# Patient Record
Sex: Male | Born: 1955 | Race: White | Hispanic: No | Marital: Single | Smoking: Former smoker
Health system: Southern US, Community
[De-identification: ages and names within clinical notes are randomized; demographics above are authoritative.]

## PROBLEM LIST (undated history)

## (undated) DIAGNOSIS — I509 Heart failure, unspecified: Secondary | ICD-10-CM

## (undated) DIAGNOSIS — Z87442 Personal history of urinary calculi: Secondary | ICD-10-CM

## (undated) DIAGNOSIS — Z9081 Acquired absence of spleen: Secondary | ICD-10-CM

## (undated) DIAGNOSIS — K759 Inflammatory liver disease, unspecified: Secondary | ICD-10-CM

## (undated) DIAGNOSIS — M1990 Unspecified osteoarthritis, unspecified site: Secondary | ICD-10-CM

## (undated) DIAGNOSIS — N189 Chronic kidney disease, unspecified: Secondary | ICD-10-CM

## (undated) DIAGNOSIS — K219 Gastro-esophageal reflux disease without esophagitis: Secondary | ICD-10-CM

## (undated) DIAGNOSIS — M549 Dorsalgia, unspecified: Secondary | ICD-10-CM

## (undated) DIAGNOSIS — M722 Plantar fascial fibromatosis: Secondary | ICD-10-CM

## (undated) DIAGNOSIS — I10 Essential (primary) hypertension: Secondary | ICD-10-CM

## (undated) DIAGNOSIS — I739 Peripheral vascular disease, unspecified: Secondary | ICD-10-CM

## (undated) DIAGNOSIS — E7800 Pure hypercholesterolemia, unspecified: Secondary | ICD-10-CM

## (undated) HISTORY — PX: HERNIA REPAIR: SHX51

## (undated) HISTORY — PX: SPLENECTOMY: SUR1306

## (undated) HISTORY — PX: JOINT REPLACEMENT: SHX530

---

## 2007-07-13 ENCOUNTER — Other Ambulatory Visit: Payer: Self-pay

## 2007-07-13 ENCOUNTER — Emergency Department: Payer: Self-pay | Admitting: Emergency Medicine

## 2008-09-11 ENCOUNTER — Emergency Department: Payer: Self-pay | Admitting: Emergency Medicine

## 2011-06-18 ENCOUNTER — Ambulatory Visit: Payer: Self-pay | Admitting: Surgery

## 2011-06-18 LAB — BASIC METABOLIC PANEL
Anion Gap: 10 (ref 7–16)
Calcium, Total: 9.1 mg/dL (ref 8.5–10.1)
Chloride: 97 mmol/L — ABNORMAL LOW (ref 98–107)
Co2: 31 mmol/L (ref 21–32)
Creatinine: 0.99 mg/dL (ref 0.60–1.30)
EGFR (African American): >60
EGFR (Non-African Amer.): >60
Osmolality: 276 (ref 275–301)

## 2011-06-18 LAB — CBC WITH DIFFERENTIAL/PLATELET
Basophil #: 0.2 10*3/uL — ABNORMAL HIGH (ref 0.0–0.1)
Basophil %: 2.3 %
Eosinophil %: 4.9 %
HGB: 15.2 g/dL (ref 13.0–18.0)
Lymphocyte %: 42.5 %
MCH: 30.6 pg (ref 26.0–34.0)
MCHC: 33.5 g/dL (ref 32.0–36.0)
MCV: 91 fL (ref 80–100)
Monocyte #: 0.9 10*3/uL — ABNORMAL HIGH (ref 0.0–0.7)
Monocyte %: 9.1 %

## 2011-06-25 ENCOUNTER — Ambulatory Visit: Payer: Self-pay | Admitting: Surgery

## 2014-09-05 NOTE — Op Note (Signed)
PATIENT NAME:  URIAN, DIBERARDINO MR#:  [id number] DATE OF BIRTH:  Apr 10, 1956  DATE OF PROCEDURE:  06/25/2011  PREOPERATIVE DIAGNOSIS: Symptomatic ventral hernia.   POSTOPERATIVE DIAGNOSIS: Symptomatic ventral hernia.    PROCEDURE PERFORMED: Ventral hernia repair with mesh 6.4 cm Ventralex dual mesh.   SURGEON: Natale Lay, M.D.   ANESTHESIA: General with local.   FINDINGS: 2 x 3 cm fascial defect with incarcerated preperitoneal fat.   DESCRIPTION OF PROCEDURE: With the patient in the supine position, general endotracheal anesthesia was induced. The patient's abdomen was clipped of hair preoperatively, prepped and draped with ChloraPrep followed by Collier Flowers. Perioperative antibiotics and deep vein thrombosis prophylaxis being administered. Time-out procedure was observed. The hernia was centered around the umbilicus. A vertical incision was fashioned to the right of the umbilicus with a scalpel. The hernia sac was immediately identified and reduced back into the abdomen. Circumferentially on the anterior surface of the fascia, the fascia was separated from the subcutaneous fat. The hernia defect measured 2 x 3 cm. A 6.4 cm oval Ventralex patch was brought into the field and inserted into the hernia defect below the fascia and secured with multiple mattress-type sutures of 0 Vicryl suture. The fascia easily reapproximated to the midline in a transverse orientation with figure-of-eight #0 Vicryl sutures. The wound was irrigated with GU irrigant. Hemostasis was obtained with point cautery. The deep layer of the wound was obliterated utilizing 2-0 Vicryl suture. The umbilical dermis was then reattached to the fascia with a figure-of-eight 2-0 Vicryl suture. Deep dermal inverted 3-0 Vicryls were then placed. Skin stapler was used to reapproximate skin edges. Sterile occlusive compressive dressing was placed followed by an abdominal binder.  ____________________________ Redge Gainer. Egbert Garibaldi, MD mab:ap D: 06/25/2011  08:42:56 ET              T: 06/25/2011 09:47:43 ET                 JOB#: [id number] Kolbe Delmonaco A Cierria Height MD ELECTRONICALLY SIGNED 06/25/2011 17:05

## 2015-01-06 ENCOUNTER — Inpatient Hospital Stay: Admission: RE | Admit: 2015-01-06 | Payer: Self-pay | Source: Ambulatory Visit

## 2015-01-12 ENCOUNTER — Encounter
Admission: RE | Admit: 2015-01-12 | Discharge: 2015-01-12 | Disposition: A | Discharge status: Home / Self Care | Payer: Medicaid Other | Source: Ambulatory Visit | Attending: Surgery | Admitting: Surgery

## 2015-01-12 DIAGNOSIS — Z01810 Encounter for preprocedural cardiovascular examination: Secondary | ICD-10-CM | POA: Diagnosis present

## 2015-01-12 DIAGNOSIS — Z01812 Encounter for preprocedural laboratory examination: Secondary | ICD-10-CM | POA: Insufficient documentation

## 2015-01-12 HISTORY — DX: Essential (primary) hypertension: I10

## 2015-01-12 HISTORY — DX: Gastro-esophageal reflux disease without esophagitis: K21.9

## 2015-01-12 HISTORY — DX: Plantar fascial fibromatosis: M72.2

## 2015-01-12 HISTORY — DX: Inflammatory liver disease, unspecified: K75.9

## 2015-01-12 HISTORY — DX: Acquired absence of spleen: Z90.81

## 2015-01-12 HISTORY — DX: Pure hypercholesterolemia, unspecified: E78.00

## 2015-01-12 HISTORY — DX: Dorsalgia, unspecified: M54.9

## 2015-01-12 LAB — PROTIME-INR
INR: 1.02
Prothrombin Time: 13.6 seconds (ref 11.4–15.0)

## 2015-01-12 NOTE — Patient Instructions (Signed)
  Your procedure is scheduled on: Friday 01/14/2015 Report to Day Surgery. 2nd floor medical mall entrance To find out your arrival time please call [phone] between 1PM - 3PM on Thursday 01/13/2015.  Remember: Instructions that are not followed completely may result in serious medical risk, up to and including death, or upon the discretion of your surgeon and anesthesiologist your surgery may need to be rescheduled.    __X__ 1. Do not eat food or drink liquids after midnight. No gum chewing or hard candies.     __X_ 2. No Alcohol for 24 hours before or after surgery.   ____ 3. Bring all medications with you on the day of surgery if instructed.    __X__ 4. Notify your doctor if there is any change in your medical condition     (cold, fever, infections).     Do not wear jewelry, make-up, hairpins, clips or nail polish.  Do not wear lotions, powders, or perfumes.   Do not shave 48 hours prior to surgery. Men may shave face and neck.  Do not bring valuables to the hospital.    [facility] is not responsible for any belongings or valuables.               Contacts, dentures or bridgework may not be worn into surgery.  Leave your suitcase in the car. After surgery it may be brought to your room.  For patients admitted to the hospital, discharge time is determined by your                treatment team.   Patients discharged the day of surgery will not be allowed to drive home.   Please read over the following fact sheets that you were given:   Surgical Site Infection Prevention   __X__ Take these medicines the morning of surgery with A SIP OF WATER:    1. OMEPRAZOLE  2. OXYCODONE  3. METHADONE  4.  5.  6.  ____ Fleet Enema (as directed)   __X__ Use CHG Soap as directed  ____ Use inhalers on the day of surgery  ____ Stop metformin 2 days prior to surgery    ____ Take 1/2 of usual insulin dose the night before surgery and none on the morning of surgery.   ____ Stop  Coumadin/Plavix/aspirin on   ____ Stop Anti-inflammatories on    ____ Stop supplements until after surgery.    ____ Bring C-Pap to the hospital.

## 2015-01-14 ENCOUNTER — Ambulatory Visit: Admission: RE | Admit: 2015-01-14 | Payer: Medicaid Other | Source: Ambulatory Visit | Admitting: Surgery

## 2015-01-14 ENCOUNTER — Encounter: Admission: RE | Payer: Self-pay | Source: Ambulatory Visit

## 2015-01-14 SURGERY — EXCISION MASS
Anesthesia: Choice | Laterality: Right

## 2015-12-28 DIAGNOSIS — Z6841 Body Mass Index (BMI) 40.0 and over, adult: Secondary | ICD-10-CM

## 2016-03-08 ENCOUNTER — Ambulatory Visit: Payer: Medicaid Other

## 2016-06-30 ENCOUNTER — Emergency Department
Admission: EM | Admit: 2016-06-30 | Discharge: 2016-06-30 | Disposition: A | Discharge status: Home / Self Care | Payer: Medicaid Other | Attending: Emergency Medicine | Admitting: Emergency Medicine

## 2016-06-30 ENCOUNTER — Encounter: Payer: Self-pay | Admitting: Emergency Medicine

## 2016-06-30 DIAGNOSIS — M79672 Pain in left foot: Secondary | ICD-10-CM | POA: Insufficient documentation

## 2016-06-30 DIAGNOSIS — M545 Low back pain, unspecified: Secondary | ICD-10-CM

## 2016-06-30 DIAGNOSIS — Z79899 Other long term (current) drug therapy: Secondary | ICD-10-CM | POA: Diagnosis not present

## 2016-06-30 DIAGNOSIS — G8929 Other chronic pain: Secondary | ICD-10-CM | POA: Diagnosis not present

## 2016-06-30 DIAGNOSIS — F17200 Nicotine dependence, unspecified, uncomplicated: Secondary | ICD-10-CM | POA: Insufficient documentation

## 2016-06-30 DIAGNOSIS — I10 Essential (primary) hypertension: Secondary | ICD-10-CM | POA: Insufficient documentation

## 2016-06-30 DIAGNOSIS — M79671 Pain in right foot: Secondary | ICD-10-CM | POA: Diagnosis not present

## 2016-06-30 MED ORDER — DEXAMETHASONE SODIUM PHOSPHATE 10 MG/ML IJ SOLN
10.0000 mg | Freq: Once | INTRAMUSCULAR | Status: AC
  Administered 2016-06-30: 10 mg via INTRAMUSCULAR

## 2016-06-30 MED ORDER — DEXAMETHASONE SODIUM PHOSPHATE 10 MG/ML IJ SOLN
INTRAMUSCULAR | Status: AC
  Administered 2016-06-30: 10 mg via INTRAMUSCULAR
  Filled 2016-06-30: qty 1

## 2016-06-30 MED ORDER — PREDNISONE 10 MG PO TABS
ORAL_TABLET | ORAL | 0 refills | Status: DC
Start: 2016-06-30 — End: 2016-11-30
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
## 2016-06-30 NOTE — ED Notes (Signed)

## 2016-06-30 NOTE — Discharge Instructions (Signed)
You will need to call your doctor for any further pain medication. Begin taking prednisone as directed starting with 6 pills and tapering down to 1 on the last day. Continue your regular medication as prescribed. Keep your prescription to be filled on February 26 and take to your local pharmacy.
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
## 2016-07-04 ENCOUNTER — Emergency Department: Payer: Medicaid Other

## 2016-07-04 ENCOUNTER — Emergency Department
Admission: EM | Admit: 2016-07-04 | Discharge: 2016-07-04 | Discharge status: Against Medical Advice | Payer: Medicaid Other | Attending: Student in an Organized Health Care Education/Training Program | Admitting: Student in an Organized Health Care Education/Training Program

## 2016-07-04 ENCOUNTER — Encounter: Payer: Self-pay | Admitting: Intensive Care

## 2016-07-04 DIAGNOSIS — I10 Essential (primary) hypertension: Secondary | ICD-10-CM | POA: Diagnosis not present

## 2016-07-04 DIAGNOSIS — R1084 Generalized abdominal pain: Secondary | ICD-10-CM | POA: Diagnosis present

## 2016-07-04 DIAGNOSIS — F17290 Nicotine dependence, other tobacco product, uncomplicated: Secondary | ICD-10-CM | POA: Insufficient documentation

## 2016-07-04 DIAGNOSIS — F1110 Opioid abuse, uncomplicated: Secondary | ICD-10-CM | POA: Insufficient documentation

## 2016-07-04 DIAGNOSIS — Z79899 Other long term (current) drug therapy: Secondary | ICD-10-CM | POA: Diagnosis not present

## 2016-07-04 DIAGNOSIS — R109 Unspecified abdominal pain: Secondary | ICD-10-CM

## 2016-07-04 LAB — CBC WITH DIFFERENTIAL/PLATELET
BASOS ABS: 0.1 10*3/uL (ref 0–0.1)
Basophils Relative: 2 %
EOS PCT: 0 %
Eosinophils Absolute: 0 10*3/uL (ref 0–0.7)
HEMATOCRIT: 45.6 % (ref 40.0–52.0)
Hemoglobin: 15.6 g/dL (ref 13.0–18.0)
LYMPHS PCT: 39 %
Lymphs Abs: 3.7 10*3/uL — ABNORMAL HIGH (ref 1.0–3.6)
MCH: 30.3 pg (ref 26.0–34.0)
MCHC: 34.3 g/dL (ref 32.0–36.0)
MCV: 88.5 fL (ref 80.0–100.0)
Monocytes Absolute: 1 10*3/uL (ref 0.2–1.0)
Monocytes Relative: 11 %
NEUTROS ABS: 4.8 10*3/uL (ref 1.4–6.5)
Neutrophils Relative %: 48 %
PLATELETS: 368 10*3/uL (ref 150–440)
RBC: 5.15 MIL/uL (ref 4.40–5.90)
RDW: 14 % (ref 11.5–14.5)
WBC: 9.7 10*3/uL (ref 3.8–10.6)

## 2016-07-04 LAB — COMPREHENSIVE METABOLIC PANEL
ALT: 92 U/L — AB (ref 17–63)
AST: 93 U/L — AB (ref 15–41)
Albumin: 4.1 g/dL (ref 3.5–5.0)
Alkaline Phosphatase: 61 U/L (ref 38–126)
Anion gap: 20 — ABNORMAL HIGH (ref 5–15)
BUN: 20 mg/dL (ref 6–20)
CALCIUM: 9 mg/dL (ref 8.9–10.3)
CHLORIDE: 94 mmol/L — AB (ref 101–111)
CO2: 19 mmol/L — ABNORMAL LOW (ref 22–32)
Creatinine, Ser: 0.78 mg/dL (ref 0.61–1.24)
GFR calc Af Amer: >60 mL/min (ref >60)
Glucose, Bld: 101 mg/dL — ABNORMAL HIGH (ref 65–99)
Potassium: 3.6 mmol/L (ref 3.5–5.1)
Sodium: 133 mmol/L — ABNORMAL LOW (ref 135–145)
TOTAL PROTEIN: 7.5 g/dL (ref 6.5–8.1)
Total Bilirubin: 0.8 mg/dL (ref 0.3–1.2)

## 2016-07-04 LAB — LIPASE, BLOOD: Lipase: 86 U/L — ABNORMAL HIGH (ref 11–51)

## 2016-07-04 MED ORDER — SODIUM CHLORIDE 0.9 % IV BOLUS (SEPSIS)
500.0000 mL | Freq: Once | INTRAVENOUS | Status: AC
  Administered 2016-07-04: 500 mL via INTRAVENOUS

## 2016-07-04 MED ORDER — PROMETHAZINE HCL 25 MG/ML IJ SOLN
12.5000 mg | Freq: Once | INTRAMUSCULAR | Status: AC
  Administered 2016-07-04: 12.5 mg via INTRAVENOUS
  Filled 2016-07-04: qty 1

## 2016-07-04 MED ORDER — MORPHINE SULFATE (PF) 4 MG/ML IV SOLN
8.0000 mg | INTRAVENOUS | Status: DC | PRN
Start: 2016-07-04 — End: 2016-07-04
  Administered 2016-07-04: 8 mg via INTRAVENOUS
  Filled 2016-07-04: qty 2

## 2016-07-04 MED ORDER — HALOPERIDOL LACTATE 5 MG/ML IJ SOLN
10.0000 mg | Freq: Once | INTRAMUSCULAR | Status: AC
  Administered 2016-07-04: 10 mg via INTRAMUSCULAR
  Filled 2016-07-04: qty 2

## 2016-07-04 NOTE — ED Triage Notes (Signed)
PAtient arrived by EMS from home for c/o L sided flank pain since 11am. HX kidney stones and pt reports this feels the same. EMS gave fentanyl and 15mg  toradol. EMS vitals b/p 158/88, HR 90, O297, blood sugar 95. A&O x3. Pt in fetal position in bed c/o 10 out of 10 pain

## 2016-07-04 NOTE — ED Notes (Signed)
AAOx3. MAE equally and strong.  Patient states he was "confused earlier when he said his left side  / low back was hurting, and that his pain is actually to his right lower back / flank".

## 2016-07-04 NOTE — ED Provider Notes (Signed)
[facility] Emergency Department Provider Note    First MD Initiated Contact with Patient 07/04/16 1034     (approximate)  I have reviewed the triage vital signs and the nursing notes.   HISTORY  Chief Complaint Flank Pain    HPI Joshua Petersen is a 61 y.o. male with a history of chronic back pain with extensive history of narcotic abuse and addiction presenting today with 24 hours of generalized flank pain that he states is consistent with kidney stones that he had in 2011. Denies any nausea or vomiting. No chest pain. States it hurts every position. No dysuria or hematuria. He is unable to characterize how this is different from his chronic back pain. No new numbness or tingling. No reported trauma.  On review of patient's medical record he has been in by multiple practitioners in pain clinic and being weaned off of 30 mg every 8 oxycodone. Also having significant family stressors is currently unemployed due to a chronic debility due to chronic back pain.  [place] PMP:   1/26 - 120 tabs of 30mg  Roxicodone filled 1/26 - 30 tabs 10mg  Methadone   From Office visit W/ Dr. Zada Finders 06/29/16 Chronic bilateral low back pain with bilateral sciatica The patient reports that he has concerned about the medications that recently were renewed for him. Currently on methadone 30 mg that he is supposed to take 3 times a day as reported by the patient but he said that he has only received a total of 30 tablets that will last him only once a day, in addition he is on oxycodone 30 mg that he also takes every 6 hours as needed. The patient was encouraged to contact his pain specialist to clarify this. Unable to tell by the most recent visit to the pain clinic about the changes on his medications. He is also 1 to get surgery for his low back pain. He said in the past he was told that this may or may not help with the back pain, he said he is willing to try it. A referral has been  requested. To help with the pain as he reports shortness of medications a prescription for naproxen to take twice a day was sent to the pharmacy. He was also reminded to make appointments to discuss other medical issues, particularly make an appointment to get a physical. - Ambulatory Referral to Neurology - naproxen (NAPROSYN) 500 MG tablet; Take 1 tablet (500 mg total) by mouth 2 (two) times daily with meals.       Past Medical History:  Diagnosis Date  . Back pain   . GERD (gastroesophageal reflux disease)   . H/O splenectomy   . Hepatitis    hepatitis C  . High cholesterol   . Hypertension   . Plantar fasciitis    No family history on file. Past Surgical History:  Procedure Laterality Date  . HERNIA REPAIR     umbilical   There are no active problems to display for this patient.     Prior to Admission medications   Medication Sig Start Date End Date Taking? Authorizing Provider  albuterol (PROVENTIL HFA;VENTOLIN HFA) 108 (90 BASE) MCG/ACT inhaler Inhale 2 puffs into the lungs every 6 (six) hours as needed for wheezing or shortness of breath.    Historical Provider, MD  atorvastatin (LIPITOR) 10 MG tablet Take 10 mg by mouth at bedtime.    Historical Provider, MD  lisinopril-hydrochlorothiazide (PRINZIDE,ZESTORETIC) 20-12.5 MG per tablet Take 1  tablet by mouth daily.    Historical Provider, MD  methadone (DOLOPHINE) 10 MG tablet Take 40 mg by mouth every 8 (eight) hours.    Historical Provider, MD  omeprazole (PRILOSEC) 20 MG capsule Take 20 mg by mouth daily.    Historical Provider, MD  oxycodone (ROXICODONE) 30 MG immediate release tablet Take 60 mg by mouth 3 (three) times daily.    Historical Provider, MD  predniSONE (DELTASONE) 10 MG tablet Take 6 tablets  today, on day 2 take 5 tablets, day 3 take 4 tablets, day 4 take 3 tablets, day 5 take  2 tablets and 1 tablet the last day 06/30/16   Tommi Rumps, PA-C    Allergies Patient has no known  allergies.    Social History Social History  Substance Use Topics  . Smoking status: Former Games developer  . Smokeless tobacco: Current User  . Alcohol use Yes     Comment: occasional    Review of Systems Patient denies headaches, rhinorrhea, blurry vision, numbness, shortness of breath, chest pain, edema, cough, abdominal pain, nausea, vomiting, diarrhea, dysuria, fevers, rashes or hallucinations unless otherwise stated above in HPI. ____________________________________________   PHYSICAL EXAM:  VITAL SIGNS: Vitals:   07/04/16 1040  BP: (!) 169/136  Pulse: (!) 115  Resp: 20  Temp: 98.2 F (36.8 C)    Constitutional: Alert and oriented. Uncomfortable appearing thrashing around in bed while in room but in acute distress when no one else in the room Eyes: Conjunctivae are normal. PERRL. EOMI. Head: Atraumatic. Nose: No congestion/rhinnorhea. Mouth/Throat: Mucous membranes are moist.  Oropharynx non-erythematous. Neck: No stridor. Painless ROM. No cervical spine tenderness to palpation Hematological/Lymphatic/Immunilogical: No cervical lymphadenopathy. Cardiovascular: Normal rate, regular rhythm. Grossly normal heart sounds.  Good peripheral circulation. Respiratory: Normal respiratory effort.  No retractions. Lungs CTAB. Gastrointestinal: Soft and nontender. No distention. No abdominal bruits. No CVA tenderness. Genitourinary:  Musculoskeletal: No lower extremity tenderness nor edema.  No joint effusions. Neurologic:  Normal speech and language. No gross focal neurologic deficits are appreciated. No gait instability. Skin:  Skin is warm, dry and intact. No rash noted.  ____________________________________________   LABS (all labs ordered are listed, but only abnormal results are displayed)  Results for orders placed or performed during the hospital encounter of 07/04/16 (from the past 24 hour(s))  CBC with Differential/Platelet     Status: Abnormal   Collection Time:  07/04/16 10:56 AM  Result Value Ref Range   WBC 9.7 3.8 - 10.6 K/uL   RBC 5.15 4.40 - 5.90 MIL/uL   Hemoglobin 15.6 13.0 - 18.0 g/dL   HCT 16.1 09.6 - 04.5 %   MCV 88.5 80.0 - 100.0 fL   MCH 30.3 26.0 - 34.0 pg   MCHC 34.3 32.0 - 36.0 g/dL   RDW 40.9 81.1 - 91.4 %   Platelets 368 150 - 440 K/uL   Neutrophils Relative % 48 %   Neutro Abs 4.8 1.4 - 6.5 K/uL   Lymphocytes Relative 39 %   Lymphs Abs 3.7 (H) 1.0 - 3.6 K/uL   Monocytes Relative 11 %   Monocytes Absolute 1.0 0.2 - 1.0 K/uL   Eosinophils Relative 0 %   Eosinophils Absolute 0.0 0 - 0.7 K/uL   Basophils Relative 2 %   Basophils Absolute 0.1 0 - 0.1 K/uL  Comprehensive metabolic panel     Status: Abnormal   Collection Time: 07/04/16 10:56 AM  Result Value Ref Range   Sodium 133 (L) 135 - 145 mmol/L  Potassium 3.6 3.5 - 5.1 mmol/L   Chloride 94 (L) 101 - 111 mmol/L   CO2 19 (L) 22 - 32 mmol/L   Glucose, Bld 101 (H) 65 - 99 mg/dL   BUN 20 6 - 20 mg/dL   Creatinine, Ser 8.26 0.61 - 1.24 mg/dL   Calcium 9.0 8.9 - 41.5 mg/dL   Total Protein 7.5 6.5 - 8.1 g/dL   Albumin 4.1 3.5 - 5.0 g/dL   AST 93 (H) 15 - 41 U/L   ALT 92 (H) 17 - 63 U/L   Alkaline Phosphatase 61 38 - 126 U/L   Total Bilirubin 0.8 0.3 - 1.2 mg/dL   GFR calc non Af Amer >60 >60 mL/min   GFR calc Af Amer >60 >60 mL/min   Anion gap 20 (H) 5 - 15  Lipase, blood     Status: Abnormal   Collection Time: 07/04/16 10:56 AM  Result Value Ref Range   Lipase 86 (H) 11 - 51 U/L   ____________________________________________  ____________________________________________  RADIOLOGY  I personally reviewed all radiographic images ordered to evaluate for the above acute complaints and reviewed radiology reports and findings.  These findings were personally discussed with the patient.  Please see medical record for radiology report.  ____________________________________________   PROCEDURES  Procedure(s) performed:  Procedures    Critical Care performed:  no ____________________________________________   INITIAL IMPRESSION / ASSESSMENT AND PLAN / ED COURSE  Pertinent labs & imaging results that were available during my care of the patient were reviewed by me and considered in my medical decision making (see chart for details).  DDX: stone, AAA, msk strain, chronic back pain  Joshua Petersen is a 61 y.o. who presents to the ED with chronic back pain p/w left flank pain. No fevers, no systemic symptoms.  urinary symptoms. Denies trauma or injury. Afebrile in ED. Exam as above. Flank TTP, otherwise abdominal exam is benign. No peritoneal signs. Possible kidney stone, cystitis, or pyelonephritis.   Checking urine. UA pending  CT Stone with No stone or AAA.  No acute abnormality Clinical picture is not consistent with appendicitis, diverticulitis, pancreatitis, cholecystitis, bowel perforation, aortic dissection, splenic injury or acute abdominal process at this time.    Clinical Course as of Jul 05 1199  Wed Jul 04, 2016  1200 CT imaging without any evidence of acute abnormality. While checking on the patient I noticed the patient was ambulating throughout the hall in no acute distress. He ripped out his IV. Encouraged the patient to stay for further evaluation the patient refused. Patient upset that we're not providing any narcotic pain medication. I said we did provide narcotic medication. To suspect a large component of this is secondary to significant narcotic abuse. Patient otherwise medically stable, well perfused and in no acute distress. Patient left AGAINST MEDICAL ADVICE. Encouraged patient to return should he want one further evaluation.  [PR]    Clinical Course User Index [PR] Willy Eddy, MD     ____________________________________________   FINAL CLINICAL IMPRESSION(S) / ED DIAGNOSES  Final diagnoses:  Left flank pain  Narcotic abuse, continuous      NEW MEDICATIONS STARTED DURING THIS VISIT:  New Prescriptions    No medications on file     Note:  This document was prepared using Dragon voice recognition software and may include unintentional dictation errors.    Willy Eddy, MD 07/04/16 [phone]

## 2016-07-04 NOTE — ED Notes (Signed)
Call light answered. Pt wants something for pain. RN notified.

## 2016-07-04 NOTE — ED Notes (Signed)
Patient pulled out IV and stated "I am leaving".  Multiple staff attempted to stop patient from walking out.  DSD dressing applied to IV site.  Dr. Roxan Hockey alerted to patient's decision to leave AMA.  No further action ordered.  Patient refused to sign AMA form.

## 2016-08-29 ENCOUNTER — Other Ambulatory Visit: Payer: Self-pay | Admitting: Neurological Surgery

## 2016-08-29 DIAGNOSIS — G8929 Other chronic pain: Secondary | ICD-10-CM

## 2016-08-29 DIAGNOSIS — M545 Low back pain: Principal | ICD-10-CM

## 2016-09-07 ENCOUNTER — Ambulatory Visit
Admission: RE | Admit: 2016-09-07 | Discharge: 2016-09-07 | Disposition: A | Discharge status: Home / Self Care | Payer: Medicaid Other | Source: Ambulatory Visit | Attending: Neurological Surgery | Admitting: Neurological Surgery

## 2016-09-07 DIAGNOSIS — G8929 Other chronic pain: Secondary | ICD-10-CM

## 2016-09-07 DIAGNOSIS — M545 Low back pain, unspecified: Secondary | ICD-10-CM

## 2016-09-07 DIAGNOSIS — M5136 Other intervertebral disc degeneration, lumbar region: Secondary | ICD-10-CM | POA: Diagnosis not present

## 2016-11-30 ENCOUNTER — Encounter: Payer: Self-pay | Admitting: *Deleted

## 2016-11-30 ENCOUNTER — Ambulatory Visit
Admission: EM | Admit: 2016-11-30 | Discharge: 2016-11-30 | Disposition: A | Discharge status: Home / Self Care | Payer: Medicaid Other | Attending: Family Medicine | Admitting: Family Medicine

## 2016-11-30 DIAGNOSIS — G8929 Other chronic pain: Secondary | ICD-10-CM | POA: Diagnosis not present

## 2016-11-30 DIAGNOSIS — M940 Chondrocostal junction syndrome [Tietze]: Secondary | ICD-10-CM | POA: Diagnosis not present

## 2016-11-30 DIAGNOSIS — Z87891 Personal history of nicotine dependence: Secondary | ICD-10-CM | POA: Diagnosis not present

## 2016-11-30 DIAGNOSIS — M545 Low back pain: Secondary | ICD-10-CM | POA: Insufficient documentation

## 2016-11-30 DIAGNOSIS — R0789 Other chest pain: Secondary | ICD-10-CM

## 2016-11-30 DIAGNOSIS — R0602 Shortness of breath: Secondary | ICD-10-CM | POA: Diagnosis not present

## 2016-11-30 MED ORDER — PREDNISONE 20 MG PO TABS: ORAL_TABLET | ORAL | 0 refills | Status: DC

## 2016-11-30 MED ORDER — METHOCARBAMOL 500 MG PO TABS: 500.0000 mg | ORAL_TABLET | Freq: Three times a day (TID) | ORAL | 0 refills | Status: DC | PRN

## 2016-11-30 NOTE — ED Triage Notes (Signed)
Left sided chest pain x8 months. States started when PCP changed his BP meds and has persisted since.

## 2016-11-30 NOTE — ED Provider Notes (Signed)
[facility]    CSN: [id number] Arrival date & time: 11/30/16  0806     History   Chief Complaint Chief Complaint  Patient presents with  . Chest Pain  . Shortness of Breath    HPI Joshua Petersen is a 61 y.o. male.   61 yo male with c/o left sided chest pain "sharp",  "for 8 months", "since they changed my pain medication". Patient c/o chronic pain problems including low back, feet, chest and "pain not controlled". Patient saw his PCP 2 days ago and also states he has an appointment with his pain specialist on Monday (in 3 days).   (Recent PCP and pain specialist notes reviewed in Epic Care)   The history is provided by the patient.  Chest Pain  Associated symptoms: shortness of breath   Shortness of Breath  Associated symptoms: chest pain     Past Medical History:  Diagnosis Date  . Back pain   . GERD (gastroesophageal reflux disease)   . H/O splenectomy   . Hepatitis    hepatitis C  . High cholesterol   . Hypertension   . Plantar fasciitis     There are no active problems to display for this patient.   Past Surgical History:  Procedure Laterality Date  . HERNIA REPAIR     umbilical       Home Medications    Prior to Admission medications   Medication Sig Start Date End Date Taking? Authorizing Provider  lisinopril-hydrochlorothiazide (PRINZIDE,ZESTORETIC) 20-12.5 MG per tablet Take 1 tablet by mouth daily.   Yes [provider]  omeprazole (PRILOSEC) 20 MG capsule Take 20 mg by mouth daily.   Yes [provider]  albuterol (PROVENTIL HFA;VENTOLIN HFA) 108 (90 BASE) MCG/ACT inhaler Inhale 2 puffs into the lungs every 6 (six) hours as needed for wheezing or shortness of breath.    [provider]  methadone (DOLOPHINE) 10 MG tablet Take 10 mg by mouth every 8 (eight) hours.     [provider]  methocarbamol (ROBAXIN) 500 MG tablet Take 1 tablet (500 mg total) by mouth every 8 (eight) hours as needed for  muscle spasms. 11/30/16   Payton Mccallum, MD  oxycodone (ROXICODONE) 30 MG immediate release tablet Take 30 mg by mouth every 6 (six) hours as needed for pain.     [provider]  predniSONE (DELTASONE) 20 MG tablet 3 tabs po qd x 2 days, then 2 tabs po qd x 3 days, then 1 tab po qd x 3 days, then half a tab po qd x 2 days 11/30/16   Payton Mccallum, MD    Family History History reviewed. No pertinent family history.  Social History Social History  Substance Use Topics  . Smoking status: Former Games developer  . Smokeless tobacco: Current User  . Alcohol use Yes     Comment: occasional     Allergies   Patient has no known allergies.   Review of Systems Review of Systems  Respiratory: Positive for shortness of breath.   Cardiovascular: Positive for chest pain.     Physical Exam Triage Vital Signs ED Triage Vitals  Enc Vitals Group     BP 11/30/16 0812 (!) 157/85     Pulse Rate 11/30/16 0812 (!) 116     Resp 11/30/16 0812 20     Temp 11/30/16 0812 97.6 F (36.4 C)     Temp Source 11/30/16 0812 Oral     SpO2 11/30/16 0812 97 %  Weight 11/30/16 0813 292 lb (132.5 kg)     Height 11/30/16 0813 5\' 8"  (1.727 m)     Head Circumference --      Peak Flow --      Pain Score 11/30/16 0813 2     Pain Loc --      Pain Edu? --      Excl. in GC? --    No data found.   Updated Vital Signs BP (!) 157/85 (BP Location: Left Arm)   Pulse (!) 116   Temp 97.6 F (36.4 C) (Oral)   Resp 20   Ht 5\' 8"  (1.727 m)   Wt 292 lb (132.5 kg)   SpO2 97%   BMI 44.40 kg/m   Visual Acuity Right Eye Distance:   Left Eye Distance:   Bilateral Distance:    Right Eye Near:   Left Eye Near:    Bilateral Near:     Physical Exam  Constitutional: He is oriented to person, place, and time. He appears well-developed and well-nourished. No distress.  HENT:  Head: Normocephalic and atraumatic.  Cardiovascular: Regular rhythm, normal heart sounds and intact distal pulses.  Tachycardia  present.   No murmur heard. Pulmonary/Chest: Effort normal and breath sounds normal. No respiratory distress. He has no wheezes. He has no rales. He exhibits tenderness (reproducible chest wall tenderness to palpation over the left chest wall).  Abdominal: Soft. Bowel sounds are normal. He exhibits no distension and no mass. There is no tenderness. There is no rebound and no guarding.  obese  Neurological: He is alert and oriented to person, place, and time.  Skin: No rash noted. He is not diaphoretic.  Nursing note and vitals reviewed.    UC Treatments / Results  Labs (all labs ordered are listed, but only abnormal results are displayed) Labs Reviewed - No data to display  EKG  EKG Interpretation None       Radiology No results found.  Procedures .EKG Date/Time: 11/30/2016 9:05 AM Performed by: Payton Mccallum Authorized by: Payton Mccallum   ECG reviewed by ED Physician in the absence of a cardiologist: yes   Previous ECG:    Previous ECG:  Unavailable Interpretation:    Interpretation: normal   Rate:    ECG rate assessment: tachycardic   Rhythm:    Rhythm: sinus tachycardia   Ectopy:    Ectopy: none   QRS:    QRS axis:  Normal Conduction:    Conduction: normal   ST segments:    ST segments:  Normal T waves:    T waves: normal     (including critical care time)  Medications Ordered in UC Medications - No data to display   Initial Impression / Assessment and Plan / UC Course  I have reviewed the triage vital signs and the nursing notes.  Pertinent labs & imaging results that were available during my care of the patient were reviewed by me and considered in my medical decision making (see chart for details).       Final Clinical Impressions(s) / UC Diagnoses   Final diagnoses:  Chest wall pain  Costochondritis    New Prescriptions Discharge Medication List as of 11/30/2016  8:40 AM    START taking these medications   Details  methocarbamol  (ROBAXIN) 500 MG tablet Take 1 tablet (500 mg total) by mouth every 8 (eight) hours as needed for muscle spasms., Starting Fri 11/30/2016, Normal       1. ekg results and  diagnosis reviewed with patient 2. rx as per orders above; reviewed possible side effects, interactions, risks and benefits; rx for prednisone taper as well  3. Recommend supportive treatment with ice to area  4. Discussed with a patient he already is under pain management with a pain specialist and recommend he keep his follow up appointment with his pain specialist on Monday to discuss his concerns  5. Follow-up with pain specialist   Payton Mccallum, MD 11/30/16 [phone]

## 2017-03-05 ENCOUNTER — Ambulatory Visit
Admission: RE | Admit: 2017-03-05 | Discharge: 2017-03-05 | Disposition: A | Discharge status: Home / Self Care | Payer: Medicaid Other | Source: Ambulatory Visit | Attending: Surgery | Admitting: Surgery

## 2017-03-05 ENCOUNTER — Encounter
Admission: RE | Admit: 2017-03-05 | Discharge: 2017-03-05 | Disposition: A | Discharge status: Home / Self Care | Payer: Medicaid Other | Source: Ambulatory Visit | Attending: Surgery | Admitting: Surgery

## 2017-03-05 DIAGNOSIS — R9431 Abnormal electrocardiogram [ECG] [EKG]: Secondary | ICD-10-CM | POA: Insufficient documentation

## 2017-03-05 DIAGNOSIS — Z01812 Encounter for preprocedural laboratory examination: Secondary | ICD-10-CM | POA: Insufficient documentation

## 2017-03-05 DIAGNOSIS — M1612 Unilateral primary osteoarthritis, left hip: Secondary | ICD-10-CM | POA: Diagnosis not present

## 2017-03-05 DIAGNOSIS — Z01818 Encounter for other preprocedural examination: Secondary | ICD-10-CM | POA: Insufficient documentation

## 2017-03-05 DIAGNOSIS — J449 Chronic obstructive pulmonary disease, unspecified: Secondary | ICD-10-CM | POA: Insufficient documentation

## 2017-03-05 DIAGNOSIS — I10 Essential (primary) hypertension: Secondary | ICD-10-CM

## 2017-03-05 DIAGNOSIS — Z0183 Encounter for blood typing: Secondary | ICD-10-CM | POA: Insufficient documentation

## 2017-03-05 HISTORY — DX: Unspecified osteoarthritis, unspecified site: M19.90

## 2017-03-05 HISTORY — DX: Personal history of urinary calculi: Z87.442

## 2017-03-05 LAB — URINALYSIS, COMPLETE (UACMP) WITH MICROSCOPIC
BILIRUBIN URINE: NEGATIVE
Bacteria, UA: NONE SEEN
Glucose, UA: NEGATIVE mg/dL
HGB URINE DIPSTICK: NEGATIVE
KETONES UR: NEGATIVE mg/dL
LEUKOCYTES UA: NEGATIVE
NITRITE: NEGATIVE
PROTEIN: NEGATIVE mg/dL
Specific Gravity, Urine: 1.012 (ref 1.005–1.030)
Squamous Epithelial / LPF: NONE SEEN
pH: 6 (ref 5.0–8.0)

## 2017-03-05 LAB — BASIC METABOLIC PANEL
Anion gap: 10 (ref 5–15)
BUN: 11 mg/dL (ref 6–20)
CALCIUM: 9.4 mg/dL (ref 8.9–10.3)
CO2: 30 mmol/L (ref 22–32)
Chloride: 97 mmol/L — ABNORMAL LOW (ref 101–111)
Creatinine, Ser: 0.88 mg/dL (ref 0.61–1.24)
GFR calc Af Amer: >60 mL/min (ref >60)
GLUCOSE: 104 mg/dL — AB (ref 65–99)
Potassium: 4 mmol/L (ref 3.5–5.1)
SODIUM: 137 mmol/L (ref 135–145)

## 2017-03-05 LAB — TYPE AND SCREEN
ABO/RH(D): O POS
Antibody Screen: NEGATIVE

## 2017-03-05 LAB — CBC
HCT: 43 % (ref 40.0–52.0)
Hemoglobin: 14.6 g/dL (ref 13.0–18.0)
MCH: 30.5 pg (ref 26.0–34.0)
MCHC: 34 g/dL (ref 32.0–36.0)
MCV: 89.5 fL (ref 80.0–100.0)
PLATELETS: 355 10*3/uL (ref 150–440)
RBC: 4.81 MIL/uL (ref 4.40–5.90)
RDW: 12.9 % (ref 11.5–14.5)
WBC: 10.9 10*3/uL — AB (ref 3.8–10.6)

## 2017-03-05 LAB — PROTIME-INR
INR: 0.92
PROTHROMBIN TIME: 12.3 s (ref 11.4–15.2)

## 2017-03-05 LAB — SURGICAL PCR SCREEN
MRSA, PCR: NEGATIVE
Staphylococcus aureus: NEGATIVE

## 2017-03-05 NOTE — Patient Instructions (Signed)
Your procedure is scheduled on:March 12, 2017 (TUESDAY ) Report to Same Day Surgery.([facility] ) SECOND FLOOR To find out your arrival time please call [phone] between 1PM - 3PM on March 11, 2017 [facility]).  Remember: Instructions that are not followed completely may result in serious medical risk, up to and including death, or upon the discretion of your surgeon and anesthesiologist your surgery may need to be rescheduled.     _X__ 1. Do not eat food after midnight the night before your procedure.                 No gum chewing or hard candies. You may drink clear liquids up to 2 hours                 before you are scheduled to arrive for your surgery- DO not drink clear                 liquids within 2 hours of the start of your surgery.                 Clear Liquids include:  water, apple juice without pulp, clear carbohydrate                 drink such as Clearfast of Gartorade, Black Coffee or Tea (Do not add                 anything to coffee or tea).     _X__ 2.  No Alcohol for 24 hours before or after surgery.   _X__ 3.  Do Not Smoke or use e-cigarettes For 24 Hours Prior to Your Surgery.                 Do not use any chewable tobacco products for at least 6 hours prior to                 surgery.  ____  4.  Bring all medications with you on the day of surgery if instructed.   _x___  5.  Notify your doctor if there is any change in your medical condition      (cold, fever, infections).     Do not wear jewelry, make-up, hairpins, clips or nail polish. Do not wear lotions, powders, or perfumes.  Do not shave 48 hours prior to surgery. Men may shave face and neck. Do not bring valuables to the hospital.    [facility] is not responsible for any belongings or valuables.  Contacts, dentures or bridgework may not be worn into surgery. Leave your suitcase in the car. After surgery it may be brought to your room. For patients admitted to the  hospital, discharge time is determined by your treatment team.   Patients discharged the day of surgery will not be allowed to drive home.   Please read over the following fact sheets that you were given:   MRSA Information          __X__ Take these medicines the morning of surgery with A SIP OF WATER:    1. PANTOPRAZOLE  2. PANTOPRAZOLE AT BEDTIME ON OCTOBER  29  3.   4.  5.  6.  ____ Fleet Enema (as directed)   _X___ Use CHG Soap as directed  _X___ Use inhalers on the day of surgery ( USE ALBUTEROL INHALER THE DAY OF SURGERY AND BRING INHALER TO HOSPITAL )  ____ Stop metformin 2 days prior to surgery    ____  Take 1/2 of usual insulin dose the night before surgery. No insulin the morning          of surgery.   _X___ Stop Coumadin/Plavix/aspirin on (NO ASPIRIN )  __X__ Stop Anti-inflammatories on (NO ASPIRIN PRODUCTS, IBUPROFEN, ALEVE, ADVIL MOTRIN ,GOODYS, BC'S ) TYLENOL OR PAIN MEDICATION  OK TO TAKE FOR PAIN IF NEEDED   ____ Stop supplements until after surgery.    ____ Bring C-Pap to the hospital.

## 2017-03-06 NOTE — Pre-Procedure Instructions (Signed)
EKG COMPARED WITH PREVIOUS 

## 2017-03-11 MED ORDER — DEXTROSE 5 % IV SOLN
3.0000 g | Freq: Once | INTRAVENOUS | Status: AC
  Administered 2017-03-12: 3 g via INTRAVENOUS
  Filled 2017-03-11: qty 3

## 2017-03-12 ENCOUNTER — Encounter
Admission: RE | Disposition: A | Discharge status: Home Health | Payer: Self-pay | Source: Ambulatory Visit | Attending: Surgery

## 2017-03-12 ENCOUNTER — Inpatient Hospital Stay: Payer: Medicaid Other | Admitting: Anesthesiology

## 2017-03-12 ENCOUNTER — Inpatient Hospital Stay
Admission: RE | Admit: 2017-03-12 | Discharge: 2017-03-15 | DRG: 470 | Disposition: A | Discharge status: Home Health | Payer: Medicaid Other | Source: Ambulatory Visit | Attending: Surgery | Admitting: Surgery

## 2017-03-12 ENCOUNTER — Inpatient Hospital Stay: Payer: Medicaid Other

## 2017-03-12 ENCOUNTER — Encounter: Payer: Self-pay | Admitting: *Deleted

## 2017-03-12 DIAGNOSIS — Z87891 Personal history of nicotine dependence: Secondary | ICD-10-CM | POA: Diagnosis not present

## 2017-03-12 DIAGNOSIS — E7800 Pure hypercholesterolemia, unspecified: Secondary | ICD-10-CM | POA: Diagnosis present

## 2017-03-12 DIAGNOSIS — Z9081 Acquired absence of spleen: Secondary | ICD-10-CM | POA: Diagnosis not present

## 2017-03-12 DIAGNOSIS — E669 Obesity, unspecified: Secondary | ICD-10-CM | POA: Diagnosis present

## 2017-03-12 DIAGNOSIS — M1612 Unilateral primary osteoarthritis, left hip: Principal | ICD-10-CM | POA: Diagnosis present

## 2017-03-12 DIAGNOSIS — Z6841 Body Mass Index (BMI) 40.0 and over, adult: Secondary | ICD-10-CM

## 2017-03-12 DIAGNOSIS — I10 Essential (primary) hypertension: Secondary | ICD-10-CM | POA: Diagnosis present

## 2017-03-12 DIAGNOSIS — Z87442 Personal history of urinary calculi: Secondary | ICD-10-CM

## 2017-03-12 DIAGNOSIS — K219 Gastro-esophageal reflux disease without esophagitis: Secondary | ICD-10-CM | POA: Diagnosis present

## 2017-03-12 DIAGNOSIS — Z96642 Presence of left artificial hip joint: Secondary | ICD-10-CM

## 2017-03-12 HISTORY — PX: TOTAL HIP ARTHROPLASTY: SHX124

## 2017-03-12 LAB — ABO/RH: ABO/RH(D): O POS

## 2017-03-12 SURGERY — ARTHROPLASTY, HIP, TOTAL,POSTERIOR APPROACH
Anesthesia: General | Site: Hip | Laterality: Left | Wound class: Clean

## 2017-03-12 MED ORDER — ONDANSETRON HCL 4 MG PO TABS: 4.0000 mg | ORAL_TABLET | Freq: Four times a day (QID) | ORAL | Status: DC | PRN

## 2017-03-12 MED ORDER — NEOMYCIN-POLYMYXIN B GU 40-200000 IR SOLN
Status: AC
  Filled 2017-03-12: qty 20

## 2017-03-12 MED ORDER — LORAZEPAM 2 MG/ML IJ SOLN
1.0000 mg | Freq: Once | INTRAMUSCULAR | Status: AC
  Administered 2017-03-12: 1 mg via INTRAVENOUS

## 2017-03-12 MED ORDER — TRANEXAMIC ACID 1000 MG/10ML IV SOLN
INTRAVENOUS | Status: AC | PRN
  Administered 2017-03-12: 1000 mg via TOPICAL

## 2017-03-12 MED ORDER — FENTANYL CITRATE (PF) 250 MCG/5ML IJ SOLN
INTRAMUSCULAR | Status: AC
  Filled 2017-03-12: qty 5

## 2017-03-12 MED ORDER — DEXMEDETOMIDINE HCL IN NACL 80 MCG/20ML IV SOLN
INTRAVENOUS | Status: AC
  Filled 2017-03-12: qty 20

## 2017-03-12 MED ORDER — HYDROCHLOROTHIAZIDE 12.5 MG PO CAPS
12.5000 mg | ORAL_CAPSULE | Freq: Every day | ORAL | Status: DC
  Administered 2017-03-12 – 2017-03-15 (×4): 12.5 mg via ORAL
  Filled 2017-03-12 (×5): qty 1

## 2017-03-12 MED ORDER — PROPOFOL 10 MG/ML IV BOLUS
INTRAVENOUS | Status: DC | PRN
  Administered 2017-03-12: 180 mg via INTRAVENOUS

## 2017-03-12 MED ORDER — FENTANYL CITRATE (PF) 100 MCG/2ML IJ SOLN
50.0000 ug | Freq: Once | INTRAMUSCULAR | Status: AC
  Administered 2017-03-12: 50 ug via INTRAVENOUS

## 2017-03-12 MED ORDER — ONDANSETRON HCL 4 MG/2ML IJ SOLN
INTRAMUSCULAR | Status: DC | PRN
Start: 2017-03-12 — End: 2017-03-12
  Administered 2017-03-12: 4 mg via INTRAVENOUS

## 2017-03-12 MED ORDER — PANTOPRAZOLE SODIUM 40 MG PO TBEC
40.0000 mg | DELAYED_RELEASE_TABLET | Freq: Every day | ORAL | Status: DC
  Administered 2017-03-12 – 2017-03-15 (×4): 40 mg via ORAL
  Filled 2017-03-12 (×4): qty 1

## 2017-03-12 MED ORDER — DIPHENHYDRAMINE HCL 12.5 MG/5ML PO ELIX: 12.5000 mg | ORAL_SOLUTION | ORAL | Status: DC | PRN

## 2017-03-12 MED ORDER — LABETALOL HCL 5 MG/ML IV SOLN
INTRAVENOUS | Status: AC
  Filled 2017-03-12: qty 4

## 2017-03-12 MED ORDER — HYDROMORPHONE HCL 1 MG/ML IJ SOLN
1.0000 mg | INTRAMUSCULAR | Status: DC | PRN
  Administered 2017-03-12 – 2017-03-13 (×6): 2 mg via INTRAVENOUS
  Administered 2017-03-13: 1 mg via INTRAVENOUS
  Administered 2017-03-13 – 2017-03-14 (×2): 2 mg via INTRAVENOUS
  Filled 2017-03-12 (×9): qty 2

## 2017-03-12 MED ORDER — ACETAMINOPHEN 500 MG PO TABS
1000.0000 mg | ORAL_TABLET | Freq: Four times a day (QID) | ORAL | Status: AC
  Administered 2017-03-12 (×3): 1000 mg via ORAL
  Filled 2017-03-12 (×3): qty 2

## 2017-03-12 MED ORDER — METOCLOPRAMIDE HCL 10 MG PO TABS: 5.0000 mg | ORAL_TABLET | Freq: Three times a day (TID) | ORAL | Status: DC | PRN

## 2017-03-12 MED ORDER — ACETAMINOPHEN 325 MG PO TABS: 650.0000 mg | ORAL_TABLET | ORAL | Status: DC | PRN

## 2017-03-12 MED ORDER — FENTANYL CITRATE (PF) 100 MCG/2ML IJ SOLN
INTRAMUSCULAR | Status: AC
  Administered 2017-03-12: 50 ug via INTRAVENOUS
  Filled 2017-03-12: qty 2

## 2017-03-12 MED ORDER — ROCURONIUM BROMIDE 100 MG/10ML IV SOLN
INTRAVENOUS | Status: DC | PRN
  Administered 2017-03-12: 50 mg via INTRAVENOUS

## 2017-03-12 MED ORDER — NEOMYCIN-POLYMYXIN B GU 40-200000 IR SOLN
Status: DC | PRN
  Administered 2017-03-12: 14 mL

## 2017-03-12 MED ORDER — ROCURONIUM BROMIDE 50 MG/5ML IV SOLN
INTRAVENOUS | Status: AC
  Filled 2017-03-12: qty 2

## 2017-03-12 MED ORDER — LIDOCAINE HCL (CARDIAC) 20 MG/ML IV SOLN
INTRAVENOUS | Status: DC | PRN
  Administered 2017-03-12: 100 mg via INTRAVENOUS

## 2017-03-12 MED ORDER — FENTANYL CITRATE (PF) 100 MCG/2ML IJ SOLN
25.0000 ug | INTRAMUSCULAR | Status: DC | PRN
  Administered 2017-03-12 (×4): 50 ug via INTRAVENOUS

## 2017-03-12 MED ORDER — HYDROMORPHONE HCL 1 MG/ML IJ SOLN
INTRAMUSCULAR | Status: DC | PRN
  Administered 2017-03-12: 2 mg via INTRAVENOUS

## 2017-03-12 MED ORDER — DEXTROSE 5 % IV SOLN
3.0000 g | Freq: Four times a day (QID) | INTRAVENOUS | Status: AC
  Administered 2017-03-12 (×3): 3 g via INTRAVENOUS
  Filled 2017-03-12 (×2): qty 3000
  Filled 2017-03-12: qty 3

## 2017-03-12 MED ORDER — LABETALOL HCL 5 MG/ML IV SOLN
INTRAVENOUS | Status: DC | PRN
  Administered 2017-03-12 (×3): 10 mg via INTRAVENOUS

## 2017-03-12 MED ORDER — ACETAMINOPHEN 10 MG/ML IV SOLN
INTRAVENOUS | Status: AC
  Filled 2017-03-12: qty 100

## 2017-03-12 MED ORDER — HYDROMORPHONE HCL 1 MG/ML IJ SOLN
INTRAMUSCULAR | Status: AC
  Filled 2017-03-12: qty 1

## 2017-03-12 MED ORDER — DEXAMETHASONE SODIUM PHOSPHATE 10 MG/ML IJ SOLN
INTRAMUSCULAR | Status: AC
  Filled 2017-03-12: qty 1

## 2017-03-12 MED ORDER — MIDAZOLAM HCL 2 MG/2ML IJ SOLN
INTRAMUSCULAR | Status: AC
  Filled 2017-03-12: qty 2

## 2017-03-12 MED ORDER — PHENYLEPHRINE HCL 10 MG/ML IJ SOLN
INTRAMUSCULAR | Status: AC
  Filled 2017-03-12: qty 1

## 2017-03-12 MED ORDER — KETOROLAC TROMETHAMINE 15 MG/ML IJ SOLN
INTRAMUSCULAR | Status: AC
  Administered 2017-03-12: 30 mg via INTRAVENOUS
  Filled 2017-03-12: qty 1

## 2017-03-12 MED ORDER — DEXAMETHASONE SODIUM PHOSPHATE 4 MG/ML IJ SOLN
INTRAMUSCULAR | Status: DC | PRN
  Administered 2017-03-12: 10 mg via INTRAVENOUS

## 2017-03-12 MED ORDER — MIDAZOLAM HCL 2 MG/2ML IJ SOLN
INTRAMUSCULAR | Status: DC | PRN
  Administered 2017-03-12: 2 mg via INTRAVENOUS

## 2017-03-12 MED ORDER — ONDANSETRON HCL 4 MG/2ML IJ SOLN
INTRAMUSCULAR | Status: AC
  Filled 2017-03-12: qty 2

## 2017-03-12 MED ORDER — MAGNESIUM HYDROXIDE 400 MG/5ML PO SUSP: 30.0000 mL | Freq: Every day | ORAL | Status: DC | PRN

## 2017-03-12 MED ORDER — ONDANSETRON HCL 4 MG/2ML IJ SOLN: 4.0000 mg | Freq: Four times a day (QID) | INTRAMUSCULAR | Status: DC | PRN

## 2017-03-12 MED ORDER — BUPIVACAINE-EPINEPHRINE (PF) 0.5% -1:200000 IJ SOLN
INTRAMUSCULAR | Status: AC
  Filled 2017-03-12: qty 30

## 2017-03-12 MED ORDER — KETOROLAC TROMETHAMINE 15 MG/ML IJ SOLN
30.0000 mg | Freq: Once | INTRAMUSCULAR | Status: AC
  Administered 2017-03-12: 30 mg via INTRAVENOUS

## 2017-03-12 MED ORDER — ENOXAPARIN SODIUM 40 MG/0.4ML ~~LOC~~ SOLN
40.0000 mg | SUBCUTANEOUS | Status: DC
  Administered 2017-03-13: 40 mg via SUBCUTANEOUS
  Filled 2017-03-12: qty 0.4

## 2017-03-12 MED ORDER — KETAMINE HCL 50 MG/ML IJ SOLN
INTRAMUSCULAR | Status: AC
  Filled 2017-03-12: qty 20

## 2017-03-12 MED ORDER — LORAZEPAM 2 MG/ML IJ SOLN
INTRAMUSCULAR | Status: AC
  Administered 2017-03-12: 1 mg via INTRAVENOUS
  Filled 2017-03-12: qty 1

## 2017-03-12 MED ORDER — HYDROMORPHONE HCL 1 MG/ML IJ SOLN
INTRAMUSCULAR | Status: AC
  Administered 2017-03-12: 0.5 mg via INTRAVENOUS
  Filled 2017-03-12: qty 1

## 2017-03-12 MED ORDER — KETOROLAC TROMETHAMINE 15 MG/ML IJ SOLN
15.0000 mg | Freq: Four times a day (QID) | INTRAMUSCULAR | Status: AC
  Administered 2017-03-12 – 2017-03-13 (×4): 15 mg via INTRAVENOUS
  Filled 2017-03-12 (×4): qty 1

## 2017-03-12 MED ORDER — LISINOPRIL-HYDROCHLOROTHIAZIDE 20-12.5 MG PO TABS: 1.0000 | ORAL_TABLET | Freq: Every day | ORAL | Status: DC

## 2017-03-12 MED ORDER — KCL IN DEXTROSE-NACL 20-5-0.9 MEQ/L-%-% IV SOLN
INTRAVENOUS | Status: DC
  Administered 2017-03-12 – 2017-03-13 (×3): via INTRAVENOUS
  Filled 2017-03-12 (×9): qty 1000

## 2017-03-12 MED ORDER — QUETIAPINE FUMARATE 25 MG PO TABS
50.0000 mg | ORAL_TABLET | Freq: Every day | ORAL | Status: DC
  Administered 2017-03-12 – 2017-03-14 (×3): 50 mg via ORAL
  Filled 2017-03-12 (×3): qty 2

## 2017-03-12 MED ORDER — METOCLOPRAMIDE HCL 5 MG/ML IJ SOLN: 5.0000 mg | Freq: Three times a day (TID) | INTRAMUSCULAR | Status: DC | PRN

## 2017-03-12 MED ORDER — LIDOCAINE HCL (PF) 2 % IJ SOLN
INTRAMUSCULAR | Status: AC
  Filled 2017-03-12: qty 10

## 2017-03-12 MED ORDER — HYDROMORPHONE HCL 1 MG/ML IJ SOLN: 0.2500 mg | INTRAMUSCULAR | Status: DC | PRN

## 2017-03-12 MED ORDER — BISACODYL 10 MG RE SUPP: 10.0000 mg | Freq: Every day | RECTAL | Status: DC | PRN

## 2017-03-12 MED ORDER — SUCCINYLCHOLINE CHLORIDE 20 MG/ML IJ SOLN
INTRAMUSCULAR | Status: DC | PRN
  Administered 2017-03-12: 100 mg via INTRAVENOUS

## 2017-03-12 MED ORDER — TRANEXAMIC ACID 1000 MG/10ML IV SOLN
INTRAVENOUS | Status: AC
  Filled 2017-03-12: qty 10

## 2017-03-12 MED ORDER — ALBUTEROL SULFATE (2.5 MG/3ML) 0.083% IN NEBU: 3.0000 mL | INHALATION_SOLUTION | Freq: Four times a day (QID) | RESPIRATORY_TRACT | Status: DC | PRN

## 2017-03-12 MED ORDER — PROPOFOL 10 MG/ML IV BOLUS
INTRAVENOUS | Status: AC
  Filled 2017-03-12: qty 20

## 2017-03-12 MED ORDER — OXYCODONE HCL 5 MG/5ML PO SOLN
5.0000 mg | Freq: Once | ORAL | Status: DC | PRN
Start: 2017-03-12 — End: 2017-03-12

## 2017-03-12 MED ORDER — LACTATED RINGERS IV SOLN
INTRAVENOUS | Status: DC
  Administered 2017-03-12 (×3): via INTRAVENOUS

## 2017-03-12 MED ORDER — KETAMINE HCL 50 MG/ML IJ SOLN
INTRAMUSCULAR | Status: DC | PRN
  Administered 2017-03-12: 100 mg via INTRAVENOUS

## 2017-03-12 MED ORDER — ACETAMINOPHEN 10 MG/ML IV SOLN
INTRAVENOUS | Status: DC | PRN
  Administered 2017-03-12: 1000 mg via INTRAVENOUS

## 2017-03-12 MED ORDER — BUPIVACAINE LIPOSOME 1.3 % IJ SUSP
INTRAMUSCULAR | Status: AC
  Filled 2017-03-12: qty 20

## 2017-03-12 MED ORDER — OXYCODONE HCL 5 MG PO TABS: 5.0000 mg | ORAL_TABLET | Freq: Once | ORAL | Status: DC | PRN

## 2017-03-12 MED ORDER — OXYCODONE HCL 5 MG PO TABS
30.0000 mg | ORAL_TABLET | ORAL | Status: DC | PRN
  Administered 2017-03-12 – 2017-03-15 (×15): 30 mg via ORAL
  Filled 2017-03-12 (×15): qty 6

## 2017-03-12 MED ORDER — LISINOPRIL 20 MG PO TABS
20.0000 mg | ORAL_TABLET | Freq: Every day | ORAL | Status: DC
  Administered 2017-03-12 – 2017-03-15 (×4): 20 mg via ORAL
  Filled 2017-03-12 (×4): qty 1

## 2017-03-12 MED ORDER — FENTANYL CITRATE (PF) 100 MCG/2ML IJ SOLN
INTRAMUSCULAR | Status: DC | PRN
  Administered 2017-03-12 (×3): 250 ug via INTRAVENOUS

## 2017-03-12 MED ORDER — DOCUSATE SODIUM 100 MG PO CAPS
100.0000 mg | ORAL_CAPSULE | Freq: Two times a day (BID) | ORAL | Status: DC
  Administered 2017-03-12 – 2017-03-15 (×7): 100 mg via ORAL
  Filled 2017-03-12 (×7): qty 1

## 2017-03-12 MED ORDER — BUPIVACAINE-EPINEPHRINE (PF) 0.5% -1:200000 IJ SOLN
INTRAMUSCULAR | Status: DC | PRN
  Administered 2017-03-12: 30 mL via PERINEURAL

## 2017-03-12 MED ORDER — SODIUM CHLORIDE 0.9 % IJ SOLN
INTRAMUSCULAR | Status: AC
  Filled 2017-03-12: qty 50

## 2017-03-12 MED ORDER — HYDROMORPHONE HCL 1 MG/ML IJ SOLN
0.2500 mg | INTRAMUSCULAR | Status: AC | PRN
  Administered 2017-03-12 (×8): 0.5 mg via INTRAVENOUS

## 2017-03-12 MED ORDER — ACETAMINOPHEN 650 MG RE SUPP: 650.0000 mg | RECTAL | Status: DC | PRN

## 2017-03-12 MED ORDER — FLEET ENEMA 7-19 GM/118ML RE ENEM: 1.0000 | ENEMA | Freq: Once | RECTAL | Status: DC | PRN

## 2017-03-12 MED ORDER — GLYCOPYRROLATE 0.2 MG/ML IJ SOLN
INTRAMUSCULAR | Status: AC
  Filled 2017-03-12: qty 1

## 2017-03-12 MED ORDER — SODIUM CHLORIDE 0.9 % IJ SOLN
INTRAMUSCULAR | Status: DC | PRN
  Administered 2017-03-12: 40 mL via INTRAVENOUS

## 2017-03-12 MED ORDER — SODIUM CHLORIDE 0.9 % IV SOLN
INTRAVENOUS | Status: DC | PRN
  Administered 2017-03-12: 60 mL

## 2017-03-12 SURGICAL SUPPLY — 60 items
BAG DECANTER FOR FLEXI CONT (MISCELLANEOUS) IMPLANT
BLADE SAGITTAL WIDE XTHICK NO (BLADE) ×3 IMPLANT
BLADE SURG SZ20 CARB STEEL (BLADE) ×3 IMPLANT
CANISTER SUCT 1200ML W/VALVE (MISCELLANEOUS) ×3 IMPLANT
CANISTER SUCT 3000ML PPV (MISCELLANEOUS) ×6 IMPLANT
CAPT HIP TOTAL 2 ×3 IMPLANT
CATH TRAY METER 16FR LF (MISCELLANEOUS) IMPLANT
CHLORAPREP W/TINT 26ML (MISCELLANEOUS) ×6 IMPLANT
DRAPE IMP U-DRAPE 54X76 (DRAPES) ×3 IMPLANT
DRAPE INCISE IOBAN 66X60 STRL (DRAPES) ×3 IMPLANT
DRAPE SHEET LG 3/4 BI-LAMINATE (DRAPES) ×3 IMPLANT
DRAPE SURG 17X11 SM STRL (DRAPES) ×6 IMPLANT
DRAPE TABLE BACK 80X90 (DRAPES) ×3 IMPLANT
DRSG OPSITE POSTOP 4X10 (GAUZE/BANDAGES/DRESSINGS) ×3 IMPLANT
DRSG OPSITE POSTOP 4X12 (GAUZE/BANDAGES/DRESSINGS) IMPLANT
DRSG OPSITE POSTOP 4X14 (GAUZE/BANDAGES/DRESSINGS) IMPLANT
ELECT BLADE 6.5 EXT (BLADE) ×3 IMPLANT
ELECT CAUTERY BLADE 6.4 (BLADE) ×3 IMPLANT
GAUZE PACK 2X3YD (MISCELLANEOUS) IMPLANT
GLOVE BIO SURGEON STRL SZ7.5 (GLOVE) ×12 IMPLANT
GLOVE BIO SURGEON STRL SZ8 (GLOVE) ×12 IMPLANT
GLOVE BIOGEL PI IND STRL 8 (GLOVE) ×1 IMPLANT
GLOVE BIOGEL PI INDICATOR 8 (GLOVE) ×2
GLOVE INDICATOR 8.0 STRL GRN (GLOVE) ×3 IMPLANT
GOWN STRL REUS W/ TWL LRG LVL3 (GOWN DISPOSABLE) ×1 IMPLANT
GOWN STRL REUS W/ TWL XL LVL3 (GOWN DISPOSABLE) ×1 IMPLANT
GOWN STRL REUS W/TWL LRG LVL3 (GOWN DISPOSABLE) ×2
GOWN STRL REUS W/TWL XL LVL3 (GOWN DISPOSABLE) ×2
HOOD PEEL AWAY FLYTE STAYCOOL (MISCELLANEOUS) ×9 IMPLANT
IV NS 100ML SINGLE PACK (IV SOLUTION) IMPLANT
KIT RM TURNOVER STRD PROC AR (KITS) ×3 IMPLANT
NDL SAFETY 18GX1.5 (NEEDLE) ×3 IMPLANT
NDL SAFETY ECLIPSE 18X1.5 (NEEDLE) ×1 IMPLANT
NEEDLE FILTER BLUNT 18X 1/2SAF (NEEDLE)
NEEDLE FILTER BLUNT 18X1 1/2 (NEEDLE) IMPLANT
NEEDLE HYPO 18GX1.5 SHARP (NEEDLE) ×2
NEEDLE SPNL 20GX3.5 QUINCKE YW (NEEDLE) ×3 IMPLANT
NS IRRIG 1000ML POUR BTL (IV SOLUTION) ×3 IMPLANT
PACK HIP PROSTHESIS (MISCELLANEOUS) ×3 IMPLANT
PILLOW ABDUC SM (MISCELLANEOUS) ×3 IMPLANT
PIN STEINMANN 3/16X9 BAY 6PK (Pin) ×1 IMPLANT
PULSAVAC PLUS IRRIG FAN TIP (DISPOSABLE) ×3
SHELL ACETABULAR 3H 54MM F (Shell) IMPLANT
SOL .9 NS 3000ML IRR  AL (IV SOLUTION) ×2
SOL .9 NS 3000ML IRR UROMATIC (IV SOLUTION) ×1 IMPLANT
SPONGE LAP 18X18 5 PK (GAUZE/BANDAGES/DRESSINGS) IMPLANT
ST PIN 3/16X9 BAY 6PK (Pin) ×3 IMPLANT
STAPLER SKIN PROX 35W (STAPLE) ×3 IMPLANT
SUT TICRON 2-0 30IN 311381 (SUTURE) ×9 IMPLANT
SUT VIC AB 0 CT1 36 (SUTURE) ×3 IMPLANT
SUT VIC AB 1 CT1 36 (SUTURE) ×6 IMPLANT
SUT VIC AB 2-0 CT1 (SUTURE) ×12 IMPLANT
SYR 20CC LL (SYRINGE) ×3 IMPLANT
SYR 30ML LL (SYRINGE) ×9 IMPLANT
SYR TB 1ML 27GX1/2 LL (SYRINGE) IMPLANT
SYRINGE 10CC LL (SYRINGE) ×3 IMPLANT
TAPE TRANSPORE STRL 2 31045 (GAUZE/BANDAGES/DRESSINGS) ×3 IMPLANT
TIP BRUSH PULSAVAC PLUS 24.33 (MISCELLANEOUS) ×3 IMPLANT
TIP FAN IRRIG PULSAVAC PLUS (DISPOSABLE) ×1 IMPLANT
WATER STERILE IRR 1000ML POUR (IV SOLUTION) ×3 IMPLANT

## 2017-03-12 NOTE — Progress Notes (Signed)
Pt in extreme pain
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
## 2017-03-12 NOTE — Anesthesia Procedure Notes (Signed)
Date/Time: 03/12/2017 7:32 AM Performed by: Rosaria Ferries, Joselynn Amoroso Pre-anesthesia Checklist: Patient identified Patient Re-evaluated:Patient Re-evaluated prior to induction Oxygen Delivery Method: Circle system utilized Preoxygenation: Pre-oxygenation with 100% oxygen Induction Type: IV induction Laryngoscope Size: Mac and 4 Grade View: Grade II Tube size: 7.0 mm Number of attempts: 1 Airway Equipment and Method: Stylet Placement Confirmation: ETT inserted through vocal cords under direct vision,  positive ETCO2 and breath sounds checked- equal and bilateral Secured at: 23 cm Tube secured with: Tape Dental Injury: Teeth and Oropharynx as per pre-operative assessment

## 2017-03-12 NOTE — H&P (Signed)
Paper H&P to be scanned into permanent record. H&P reviewed and patient re-examined. No changes. 

## 2017-03-12 NOTE — Progress Notes (Signed)
Patient requested for his abduction pillow to be removed from between his legs. I explained to the patient the risk of not having the pillow in place. Will continue monitor.

## 2017-03-12 NOTE — Progress Notes (Signed)
Ativan given for anxiouness

## 2017-03-12 NOTE — Anesthesia Postprocedure Evaluation (Signed)
Anesthesia Post Note  Patient: Joshua Petersen  Procedure(s) Performed: TOTAL HIP ARTHROPLASTY (Left Hip)  Patient location during evaluation: PACU Anesthesia Type: General Level of consciousness: awake and alert Pain management: pain level controlled Vital Signs Assessment: post-procedure vital signs reviewed and stable Respiratory status: spontaneous breathing, nonlabored ventilation, respiratory function stable and patient connected to nasal cannula oxygen Cardiovascular status: blood pressure returned to baseline and stable Postop Assessment: no apparent nausea or vomiting Anesthetic complications: no     Last Vitals:  Vitals:   03/12/17 1200 03/12/17 1201  BP: 118/73 118/73  Pulse: 95 89  Resp: 15 10  Temp:    SpO2: 97% 96%    Last Pain:  Vitals:   03/12/17 1215  TempSrc:   PainSc: 7                  Cleda Mccreedy Piscitello

## 2017-03-12 NOTE — Care Management (Signed)
POD 0. S/p  Left total hip arthroplasty.  PT consult pending.  RNCM consult for DME equipment as needed.

## 2017-03-12 NOTE — Op Note (Signed)
03/12/2017  10:48 AM  Patient:   Joshua Petersen  Pre-Op Diagnosis:   Degenerative joint disease, left hip.  Post-Op Diagnosis:   Same.  Procedure:   Left total hip arthroplasty.  Surgeon:   Maryagnes AmosJ. Jeffrey Darriel Utter, MD  Assistant:   Horris LatinoLance McGhee, PA-C  Anesthesia:   GET  Findings:   As above.  Complications:   None  EBL:   350 cc  Fluids:   1900 cc crystalloid  UOP:   None  TT:   None  Drains:   None  Closure:   Staples  Implants:   Biomet press-fit system with a #14 laterally offset Echo femoral stem, a 54mm acetabular shell with an E-poly hi-wall liner, and a 36 mm ceramic head with a +6 mm neck.  Brief Clinical Note:   The patient is a 61 year old male with a history of progressively worsening bilateral hip pain, left more symptomatic than right.his symptoms have progressed despite medications, activity modification, etc. His history and examination are consistent with degenerative joint disease, confirmed by plain radiographs. The patient presents at this time for a left total hip arthroplasty.  Procedure:   The patient was brought into the operating room. After adequate spinal anesthesia was obtained, the patient was repositioned in the right lateral decubitus position and secured using a lateral hip positioner. The left hip and lower extremity were prepped with ChloroPrep solution before being draped sterilely. Preoperative antibiotics were administered. A timeout was performed to verify the appropriate surgical site before a standard posterior approach to the hip was made through an approximately 7-8 inch incision. The incision was carried down through the subcutaneous tissues to expose the gluteal fascia and proximal end of the iliotibial band. These structures were split the length of the incision and the Charnley self-retaining hip retractor placed. The bursal tissues were swept posteriorly to expose the short external rotators. The anterior border of the piriformis tendon was  identified and this plane developed down through the capsule to enter the joint. A flap of tissue was elevated off the posterior aspect of the femoral neck and greater trochanter and retracted posteriorly. This flap included the piriformis tendon, the short external rotators, and the posterior capsule. The soft tissues were elevated off the lateral aspect of the ilium and a large Steinmann pin placed bicortically. With the left leg aligned over the right, a drill bit was placed into the greater trochanter parallel to the Steinmann pin and the distance between these two pins measured in order to optimize leg lengths postoperatively. The drill bit was removed and the hip dislocated. The piriformis fossa was debrided of soft tissues before the intramedullary canal was accessed through this point using a triple step reamer. The canal was reamed sequentially beginning with a #7 tapered reamer and progressing to a #14 tapered reamer. This provided excellent circumferential chatter. Using the appropriate guide, a femoral neck cut was made 10-12 mm above the lesser trochanter. The femoral head was removed.  Attention was directed to the acetabular side. The labrum was debrided circumferentially before the ligamentum teres was removed using a large curette. A line was drawn on the drapes corresponding to the native version of the acetabulum. This line was used as a guide while the acetabulum was reamed sequentially beginning with a 47 mm reamer and progressing to a 53 mm reamer. This provided excellent circumferential chatter. The 53 mm trial acetabulum was positioned and found to fit quite well. Therefore, the 54 mm acetabular shell was selected  and impacted into place with care taken to maintain the appropriate version. The trial high wall liner was inserted.  Attention was redirected to the femoral side. A box osteotome was used to establish version before the canal was broached sequentially beginning with a #10  broach and progressing to a #14 broach. This was left in place and several trial reductions performed using both a standard and laterally offset neck options, as well as the +0 mm, the +3 mm, and the +6 mm neck lengths. The "manhole cover" was inserted and tightened securely before the permanent E-polyethylene hi-wall liner was impacted into the acetabular shell and its locking mechanism verified using a quarter-inch osteotome. Next, the #14 lateral offset femoral stem was impacted into place with care taken to maintain the appropriate version. A repeat trial reduction was performed using the +3 mm and the +6 mm neck lengths. The +6 mm neck length demonstrated good stability both in extension and external rotation as well as with flexion to 90 and internal rotation beyond 70. It also was stable in the position of sleep. In order to optimize his stability, the leg was lengthened approximately 5 mm. The 36 mm ceramic head with the +6 mm neck construct was impacted onto the stem of the femoral component. The Morse taper locking mechanism was verified using manual distraction before the head was relocated and placed through a range of motion with the findings as described above.  The wound was copiously irrigated with bacitracin saline solution via the jet lavage system before the peri-incisional and pericapsular tissues were injected with 30 cc of 0.5% Sensorcaine with epinephrine and 20 cc of Exparel diluted out to 60 cc with normal saline to help with postoperative analgesia. The posterior flap was reapproximated to the posterior aspect of the greater trochanter using #2 Tycron interrupted sutures placed through drill holes. Several additional #2 Tycron interrupted sutures were used to reinforce this layer of closure. The iliotibial band was reapproximated using #1 Vicryl interrupted sutures before the gluteal fascia was closed using a running #1 Vicryl suture. At this point, 1 g of transexemic acid in 10 cc of  normal saline was injected into the joint to help reduce postoperative bleeding. The subcutaneous tissues were closed in several layers using 2-0 Vicryl interrupted sutures before the skin was closed using staples. A sterile occlusive dressing was applied to the wound before the patient was placed into an abduction wedge pillow. The patient was then rolled back into the supine position on his/her hospital bed before being awakened and returned to the recovery room in satisfactory condition after tolerating the procedure well.

## 2017-03-12 NOTE — Transfer of Care (Signed)
Immediate Anesthesia Transfer of Care Note  Patient: Joshua Petersen  Procedure(s) Performed: TOTAL HIP ARTHROPLASTY (Left Hip)  Patient Location: PACU  Anesthesia Type:General  Level of Consciousness: awake and patient cooperative  Airway & Oxygen Therapy: Patient Spontanous Breathing and Patient connected to face mask oxygen  Post-op Assessment: Report given to RN and Post -op Vital signs reviewed and stable  Post vital signs: Reviewed and stable  Last Vitals:  Vitals:   03/12/17 0612  BP: (!) 142/81  Pulse: (!) 109  Temp: (!) 36.2 C  SpO2: 95%    Last Pain:  Vitals:   03/12/17 0612  TempSrc: Tympanic  PainSc: 5          Complications: No apparent anesthesia complications

## 2017-03-12 NOTE — Anesthesia Post-op Follow-up Note (Signed)
Anesthesia QCDR form completed.        

## 2017-03-12 NOTE — [place] FL2 (Signed)
[place] MEDICAID FL2 LEVEL OF CARE SCREENING TOOL     IDENTIFICATION  Patient Name: Joshua Petersen Birthdate: [birth year] Sex: male Admission Date (Current Location): 03/12/2017  [place] and [place] Number:  [address]  ([id number] [facility]) Facility and Address:  [facility], [address] [place], [place] [address]      Provider Number: [id number]  Attending Physician Name and Address:  Christena Flake, MD  Relative Name and Phone Number:       Current Level of Care: Hospital Recommended Level of Care: Skilled Nursing Facility Prior Approval Number:    Date Approved/Denied:   PASRR Number:  [id number])  Discharge Plan: SNF    Current Diagnoses: Patient Active Problem List   Diagnosis Date Noted  . Status post total hip replacement, left 03/12/2017    Orientation RESPIRATION BLADDER Height & Weight     Self, Time, Situation, Place  O2 (Nasal Cannula 2L) Continent Weight:   Height:     BEHAVIORAL SYMPTOMS/MOOD NEUROLOGICAL BOWEL NUTRITION STATUS      Continent Diet (Regular)  AMBULATORY STATUS COMMUNICATION OF NEEDS Skin   Extensive Assist Verbally Surgical wounds (Incision left hip)                       Personal Care Assistance Level of Assistance  Bathing, Feeding, Dressing Bathing Assistance: Limited assistance Feeding assistance: Independent Dressing Assistance: Limited assistance     Functional Limitations Info  Sight, Hearing, Speech Sight Info: Adequate Hearing Info: Adequate Speech Info: Adequate    SPECIAL CARE FACTORS FREQUENCY  PT (By licensed PT), OT (By licensed OT)     PT Frequency:  (5) OT Frequency:  (5)            Contractures      Additional Factors Info  Code Status, Allergies Code Status Info:  (Full Code) Allergies Info:  (No Known Allergies)           Current Medications (03/12/2017):  This is the current hospital active medication list Current Facility-Administered Medications  Medication  Dose Route Frequency Provider Last Rate Last Dose  . acetaminophen (TYLENOL) tablet 650 mg  650 mg Oral Q4H PRN Poggi, Excell Seltzer, MD       Or  . acetaminophen (TYLENOL) suppository 650 mg  650 mg Rectal Q4H PRN Poggi, Excell Seltzer, MD      . acetaminophen (TYLENOL) tablet 1,000 mg  1,000 mg Oral Q6H Poggi, Excell Seltzer, MD      . albuterol (PROVENTIL) (2.5 MG/3ML) 0.083% nebulizer solution 3 mL  3 mL Inhalation Q6H PRN Poggi, Excell Seltzer, MD      . bisacodyl (DULCOLAX) suppository 10 mg  10 mg Rectal Daily PRN Poggi, Excell Seltzer, MD      . ceFAZolin (ANCEF) 3 g in dextrose 5 % 50 mL IVPB  3 g Intravenous Q6H Poggi, John J, MD      . dextrose 5 % and 0.9 % NaCl with KCl 20 mEq/L infusion   Intravenous Continuous Poggi, Excell Seltzer, MD      . diphenhydrAMINE (BENADRYL) 12.5 MG/5ML elixir 12.5-25 mg  12.5-25 mg Oral Q4H PRN Poggi, Excell Seltzer, MD      . docusate sodium (COLACE) capsule 100 mg  100 mg Oral BID Poggi, Excell Seltzer, MD      . Melene Muller ON 03/13/2017] enoxaparin (LOVENOX) injection 40 mg  40 mg Subcutaneous Q24H Poggi, Excell Seltzer, MD      . lisinopril (PRINIVIL,ZESTRIL) tablet 20 mg  20 mg Oral Daily Poggi, Excell SeltzerJohn J, MD       And  . hydrochlorothiazide (MICROZIDE) capsule 12.5 mg  12.5 mg Oral Daily Poggi, Excell SeltzerJohn J, MD      . HYDROmorphone (DILAUDID) injection 1-2 mg  1-2 mg Intravenous Q2H PRN Poggi, Excell SeltzerJohn J, MD   2 mg at 03/12/17 1317  . ketorolac (TORADOL) 15 MG/ML injection 15 mg  15 mg Intravenous Q6H Poggi, Excell SeltzerJohn J, MD      . magnesium hydroxide (MILK OF MAGNESIA) suspension 30 mL  30 mL Oral Daily PRN Poggi, Excell SeltzerJohn J, MD      . metoCLOPramide (REGLAN) tablet 5-10 mg  5-10 mg Oral Q8H PRN Poggi, Excell SeltzerJohn J, MD       Or  . metoCLOPramide (REGLAN) injection 5-10 mg  5-10 mg Intravenous Q8H PRN Poggi, Excell SeltzerJohn J, MD      . ondansetron (ZOFRAN) tablet 4 mg  4 mg Oral Q6H PRN Poggi, Excell SeltzerJohn J, MD       Or  . ondansetron (ZOFRAN) injection 4 mg  4 mg Intravenous Q6H PRN Poggi, Excell SeltzerJohn J, MD      . oxyCODONE (Oxy IR/ROXICODONE) immediate release tablet  30 mg  30 mg Oral Q3H PRN Poggi, Excell SeltzerJohn J, MD      . pantoprazole (PROTONIX) EC tablet 40 mg  40 mg Oral QAC breakfast Poggi, Excell SeltzerJohn J, MD      . QUEtiapine (SEROQUEL) tablet 50 mg  50 mg Oral QHS Poggi, Excell SeltzerJohn J, MD      . sodium phosphate (FLEET) 7-19 GM/118ML enema 1 enema  1 enema Rectal Once PRN Poggi, Excell SeltzerJohn J, MD         Discharge Medications: Please see discharge summary for a list of discharge medications.  Relevant Imaging Results:  Relevant Lab Results:   Additional Information  (SSN: [id number])  Payton SparkAnanda A Tacuma Graffam, Student-Social Work

## 2017-03-12 NOTE — Evaluation (Signed)
Physical Therapy Evaluation Patient Details Name: Joshua Petersen MRN: [id number] DOB: 1955-10-19 Today's Date: 03/12/2017   History of Present Illness  Pt is a 61 y.o. male s/p L THA (posterior approach) 03/12/17 secondary to DJD.  Clinical Impression  Prior to hospital admission, pt was modified independent ambulating with axillary crutches the past 3 months.  Pt lives alone in 1 level home with 5-6 STE with B railings.  Currently pt is CGA supine to sit; min assist to stand with RW; and CGA ambulating a few feet with RW bed to recliner.  Pt c/o 10/10 L hip pain (chronic) and 6-7/10 R hip pain but pt appearing to tolerate session fairly well (pt reports being used to high levels of chronic LE pain).  Pt does require vc's for posterior hip precautions with functional mobility.  Pt would benefit from skilled PT to address noted impairments and functional limitations (see below for any additional details).  Upon hospital discharge, recommend pt discharge to home with HHPT.    Follow Up Recommendations Home health PT    Equipment Recommendations  Rolling walker with 5" wheels    Recommendations for Other Services OT consult     Precautions / Restrictions Precautions Precautions: Posterior Hip;Fall Precaution Booklet Issued: Yes (comment) Restrictions Weight Bearing Restrictions: Yes LLE Weight Bearing: Weight bearing as tolerated      Mobility  Bed Mobility Overal bed mobility: Needs Assistance Bed Mobility: Supine to Sit     Supine to sit: Min guard;HOB elevated     General bed mobility comments: mild increased effort to perform; CGA L LE for safety  Transfers Overall transfer level: Needs assistance Equipment used: Rolling walker (2 wheeled) Transfers: Sit to/from Stand Sit to Stand: Min assist         General transfer comment: vc's for L LE positioning for posterior hip precautions with transfers; assist to initiate stand  Ambulation/Gait Ambulation/Gait assistance:  Min guard Ambulation Distance (Feet): 3 Feet (bed to recliner) Assistive device: Rolling walker (2 wheeled)   Gait velocity: decreased   General Gait Details: decreased stance time L LE; vc's for posterior hip precautions with turning  Stairs            Wheelchair Mobility    Modified Rankin (Stroke Patients Only)       Balance Overall balance assessment: Needs assistance Sitting-balance support: No upper extremity supported;Feet supported Sitting balance-Leahy Scale: Good Sitting balance - Comments: sitting reaching within BOS   Standing balance support: Bilateral upper extremity supported Standing balance-Leahy Scale: Poor Standing balance comment: requires UE support for standing balance                             Pertinent Vitals/Pain Pain Assessment: 0-10 Pain Score: 10-Worst pain ever (10/10 L hip; 6-7/10 R hip) Pain Location: L>R hip pain Pain Descriptors / Indicators: Sharp;Shooting Pain Intervention(s): Limited activity within patient's tolerance;Monitored during session;Premedicated before session;Repositioned (NT contacted and reported she would place ice pack)  Vitals (HR and O2 on 2 L via nasal cannula) stable and WFL throughout treatment session.    Home Living Family/patient expects to be discharged to:: Private residence Living Arrangements: Alone   Type of Home: Mobile home Home Access: Stairs to enter Entrance Stairs-Rails: Right;Left;Can reach both Entrance Stairs-Number of Steps: 5-6 Home Layout: One level Home Equipment: Walker - 2 wheels;Crutches      Prior Function Level of Independence: Independent with assistive device(s)  Comments: Pt reports using axillary crutches last 3 months.  Also reports multiple "almost falls" d/t knees/hips "giving out" but is able to catch himself.  Works on houses.     Hand Dominance        Extremity/Trunk Assessment   Upper Extremity Assessment Upper Extremity Assessment:  Overall WFL for tasks assessed    Lower Extremity Assessment Lower Extremity Assessment:  (R LE AROM WFL; L hip flexion at least 3/5; L knee flexion/extension and DF at least 3/5)    Cervical / Trunk Assessment Cervical / Trunk Assessment: Normal  Communication   Communication: No difficulties  Cognition Arousal/Alertness: Awake/alert Behavior During Therapy: WFL for tasks assessed/performed Overall Cognitive Status: Within Functional Limits for tasks assessed                                        General Comments General comments (skin integrity, edema, etc.): Pt resting in bed upon PT entry (nursing just gave pt pain meds).  Nursing cleared pt for participation in physical therapy.  Pt agreeable to PT session.    Exercises Total Joint Exercises Ankle Circles/Pumps: AROM;Strengthening;Both;10 reps;Supine Quad Sets: AROM;Strengthening;Both;10 reps;Supine Short Arc Quad: AROM;Strengthening;Left;10 reps;Supine Heel Slides: AAROM;Strengthening;Left;10 reps;Supine Hip ABduction/ADduction: AAROM;Strengthening;Left;10 reps;Supine   Assessment/Plan    PT Assessment Patient needs continued PT services  PT Problem List Decreased strength;Decreased activity tolerance;Decreased balance;Decreased mobility;Decreased knowledge of use of DME;Decreased knowledge of precautions;Pain       PT Treatment Interventions DME instruction;Gait training;Stair training;Functional mobility training;Therapeutic activities;Therapeutic exercise;Balance training;Patient/family education    PT Goals (Current goals can be found in the Care Plan section)  Acute Rehab PT Goals Patient Stated Goal: to go home PT Goal Formulation: With patient Time For Goal Achievement: 03/26/17 Potential to Achieve Goals: Good    Frequency BID   Barriers to discharge        Co-evaluation               AM-PAC PT "6 Clicks" Daily Activity  Outcome Measure Difficulty turning over in bed  (including adjusting bedclothes, sheets and blankets)?: A Little Difficulty moving from lying on back to sitting on the side of the bed? : A Lot Difficulty sitting down on and standing up from a chair with arms (e.g., wheelchair, bedside commode, etc,.)?: Unable Help needed moving to and from a bed to chair (including a wheelchair)?: A Little Help needed walking in hospital room?: A Little Help needed climbing 3-5 steps with a railing? : A Lot 6 Click Score: 14    End of Session Equipment Utilized During Treatment: Gait belt;Oxygen (2 L O2 via nasal cannula) Activity Tolerance: Patient limited by pain Patient left: in chair;with call bell/phone within reach;with chair alarm set;with SCD's reapplied (pillows placed between pt's knees and B heels elevated via pillows) Nurse Communication: Mobility status;Precautions;Weight bearing status (Pt's pain status) PT Visit Diagnosis: Other abnormalities of gait and mobility (R26.89);Pain;History of falling (Z91.81) Pain - Right/Left: Left Pain - part of body: Hip    Time: 2355-7322 PT Time Calculation (min) (ACUTE ONLY): 45 min   Charges:   PT Evaluation $PT Eval Low Complexity: 1 Low PT Treatments $Therapeutic Exercise: 8-22 mins $Therapeutic Activity: 8-22 mins   PT G Codes:   PT G-Codes **NOT FOR INPATIENT CLASS** Functional Assessment Tool Used: AM-PAC 6 Clicks Basic Mobility Functional Limitation: Mobility: Walking and moving around Mobility: Walking and Moving Around Current Status (G2542):  At least 40 percent but less than 60 percent impaired, limited or restricted Mobility: Walking and Moving Around Goal Status [id number](G8979): 0 percent impaired, limited or restricted    Hendricks Limesmily Ginia Rudell, PT 03/12/17, 4:20 PM [phone]

## 2017-03-13 LAB — CBC WITH DIFFERENTIAL/PLATELET
BASOS PCT: 0 %
Basophils Absolute: 0 10*3/uL (ref 0–0.1)
EOS ABS: 0 10*3/uL (ref 0–0.7)
EOS PCT: 0 %
HCT: 37.1 % — ABNORMAL LOW (ref 40.0–52.0)
HEMOGLOBIN: 12.4 g/dL — AB (ref 13.0–18.0)
Lymphocytes Relative: 19 %
Lymphs Abs: 3 10*3/uL (ref 1.0–3.6)
MCH: 30.1 pg (ref 26.0–34.0)
MCHC: 33.5 g/dL (ref 32.0–36.0)
MCV: 90 fL (ref 80.0–100.0)
Monocytes Absolute: 1.6 10*3/uL — ABNORMAL HIGH (ref 0.2–1.0)
Monocytes Relative: 10 %
NEUTROS PCT: 71 %
Neutro Abs: 11.4 10*3/uL — ABNORMAL HIGH (ref 1.4–6.5)
PLATELETS: 307 10*3/uL (ref 150–440)
RBC: 4.12 MIL/uL — AB (ref 4.40–5.90)
RDW: 13.4 % (ref 11.5–14.5)
WBC: 16.1 10*3/uL — AB (ref 3.8–10.6)

## 2017-03-13 LAB — BASIC METABOLIC PANEL
Anion gap: 4 — ABNORMAL LOW (ref 5–15)
BUN: 15 mg/dL (ref 6–20)
CHLORIDE: 102 mmol/L (ref 101–111)
CO2: 32 mmol/L (ref 22–32)
CREATININE: 0.88 mg/dL (ref 0.61–1.24)
Calcium: 8.6 mg/dL — ABNORMAL LOW (ref 8.9–10.3)
Glucose, Bld: 135 mg/dL — ABNORMAL HIGH (ref 65–99)
POTASSIUM: 3.9 mmol/L (ref 3.5–5.1)
SODIUM: 138 mmol/L (ref 135–145)

## 2017-03-13 MED ORDER — ENOXAPARIN SODIUM 40 MG/0.4ML ~~LOC~~ SOLN
40.0000 mg | Freq: Two times a day (BID) | SUBCUTANEOUS | Status: DC
  Administered 2017-03-13 – 2017-03-15 (×4): 40 mg via SUBCUTANEOUS
  Filled 2017-03-13 (×4): qty 0.4

## 2017-03-13 NOTE — Progress Notes (Signed)
Physical Therapy Treatment Patient Details Name: Joshua Petersen MRN: [id number] DOB: 1955-08-07 Today's Date: 03/13/2017    History of Present Illness Pt is a 61 y.o. male s/p L THA (posterior approach) 03/12/17 secondary to DJD.    PT Comments    Upon arrival to pt's room, pt appearing uncomfortable in bed.  PT offered to come back later for therapy session (d/t pt appearing to be in a lot of pain) but pt requesting to get out of bed and do the session instead.  Pt trying to get OOB on own and requesting therapist assist for L LE; after sitting edge of bed, PT requested pt wait to stand from bed but pt stood up anyway holding onto sink before therapist was able to get gait belt or walker in place; therapist provided CGA for safety and then able to place gait belt and walker after pt was standing.  Attempted to limit mobility but pt requesting to walk because it hurt whether he was walking or sitting around.  After returning to chair, pt appearing very uncomfortable again d/t L hip pain and nursing notified immediately and came to give pt pain meds (improved pain noted after pt received pain meds and therapist also attempted to position pt as comfortable as possible in chair).  Pt continues to require cueing for posterior hip precautions with functional mobility.  Will continue to progress pt with functional mobility and strengthening per pt tolerance.    Follow Up Recommendations  Home health PT     Equipment Recommendations  Rolling walker with 5" wheels    Recommendations for Other Services OT consult     Precautions / Restrictions Precautions Precautions: Posterior Hip;Fall Precaution Booklet Issued: Yes (comment) Restrictions Weight Bearing Restrictions: Yes LLE Weight Bearing: Weight bearing as tolerated    Mobility  Bed Mobility Overal bed mobility: Needs Assistance Bed Mobility: Supine to Sit     Supine to sit: Min assist     General bed mobility comments: assist for L  LE; HOB elevated; vc's for technique  Transfers Overall transfer level: Needs assistance Equipment used: Rolling walker (2 wheeled) Transfers: Sit to/from Stand Sit to Stand: Min guard         General transfer comment: vc's for L LE positioning for posterior hip precautions with transfers; CGA for safety  Ambulation/Gait Ambulation/Gait assistance: Min guard Ambulation Distance (Feet): 40 Feet Assistive device: Rolling walker (2 wheeled)   Gait velocity: decreased   General Gait Details: antalgic; decreased stance time L LE; vc's for L LE positioning with turning (for posterior THP's)   Stairs            Wheelchair Mobility    Modified Rankin (Stroke Patients Only)       Balance Overall balance assessment: Needs assistance Sitting-balance support: No upper extremity supported;Feet supported Sitting balance-Leahy Scale: Good Sitting balance - Comments: sitting reaching within BOS   Standing balance support: Bilateral upper extremity supported Standing balance-Leahy Scale: Poor Standing balance comment: requires UE support for standing balance                            Cognition Arousal/Alertness: Awake/alert Behavior During Therapy: WFL for tasks assessed/performed Overall Cognitive Status: Within Functional Limits for tasks assessed  Exercises      General Comments   Nursing cleared pt for participation in physical therapy.      Pertinent Vitals/Pain Pain Assessment: 0-10 Pain Score: 8  Pain Location: L>R hip pain Pain Descriptors / Indicators: Sharp;Shooting Pain Intervention(s): Limited activity within patient's tolerance;Monitored during session;Premedicated before session;Repositioned;Patient requesting pain meds-RN notified;RN gave pain meds during session;Ice applied Vitals (HR and O2 on room air) stable and WFL throughout treatment session.    Home Living                       Prior Function            PT Goals (current goals can now be found in the care plan section) Acute Rehab PT Goals Patient Stated Goal: to go home PT Goal Formulation: With patient Time For Goal Achievement: 03/26/17 Potential to Achieve Goals: Good Progress towards PT goals: Progressing toward goals    Frequency    BID      PT Plan Current plan remains appropriate    Co-evaluation              AM-PAC PT "6 Clicks" Daily Activity  Outcome Measure  Difficulty turning over in bed (including adjusting bedclothes, sheets and blankets)?: A Little Difficulty moving from lying on back to sitting on the side of the bed? : Unable Difficulty sitting down on and standing up from a chair with arms (e.g., wheelchair, bedside commode, etc,.)?: A Little Help needed moving to and from a bed to chair (including a wheelchair)?: A Little Help needed walking in hospital room?: A Little Help needed climbing 3-5 steps with a railing? : A Lot 6 Click Score: 15    End of Session Equipment Utilized During Treatment: Gait belt Activity Tolerance: Patient limited by pain Patient left: in chair;with call bell/phone within reach;with chair alarm set;with SCD's reapplied (pt refused anything under LE's to offweight heels; pillow placed between pt's knees) Nurse Communication: Mobility status;Precautions;Weight bearing status;Patient requests pain meds (Pt's participation in PT) PT Visit Diagnosis: Other abnormalities of gait and mobility (R26.89);Pain;History of falling (Z91.81) Pain - Right/Left: Left Pain - part of body: Hip     Time: [id number] PT Time Calculation (min) (ACUTE ONLY): 25 min  Charges:  $Gait Training: 8-22 mins $Therapeutic Activity: 8-22 mins                    G CodesHendricks Petersen:       Joshua Petersen, PT 03/13/17, 10:46 AM [phone]

## 2017-03-13 NOTE — Progress Notes (Signed)
Anticoagulation monitoring(Lovenox):  61yo  M ordered Lovenox 40 mg Q24h  Filed Weights   03/12/17 1316  Weight: 299 lb 15.7 oz (136.1 kg)   BMI 45   Lab Results  Component Value Date   CREATININE 0.88 03/13/2017   CREATININE 0.88 03/05/2017   CREATININE 0.78 07/04/2016   Estimated Creatinine Clearance: 117.6 mL/min (by C-G formula based on SCr of 0.88 mg/dL). Hemoglobin & Hematocrit     Component Value Date/Time   HGB 12.4 (L) 03/13/2017 0413   HGB 15.2 06/18/2011 1352   HCT 37.1 (L) 03/13/2017 0413   HCT 45.5 06/18/2011 1352     Per Protocol for Patient with estCrcl > 30 ml/min and BMI > 40, will transition to Lovenox 40 mg Q12h.     Bari Mantis PharmD Clinical Pharmacist 03/13/2017

## 2017-03-13 NOTE — Progress Notes (Signed)
PT Cancellation Note  Patient Details Name: Joshua Petersen MRN: [id number] DOB: [birth year]   Cancelled Treatment:    Reason Eval/Treat Not Completed: Pain limiting ability to participate.  Pt reports being in too much pain (L hip) to participate in any therapy this afternoon (pt with recent pain meds and resting in bed with ice pack applied to L hip); nursing notified.  Per discussion with pt, will re-attempt PT treatment tomorrow morning.  Hendricks Limes, PT 03/13/17, 2:48 PM [phone]
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
## 2017-03-13 NOTE — Progress Notes (Signed)
Clinical Social Worker (CSW) received SNF consult. PT is recommending home health. RN case manager aware of above. Please reconsult if future social work needs arise. CSW signing off.   Donnalee Cellucci, LCSW [phone] 

## 2017-03-13 NOTE — Care Management Note (Signed)
Case Management Note  Patient Details  Name: Joshua Petersen MRN: [id number] Date of Birth: 03-03-1956  Subjective/Objective:  Mew with patient at bedside. He lives alone and reports he has no one to assist him.  Per PT he will have 5-6 steps to get into the home. Offered choice of home health agencies. Referral to [facility] for HHPT. Offered a HHA and patient stated, " it is to far out to know". He has a walker, refused a BSC. Pharmacy: [facility]- [phone]. Called Lovenox 40 mg # 14 no refills.                 Action/Plan: [facility] for HHPT, No DME. Lovenox called in. Will monitor for Lovenox order prior to admission.  Expected Discharge Date:                  Expected Discharge Plan:  Home w Home Health Services  In-House Referral:     Discharge planning Services  CM Consult  Post Acute Care Choice:  Home Health Choice offered to:  Patient  DME Arranged:    DME Agency:     HH Arranged:  PT HH Agency:  [facility] ([facility]  Status of Service:  In process, will continue to follow  If discussed at Long Length of Stay Meetings, dates discussed:    Additional Comments:  Marily Memos, RN 03/13/2017, 10:53 AM

## 2017-03-14 LAB — BASIC METABOLIC PANEL
Anion gap: 6 (ref 5–15)
BUN: 13 mg/dL (ref 6–20)
CHLORIDE: 100 mmol/L — AB (ref 101–111)
CO2: 30 mmol/L (ref 22–32)
CREATININE: 0.72 mg/dL (ref 0.61–1.24)
Calcium: 8.4 mg/dL — ABNORMAL LOW (ref 8.9–10.3)
GFR calc non Af Amer: >60 mL/min (ref >60)
Glucose, Bld: 110 mg/dL — ABNORMAL HIGH (ref 65–99)
POTASSIUM: 3.9 mmol/L (ref 3.5–5.1)
SODIUM: 136 mmol/L (ref 135–145)

## 2017-03-14 LAB — CBC
HEMATOCRIT: 35.5 % — AB (ref 40.0–52.0)
Hemoglobin: 11.8 g/dL — ABNORMAL LOW (ref 13.0–18.0)
MCH: 29.9 pg (ref 26.0–34.0)
MCHC: 33.2 g/dL (ref 32.0–36.0)
MCV: 90.2 fL (ref 80.0–100.0)
PLATELETS: 304 10*3/uL (ref 150–440)
RBC: 3.94 MIL/uL — AB (ref 4.40–5.90)
RDW: 13.5 % (ref 11.5–14.5)
WBC: 15.9 10*3/uL — AB (ref 3.8–10.6)

## 2017-03-14 LAB — SURGICAL PATHOLOGY

## 2017-03-14 MED ORDER — CYCLOBENZAPRINE HCL 10 MG PO TABS
10.0000 mg | ORAL_TABLET | Freq: Three times a day (TID) | ORAL | Status: DC | PRN
  Administered 2017-03-14 – 2017-03-15 (×3): 10 mg via ORAL
  Filled 2017-03-14 (×3): qty 1

## 2017-03-14 MED ORDER — TRAMADOL HCL 50 MG PO TABS
50.0000 mg | ORAL_TABLET | Freq: Four times a day (QID) | ORAL | Status: DC | PRN
  Administered 2017-03-14: 100 mg via ORAL
  Administered 2017-03-14: 50 mg via ORAL
  Administered 2017-03-15: 100 mg via ORAL
  Filled 2017-03-14 (×3): qty 2

## 2017-03-14 MED ORDER — KETOROLAC TROMETHAMINE 10 MG PO TABS: 10.0000 mg | ORAL_TABLET | Freq: Four times a day (QID) | ORAL | 0 refills | Status: DC | PRN

## 2017-03-14 MED ORDER — KETOROLAC TROMETHAMINE 30 MG/ML IJ SOLN
60.0000 mg | Freq: Once | INTRAMUSCULAR | Status: AC
  Administered 2017-03-14: 60 mg via INTRAMUSCULAR
  Filled 2017-03-14: qty 2

## 2017-03-14 MED ORDER — TRAMADOL HCL 50 MG PO TABS: 50.0000 mg | ORAL_TABLET | Freq: Four times a day (QID) | ORAL | 0 refills | Status: DC | PRN
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
## 2017-03-14 NOTE — Progress Notes (Signed)
Physical Therapy Treatment Patient Details Name: Joshua Petersen MRN: [id number] DOB: 11-16-1955 Today's Date: 03/14/2017    History of Present Illness Pt is a 61 y.o. male s/p L THA (posterior approach) 03/12/17 secondary to DJD.    PT Comments    Pt c/o 8/10 L hip pain beginning and end of session.  Pt unable to stand on own from recliner chair and required mod assist to stand up to RW (limited d/t L hip pain).  Attempted to limit ambulation distance d/t pt's c/o significant L hip pain but pt declined to turn around (to return to chair) when educated to multiple times; pt ambulated 80 feet CGA and required 2 standing resting breaks d/t significant L hip pain and B UE fatigue; antalgic gait noted with significant increased UE support through RW.   D/t assist required to stand and limitations with functional mobility, recommend pt discharge to STR (SW notified).   Follow Up Recommendations  SNF     Equipment Recommendations  Rolling walker with 5" wheels;3in1 (PT)    Recommendations for Other Services OT consult     Precautions / Restrictions Precautions Precautions: Posterior Hip;Fall Precaution Booklet Issued: Yes (comment) Restrictions Weight Bearing Restrictions: Yes LLE Weight Bearing: Weight bearing as tolerated    Mobility  Bed Mobility               General bed mobility comments: Deferred d/t pt in recliner beginning and end of session.  Transfers Overall transfer level: Needs assistance Equipment used: Rolling walker (2 wheeled) Transfers: Sit to/from Stand Sit to Stand: Mod assist         General transfer comment: pt unable to stand on own from recliner; required mod assist to stand from recliner d/t c/o significant L hip pain; vc's for L LE positioning for posterior hip precautions with transfers  Ambulation/Gait Ambulation/Gait assistance: Min guard Ambulation Distance (Feet): 80 Feet Assistive device: Rolling walker (2 wheeled)   Gait velocity:  decreased   General Gait Details: antalgic; decreased stance time L LE; vc's for L LE positioning with turning (for posterior THP's); significant UE support through RW noted; 2 standing rest breaks required d/t L hip pain and UE fatigue   Stairs            Wheelchair Mobility    Modified Rankin (Stroke Patients Only)       Balance Overall balance assessment: Needs assistance Sitting-balance support: No upper extremity supported;Feet supported Sitting balance-Leahy Scale: Good Sitting balance - Comments: sitting reaching within BOS   Standing balance support: Bilateral upper extremity supported Standing balance-Leahy Scale: Poor Standing balance comment: requires UE support for standing balance                            Cognition Arousal/Alertness: Awake/alert Behavior During Therapy: WFL for tasks assessed/performed Overall Cognitive Status: Within Functional Limits for tasks assessed                                        Exercises General Exercises - Lower Extremity Ankle Circles/Pumps: AROM;Strengthening;Left;10 reps;Seated Long Arc Quad: AROM;Strengthening;Left;10 reps;Seated    General Comments General comments (skin integrity, edema, etc.): Pt sitting in recliner upon PT entry.  Nursing reports recent pain meds and pt not due for any more pain meds.  Nursing cleared pt for participation in physical therapy.  Pt agreeable to  PT session.      Pertinent Vitals/Pain Pain Assessment: 0-10 Pain Score: 8  Pain Location: L hip pain Pain Descriptors / Indicators: Sharp;Shooting Pain Intervention(s): Limited activity within patient's tolerance;Monitored during session;Premedicated before session;Repositioned;Patient requesting pain meds-RN notified;Ice applied  Vitals (HR and O2 on room air) stable and WFL throughout treatment session.    Home Living                      Prior Function            PT Goals (current goals can  now be found in the care plan section) Acute Rehab PT Goals Patient Stated Goal: to go home PT Goal Formulation: With patient Time For Goal Achievement: 03/26/17 Potential to Achieve Goals: Fair Progress towards PT goals: Not progressing toward goals - comment (increased assist with transfers today)    Frequency    BID      PT Plan Discharge plan needs to be updated (SW notified)    Co-evaluation              AM-PAC PT "6 Clicks" Daily Activity  Outcome Measure  Difficulty turning over in bed (including adjusting bedclothes, sheets and blankets)?: A Lot Difficulty moving from lying on back to sitting on the side of the bed? : Unable Difficulty sitting down on and standing up from a chair with arms (e.g., wheelchair, bedside commode, etc,.)?: Unable Help needed moving to and from a bed to chair (including a wheelchair)?: A Lot Help needed walking in hospital room?: A Little Help needed climbing 3-5 steps with a railing? : A Lot 6 Click Score: 11    End of Session Equipment Utilized During Treatment: Gait belt Activity Tolerance: Patient limited by pain Patient left: in chair;with call bell/phone within reach;with chair alarm set;with SCD's reapplied (pillow placed between pt's knees (pt declined heels elevated)) Nurse Communication: Mobility status;Precautions;Weight bearing status;Patient requests pain meds PT Visit Diagnosis: Other abnormalities of gait and mobility (R26.89);Pain;History of falling (Z91.81) Pain - Right/Left: Left Pain - part of body: Hip     Time: [id number] PT Time Calculation (min) (ACUTE ONLY): 25 min  Charges:  $Gait Training: 8-22 mins $Therapeutic Activity: 8-22 mins                    G CodesHendricks Limes:       Exander Shaul, PT 03/14/17, 10:13 AM [phone]

## 2017-03-14 NOTE — Clinical Social Work Note (Signed)
Clinical Social Work Assessment  Patient Details  Name: Joshua Petersen MRN: [id number] Date of Birth: 08/31/1955  Date of referral:  03/14/17               Reason for consult:  Facility Placement                Permission sought to share information with:  Chartered certified accountant granted to share information::  Yes, Verbal Permission Granted  Name::      Joshua Petersen referral   Agency::     Relationship::     Contact Information:     Housing/Transportation Living arrangements for the past 2 months:  Single Family Home Source of Information:  Patient Patient Interpreter Needed:  None Criminal Activity/Legal Involvement Pertinent to Current Situation/Hospitalization:  No - Comment as needed Significant Relationships:  Adult Children Lives with:  Self Do you feel safe going back to the place where you live?  Yes Need for family participation in patient care:  No (Coment)  Care giving concerns:  Patient lives alone in [place], [place].     Social Worker assessment / plan:  Holiday representative (CSW) received verbal consult from PT that recommendation has changed from home health to SNF. CSW met with patient alone at bedside to discuss D/C plan. Patient was alert and oriented X4 and was sitting up in the chair at bedside. CSW introduced self and explained role of CSW department. Patient reported that he lives alone in [place] and receives SSI $750 per month. Patient reported that his son Joshua Petersen is his primary support. CSW explained that PT is now recommending SNF. CSW explained that under medicaid at a SNF patient would 1) have to stay at the SNF for 30 days 2) sign over his SSI check to the SNF and 3) be willing to go outside of [facility]. CSW made patient aware that the [facility] in [place] is the only bed offer. Patient reported that he is not willing to sign over his SSI check because he would lose his home. Patient reported that he pays rent with his SSI  money. Per patient he has a son, neighbors and friends that can check on him. Patient reported that he is going home with home health and is not willing to go to SNF. CSW explained the benefits of SNF. Patient declined SNF because he is not able or willing to sign over his SSI check to the SNF. RN case manager aware of above. PT aware of above. CSW will continue to follow and assist as needed.   Employment status:  Disabled (Comment on whether or not currently receiving Disability) Insurance information:  Medicaid In [place] PT Recommendations:  [place] / Referral to community resources:  [place]  Patient/Family's Response to care:  Patient refused SNF because he reported that he is not able to sign over his SSI check.   Patient/Family's Understanding of and Emotional Response to Diagnosis, Current Treatment, and Prognosis:  Patient was pleasant and thanked CSW for assistance.   Emotional Assessment Appearance:  Appears stated age Attitude/Demeanor/Rapport:    Affect (typically observed):  Accepting, Pleasant Orientation:  Oriented to Self, Oriented to Place, Oriented to  Time, Oriented to Situation Alcohol / Substance use:  Not Applicable Psych involvement (Current and /or in the community):  No (Comment)  Discharge Needs  Concerns to be addressed:  Discharge Planning Concerns Readmission within the last 30 days:  No Current discharge risk:  Dependent with Mobility Barriers to Discharge:  Continued Medical Work up   [employer], Veronia Beets, LCSW 03/14/2017, 9:38 AM

## 2017-03-14 NOTE — Progress Notes (Signed)
Physical Therapy Treatment Patient Details Name: Joshua Petersen MRN: [id number] DOB: May 19, 1955 Today's Date: 03/14/2017    History of Present Illness Pt is a 61 y.o. male s/p L THA (posterior approach) 03/12/17 secondary to DJD.    PT Comments    Pt limited to ambulating 60 feet with RW d/t c/o 10/10 L hip pain with functional mobility; significant UE support noted through RW to off-weight L LE.  Pt requesting another "shot" to L hip to alleviate pain (pt reports discussing this with doctor and asking when he was going to get it).  Charge nurse notified of this.  Will continue to progress pt with strengthening and functional mobility per pt tolerance.    Follow Up Recommendations  SNF     Equipment Recommendations  Rolling walker with 5" wheels;3in1 (PT)    Recommendations for Other Services OT consult     Precautions / Restrictions Precautions Precautions: Posterior Hip;Fall Precaution Booklet Issued: Yes (comment) Restrictions Weight Bearing Restrictions: Yes LLE Weight Bearing: Weight bearing as tolerated    Mobility  Bed Mobility Overal bed mobility: Needs Assistance Bed Mobility: Supine to Sit     Supine to sit: Supervision;HOB elevated     General bed mobility comments: increased effort and time to perform but pt able to do on own and use B UE's to move L LE  Transfers Overall transfer level: Needs assistance Equipment used: Rolling walker (2 wheeled) Transfers: Sit to/from Stand Sit to Stand: Min assist         General transfer comment: increased effort to stand from bed (min assist to initiate stand); intermittent vc's for posterior hip precautions with transfers  Ambulation/Gait Ambulation/Gait assistance: Min guard Ambulation Distance (Feet): 60 Feet Assistive device: Rolling walker (2 wheeled)   Gait velocity: decreased   General Gait Details: antalgic; decreased stance time L LE; vc's for L LE positioning with turning (for posterior THP's);  significant UE support through RW noted; limited distance d/t L hip pain   Stairs            Wheelchair Mobility    Modified Rankin (Stroke Patients Only)       Balance Overall balance assessment: Needs assistance Sitting-balance support: No upper extremity supported;Feet supported Sitting balance-Leahy Scale: Good Sitting balance - Comments: sitting reaching within BOS   Standing balance support: Bilateral upper extremity supported Standing balance-Leahy Scale: Poor Standing balance comment: requires UE support for standing balance                            Cognition Arousal/Alertness: Awake/alert Behavior During Therapy: WFL for tasks assessed/performed Overall Cognitive Status: Within Functional Limits for tasks assessed                                        Exercises      General Comments General comments (skin integrity, edema, etc.): Pt resting in bed upon PT arrival.  Nursing reports recent pain meds.      Pertinent Vitals/Pain Pain Assessment: 0-10 Pain Score: 8  Pain Location: L hip pain Pain Descriptors / Indicators: Sharp;Shooting Pain Intervention(s): Limited activity within patient's tolerance;Monitored during session;Premedicated before session;Repositioned;Ice applied    Home Living                      Prior Function  PT Goals (current goals can now be found in the care plan section) Acute Rehab PT Goals Patient Stated Goal: to go home PT Goal Formulation: With patient Time For Goal Achievement: 03/26/17 Potential to Achieve Goals: Fair Progress towards PT goals: Progressing toward goals    Frequency    BID      PT Plan Current plan remains appropriate    Co-evaluation              AM-PAC PT "6 Clicks" Daily Activity  Outcome Measure  Difficulty turning over in bed (including adjusting bedclothes, sheets and blankets)?: A Lot Difficulty moving from lying on back to  sitting on the side of the bed? : A Lot Difficulty sitting down on and standing up from a chair with arms (e.g., wheelchair, bedside commode, etc,.)?: Unable Help needed moving to and from a bed to chair (including a wheelchair)?: A Little Help needed walking in hospital room?: A Little Help needed climbing 3-5 steps with a railing? : A Lot 6 Click Score: 13    End of Session Equipment Utilized During Treatment: Gait belt Activity Tolerance: Patient limited by pain Patient left: in chair;with call bell/phone within reach;with chair alarm set;with SCD's reapplied (pillow placed between pt's knees) Nurse Communication: Mobility status;Precautions;Weight bearing status;Patient requests pain meds PT Visit Diagnosis: Other abnormalities of gait and mobility (R26.89);Pain;History of falling (Z91.81) Pain - Right/Left: Left Pain - part of body: Hip     Time: 1438-8875 PT Time Calculation (min) (ACUTE ONLY): 15 min  Charges:  $Therapeutic Activity: 8-22 mins                    G CodesHendricks Petersen, PT 03/14/17, 5:43 PM [phone]

## 2017-03-15 LAB — BASIC METABOLIC PANEL
ANION GAP: 7 (ref 5–15)
BUN: 15 mg/dL (ref 6–20)
CALCIUM: 8.9 mg/dL (ref 8.9–10.3)
CO2: 31 mmol/L (ref 22–32)
Chloride: 99 mmol/L — ABNORMAL LOW (ref 101–111)
Creatinine, Ser: 0.81 mg/dL (ref 0.61–1.24)
GFR calc Af Amer: >60 mL/min (ref >60)
Glucose, Bld: 133 mg/dL — ABNORMAL HIGH (ref 65–99)
POTASSIUM: 3.9 mmol/L (ref 3.5–5.1)
SODIUM: 137 mmol/L (ref 135–145)

## 2017-03-15 LAB — CBC
HCT: 36.5 % — ABNORMAL LOW (ref 40.0–52.0)
HEMOGLOBIN: 12.1 g/dL — AB (ref 13.0–18.0)
MCH: 30.2 pg (ref 26.0–34.0)
MCHC: 33.1 g/dL (ref 32.0–36.0)
MCV: 91.1 fL (ref 80.0–100.0)
PLATELETS: 326 10*3/uL (ref 150–440)
RBC: 4.01 MIL/uL — AB (ref 4.40–5.90)
RDW: 13.3 % (ref 11.5–14.5)
WBC: 15 10*3/uL — ABNORMAL HIGH (ref 3.8–10.6)

## 2017-03-15 MED ORDER — KETOROLAC TROMETHAMINE 60 MG/2ML IM SOLN
60.0000 mg | Freq: Once | INTRAMUSCULAR | Status: AC
  Administered 2017-03-15: 60 mg via INTRAMUSCULAR
  Filled 2017-03-15: qty 2

## 2017-03-15 NOTE — Discharge Instructions (Signed)
Diet: As you were doing prior to hospitalization   Shower:  May shower but keep the wounds dry, use an occlusive plastic wrap, NO SOAKING IN TUB.  If the bandage gets wet, change with a clean dry gauze.  Dressing:  You may change your dressing as needed. Change the dressing with sterile gauze dressing.    Activity:  Increase activity slowly as tolerated, but follow the weight bearing instructions below.  No lifting or driving for 6 weeks.  Weight Bearing:   Weight bearing as tolerated to left lower extremity  To prevent constipation: you may use a stool softener such as -  Colace (over the counter) 100 mg by mouth twice a day  Drink plenty of fluids (prune juice may be helpful) and high fiber foods Miralax (over the counter) for constipation as needed.    Itching:  If you experience itching with your medications, try taking only a single pain pill, or even half a pain pill at a time.  You may take up to 10 pain pills per day, and you can also use benadryl over the counter for itching or also to help with sleep.   Precautions:  If you experience chest pain or shortness of breath - call 911 immediately for transfer to the hospital emergency department!!  If you develop a fever greater that 101 F, purulent drainage from wound, increased redness or drainage from wound, or calf pain-Call [facility]                                              Follow- Up Appointment:  Please call for an appointment to be seen in 2 weeks at [facility]  BEFORE FILLING ANY MEDICATIONS PRESCRIBED BY LANCE Ely Spragg PA-C OR DR. POGGI, CONTACT PAIN CLINIC ABOUT CURRENT PAIN CONTRACT, IF YOU DON'T THEY COULD DISCHARGE YOU FROM THE PAIN CLINIC.  TAKE LOVENOX AS PRESCRIBED.

## 2017-03-15 NOTE — Progress Notes (Signed)
Patient is being discharged to home with [facility].  Belongings packed, DC & RX instructions given and patient acknowledged understanding. Patient stated that he had no questions.  IV removed and his ride was called.  NT helped patient get ready to go.
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
## 2017-03-15 NOTE — Care Management Note (Signed)
Case Management Note  Patient Details  Name: KURTIS CORD MRN: [id number] Date of Birth: Mar 19, 1956  Subjective/Objective: discharging today                   Action/Plan: TC to Janell Quiet to clarify Lovenox order. He did want RNCM to call in Lovenox 40 mg # 14 no refills. [facility] notified of discharge.   Expected Discharge Date:  03/15/17               Expected Discharge Plan:  Home w Home Health Services  In-House Referral:     Discharge planning Services  CM Consult  Post Acute Care Choice:  Home Health Choice offered to:  Patient  DME Arranged:    DME Agency:     HH Arranged:  PT HH Agency:  [facility] ([facility]  Status of Service:  Completed, signed off  If discussed at [employer], dates discussed:    Additional Comments:  Marily Memos, RN 03/15/2017, 8:49 AM

## 2017-03-15 NOTE — Progress Notes (Signed)
Physical Therapy Treatment Patient Details Name: Joshua Petersen MRN: [id number] DOB: 17-Feb-1956 Today's Date: 03/15/2017    History of Present Illness Pt is a 61 y.o. male s/p L THA (posterior approach) 03/12/17 secondary to DJD.    PT Comments    Pt able to progress ambulation distance to 100 feet (pt utilized RW for 50 feet and axillary crutches for 50 feet d/t pt requesting to use axillary crutches to see how it felt); pt appearing steady with RW and axillary crutches.  8/10 L hip pain during session.  Also able to navigate 4 stairs safely with B axillary crutches CGA (pt brought back to room sitting in recliner chair after performing stairs).  Therapist limited mobility/activity in morning session to prevent increase in L hip pain during the day (d/t pt having pain issues during hospital stay).  Pt requiring education on pain management, pacing, activity modification, WB'ing status, and posterior hip precautions.  Pt verbalizing his own ideas on how things should be done during session and in regards to above education but pt also verbalizing that he understands above education.  Plan for pt to discharge home today.   Follow Up Recommendations  Home health PT     Equipment Recommendations  Rolling walker with 5" wheels;3in1 (PT)    Recommendations for Other Services       Precautions / Restrictions Precautions Precautions: Posterior Hip;Fall Precaution Booklet Issued: Yes (comment) Restrictions Weight Bearing Restrictions: Yes LLE Weight Bearing: Weight bearing as tolerated    Mobility  Bed Mobility Overal bed mobility: Needs Assistance Bed Mobility: Supine to Sit     Supine to sit: Supervision;HOB elevated     General bed mobility comments: increased effort and time to perform but pt able to do on own and use B UE's to move L LE  Transfers Overall transfer level: Needs assistance Equipment used: Rolling walker (2 wheeled) Transfers: Sit to/from Stand Sit to Stand:  Supervision         General transfer comment: increased effort to perform but able to perform on own from various surfaces  Ambulation/Gait Ambulation/Gait assistance: Min guard;Supervision Ambulation Distance (Feet): 100 Feet Assistive device: Rolling walker (2 wheeled);Crutches Gait Pattern/deviations: Step-to pattern Gait velocity: decreased   General Gait Details: antalgic; decreased stance time L LE; significant UE support through AD's noted; limited distance d/t L hip pain; pt ambulated 50 feet with B axillary crutches and 50 feet with RW   Stairs Stairs: Yes   Stair Management: Step to pattern;Forwards;With crutches Number of Stairs: 4 General stair comments: ascended/descended 4 stairs with B axillary crutches (pt steady with safe technique; no vc's required--pt reports this is how he has been doing stairs d/t pain)  Wheelchair Mobility    Modified Rankin (Stroke Patients Only)       Balance Overall balance assessment: Needs assistance Sitting-balance support: No upper extremity supported;Feet supported Sitting balance-Leahy Scale: Good Sitting balance - Comments: sitting reaching within BOS   Standing balance support: Single extremity supported Standing balance-Leahy Scale: Poor Standing balance comment: requires at least single UE support for standing balance d/t L hip pain                            Cognition Arousal/Alertness: Awake/alert Behavior During Therapy: WFL for tasks assessed/performed Overall Cognitive Status: Within Functional Limits for tasks assessed  Exercises      General Comments General comments (skin integrity, edema, etc.): Pt resting in bed upon PT arrival.  Nursing cleared pt for participation in physical therapy and gave pt pain meds before and at very beginning of PT session.  Pt agreeable to PT session despite L hip pain.      Pertinent Vitals/Pain Pain  Assessment: 0-10 Pain Score: 8  Pain Location: L hip pain Pain Descriptors / Indicators: Sharp;Shooting Pain Intervention(s): Limited activity within patient's tolerance;Monitored during session;Premedicated before session;Repositioned;RN gave pain meds during session (Pt declined ice pack end of session)     Home Living                      Prior Function            PT Goals (current goals can now be found in the care plan section) Acute Rehab PT Goals Patient Stated Goal: to go home PT Goal Formulation: With patient Time For Goal Achievement: 03/26/17 Potential to Achieve Goals: Fair Progress towards PT goals: Progressing toward goals    Frequency    BID      PT Plan Discharge plan needs to be updated    Co-evaluation              AM-PAC PT "6 Clicks" Daily Activity  Outcome Measure  Difficulty turning over in bed (including adjusting bedclothes, sheets and blankets)?: A Lot Difficulty moving from lying on back to sitting on the side of the bed? : A Lot Difficulty sitting down on and standing up from a chair with arms (e.g., wheelchair, bedside commode, etc,.)?: A Little Help needed moving to and from a bed to chair (including a wheelchair)?: A Little Help needed walking in hospital room?: A Little Help needed climbing 3-5 steps with a railing? : A Little 6 Click Score: 16    End of Session Equipment Utilized During Treatment: Gait belt Activity Tolerance: Patient limited by pain Patient left: in chair;with call bell/phone within reach;with chair alarm set (Pt declined to have LE's elevated and declined SCD's ) Nurse Communication: Mobility status;Precautions;Weight bearing status PT Visit Diagnosis: Other abnormalities of gait and mobility (R26.89);Pain;History of falling (Z91.81) Pain - Right/Left: Left Pain - part of body: Hip     Time: 1000-1023 PT Time Calculation (min) (ACUTE ONLY): 23 min  Charges:  $Gait Training: 8-22  mins $Therapeutic Activity: 8-22 mins                    G CodesHendricks Limes, PT 03/15/17, 11:07 AM [phone]

## 2017-03-28 ENCOUNTER — Emergency Department
Admission: EM | Admit: 2017-03-28 | Discharge: 2017-03-28 | Discharge status: Against Medical Advice | Payer: Medicaid Other | Attending: Emergency Medicine | Admitting: Emergency Medicine

## 2017-03-28 ENCOUNTER — Other Ambulatory Visit: Payer: Self-pay

## 2017-03-28 DIAGNOSIS — I10 Essential (primary) hypertension: Secondary | ICD-10-CM | POA: Insufficient documentation

## 2017-03-28 DIAGNOSIS — Z96642 Presence of left artificial hip joint: Secondary | ICD-10-CM | POA: Diagnosis not present

## 2017-03-28 DIAGNOSIS — M25552 Pain in left hip: Secondary | ICD-10-CM | POA: Diagnosis not present

## 2017-03-28 DIAGNOSIS — F17220 Nicotine dependence, chewing tobacco, uncomplicated: Secondary | ICD-10-CM | POA: Diagnosis not present

## 2017-03-28 DIAGNOSIS — Z760 Encounter for issue of repeat prescription: Secondary | ICD-10-CM
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
## 2017-03-28 NOTE — ED Notes (Signed)
Patient was assisted to the lobby via wheelchair. Volunteer helped the patient to the parking lot to get his car.

## 2017-03-28 NOTE — ED Notes (Addendum)
Spoke with Servando Salina at [facility] pharmacy in [place] who states the pt has the prescriptions for the pain meds but they are waiting for the preauthorization which  Idolina Primer, [place] at the pain clinic in [place] [phone] is already working on. The pt has been made aware of this but wants something for pain now.Marland Kitchen

## 2017-03-28 NOTE — ED Notes (Signed)
Patient refused to sign for AMA.

## 2017-03-28 NOTE — Discharge Instructions (Signed)
Due to the recent prescription 2 days ago and also that you are under pain management ,narcotic medication will not be prescribed. Follow-up with pending prescription at the pharmacy tomorrow.

## 2017-03-28 NOTE — ED Triage Notes (Signed)
Pt states he had left hip replacement 2 weeks ago by Dr. Joice Lofts and was seen at the pain clinic today and states they changed the dose and when he went to pick them up the said that the insurance had not approved them.

## 2017-10-11 ENCOUNTER — Encounter: Payer: Self-pay | Admitting: Emergency Medicine

## 2017-10-11 ENCOUNTER — Other Ambulatory Visit: Payer: Self-pay

## 2017-10-11 ENCOUNTER — Emergency Department: Payer: Medicaid Other

## 2017-10-11 ENCOUNTER — Emergency Department
Admission: EM | Admit: 2017-10-11 | Discharge: 2017-10-11 | Disposition: A | Discharge status: Home / Self Care | Payer: Medicaid Other | Attending: Emergency Medicine | Admitting: Emergency Medicine

## 2017-10-11 DIAGNOSIS — Z5321 Procedure and treatment not carried out due to patient leaving prior to being seen by health care provider: Secondary | ICD-10-CM | POA: Insufficient documentation

## 2017-10-11 DIAGNOSIS — R079 Chest pain, unspecified: Secondary | ICD-10-CM | POA: Insufficient documentation

## 2017-10-11 LAB — BASIC METABOLIC PANEL
Anion gap: 13 (ref 5–15)
BUN: 16 mg/dL (ref 6–20)
CALCIUM: 9.5 mg/dL (ref 8.9–10.3)
CO2: 24 mmol/L (ref 22–32)
CREATININE: 1.01 mg/dL (ref 0.61–1.24)
Chloride: 99 mmol/L — ABNORMAL LOW (ref 101–111)
GFR calc Af Amer: >60 mL/min (ref >60)
GLUCOSE: 130 mg/dL — AB (ref 65–99)
POTASSIUM: 3.7 mmol/L (ref 3.5–5.1)
SODIUM: 136 mmol/L (ref 135–145)

## 2017-10-11 LAB — TROPONIN I: Troponin I: <0.03 ng/mL (ref <0.03)

## 2017-10-11 LAB — CBC
HEMATOCRIT: 43.7 % (ref 40.0–52.0)
Hemoglobin: 14.9 g/dL (ref 13.0–18.0)
MCH: 29 pg (ref 26.0–34.0)
MCHC: 34.2 g/dL (ref 32.0–36.0)
MCV: 84.8 fL (ref 80.0–100.0)
PLATELETS: 391 10*3/uL (ref 150–440)
RBC: 5.15 MIL/uL (ref 4.40–5.90)
RDW: 14.1 % (ref 11.5–14.5)
WBC: 9.9 10*3/uL (ref 3.8–10.6)

## 2017-10-11 NOTE — ED Triage Notes (Signed)
Arrives c/o chest pain "for a long time".  Patient is AAOx3.  Skin warm and dry. NAD

## 2017-10-11 NOTE — ED Triage Notes (Signed)
Pt c/o left sided chest pain. Tachypnea but unlabored currently.  ST.  Reports gets this chest pain when runs out of narcotics because they cut them back r/t opoid crisis.  This happened for last 8-9 months but worse last month or 2.  Pt informed let RN know if gets worse.

## 2017-10-19 ENCOUNTER — Encounter: Payer: Self-pay | Admitting: Gynecology

## 2017-10-19 ENCOUNTER — Other Ambulatory Visit: Payer: Self-pay

## 2017-10-19 ENCOUNTER — Ambulatory Visit: Payer: Medicaid Other

## 2017-10-19 ENCOUNTER — Ambulatory Visit
Admission: EM | Admit: 2017-10-19 | Discharge: 2017-10-19 | Disposition: A | Discharge status: Home / Self Care | Payer: Medicaid Other | Attending: Family Medicine | Admitting: Family Medicine

## 2017-10-19 DIAGNOSIS — Z87891 Personal history of nicotine dependence: Secondary | ICD-10-CM | POA: Insufficient documentation

## 2017-10-19 DIAGNOSIS — I10 Essential (primary) hypertension: Secondary | ICD-10-CM | POA: Insufficient documentation

## 2017-10-19 DIAGNOSIS — Z87442 Personal history of urinary calculi: Secondary | ICD-10-CM | POA: Insufficient documentation

## 2017-10-19 DIAGNOSIS — R0789 Other chest pain: Secondary | ICD-10-CM | POA: Diagnosis present

## 2017-10-19 DIAGNOSIS — W109XXA Fall (on) (from) unspecified stairs and steps, initial encounter: Secondary | ICD-10-CM | POA: Diagnosis not present

## 2017-10-19 DIAGNOSIS — Z79899 Other long term (current) drug therapy: Secondary | ICD-10-CM | POA: Diagnosis not present

## 2017-10-19 DIAGNOSIS — S2232XA Fracture of one rib, left side, initial encounter for closed fracture: Secondary | ICD-10-CM | POA: Diagnosis not present

## 2017-10-19 DIAGNOSIS — B1920 Unspecified viral hepatitis C without hepatic coma: Secondary | ICD-10-CM | POA: Diagnosis not present

## 2017-10-19 DIAGNOSIS — K219 Gastro-esophageal reflux disease without esophagitis: Secondary | ICD-10-CM | POA: Diagnosis not present

## 2017-10-19 DIAGNOSIS — E7800 Pure hypercholesterolemia, unspecified: Secondary | ICD-10-CM | POA: Insufficient documentation

## 2017-10-19 DIAGNOSIS — Z8249 Family history of ischemic heart disease and other diseases of the circulatory system: Secondary | ICD-10-CM | POA: Diagnosis not present

## 2017-10-19 MED ORDER — CYCLOBENZAPRINE HCL 10 MG PO TABS: 10.0000 mg | ORAL_TABLET | Freq: Every day | ORAL | 0 refills | Status: DC

## 2017-10-19 MED ORDER — KETOROLAC TROMETHAMINE 10 MG PO TABS: 10.0000 mg | ORAL_TABLET | Freq: Three times a day (TID) | ORAL | 0 refills | Status: DC | PRN

## 2017-10-19 NOTE — Discharge Instructions (Addendum)
Ice, rest

## 2017-10-19 NOTE — ED Triage Notes (Signed)
Per patient fell about a week ago going up stairs. Per patient now with left side rib pain.
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
## 2018-05-08 DIAGNOSIS — M1711 Unilateral primary osteoarthritis, right knee: Secondary | ICD-10-CM | POA: Insufficient documentation

## 2018-05-13 ENCOUNTER — Ambulatory Visit
Admission: RE | Admit: 2018-05-13 | Discharge: 2018-05-13 | Disposition: A | Discharge status: Home / Self Care | Payer: Medicaid Other | Source: Ambulatory Visit | Attending: Unknown Physician Specialty | Admitting: Unknown Physician Specialty

## 2018-05-13 ENCOUNTER — Other Ambulatory Visit: Payer: Self-pay | Admitting: Unknown Physician Specialty

## 2018-05-13 DIAGNOSIS — G8929 Other chronic pain: Secondary | ICD-10-CM | POA: Insufficient documentation

## 2018-05-13 DIAGNOSIS — M25561 Pain in right knee: Secondary | ICD-10-CM | POA: Insufficient documentation

## 2018-05-21 ENCOUNTER — Other Ambulatory Visit: Payer: Self-pay | Admitting: Unknown Physician Specialty

## 2018-05-21 DIAGNOSIS — G8929 Other chronic pain: Secondary | ICD-10-CM

## 2018-05-21 DIAGNOSIS — M5442 Lumbago with sciatica, left side: Principal | ICD-10-CM

## 2018-05-21 DIAGNOSIS — M5441 Lumbago with sciatica, right side: Principal | ICD-10-CM

## 2018-06-02 ENCOUNTER — Ambulatory Visit: Payer: Medicaid Other

## 2018-06-05 ENCOUNTER — Other Ambulatory Visit: Payer: Self-pay

## 2018-06-05 ENCOUNTER — Encounter: Payer: Self-pay | Admitting: Student in an Organized Health Care Education/Training Program

## 2018-06-05 ENCOUNTER — Ambulatory Visit
Payer: Medicaid Other | Attending: Student in an Organized Health Care Education/Training Program | Admitting: Student in an Organized Health Care Education/Training Program

## 2018-06-05 VITALS — BP 140/75 | HR 105 | Temp 98.5°F | Resp 18 | Ht 68.0 in | Wt 290.0 lb

## 2018-06-05 DIAGNOSIS — M23303 Other meniscus derangements, unspecified medial meniscus, right knee: Secondary | ICD-10-CM | POA: Diagnosis not present

## 2018-06-05 DIAGNOSIS — G8929 Other chronic pain: Secondary | ICD-10-CM | POA: Insufficient documentation

## 2018-06-05 DIAGNOSIS — M25562 Pain in left knee: Secondary | ICD-10-CM | POA: Insufficient documentation

## 2018-06-05 DIAGNOSIS — Z6841 Body Mass Index (BMI) 40.0 and over, adult: Secondary | ICD-10-CM | POA: Diagnosis present

## 2018-06-05 DIAGNOSIS — M1711 Unilateral primary osteoarthritis, right knee: Secondary | ICD-10-CM | POA: Diagnosis present

## 2018-06-05 DIAGNOSIS — G894 Chronic pain syndrome: Secondary | ICD-10-CM | POA: Diagnosis present

## 2018-06-05 DIAGNOSIS — M25561 Pain in right knee: Secondary | ICD-10-CM

## 2018-06-05 NOTE — Progress Notes (Signed)
Safety precautions to be maintained throughout the outpatient stay will include: orient to surroundings, keep bed in low position, maintain call bell within reach at all times, provide assistance with transfer out of bed and ambulation.  

## 2018-06-05 NOTE — Progress Notes (Signed)
Patient's Name: Joshua Petersen  MRN: [id number]  Referring Provider: Leanor Kail, MD  DOB: 1956/04/26  PCP: [name], MD  DOS: 06/05/2018  Note by: Gillis Santa, MD  Service setting: Ambulatory outpatient  Specialty: Interventional Pain Management  Location: [facility] (AMB) Pain Management Facility  Visit type: Initial Patient Evaluation  Patient type: New Patient   Primary Reason(s) for Visit: Encounter for initial evaluation of one or more chronic problems (new to examiner) potentially causing chronic pain, and posing a threat to normal musculoskeletal function. (Level of risk: High) CC: Knee Pain (right)  HPI  Joshua Petersen is a 62 y.o. year old, male patient, who comes today to see [place] for the first time for an initial evaluation of his chronic pain. 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 Today he comes in for evaluation of his Knee Pain (right)  Pain Assessment: Location: Right Knee Radiating: r. lower leg Onset: More than a month ago Duration: Chronic pain Quality: Sharp, Throbbing Severity: 8 /10 (subjective, self-reported pain score)  Note: Reported level is compatible with observation.                         When using our objective Pain Scale, levels between 6 and 10/10 are said to belong in an emergency room, as it progressively worsens from a 6/10, described as severely limiting, requiring emergency care not usually available at an outpatient pain management facility. At a 6/10 level, communication becomes difficult and requires great effort. Assistance to reach the emergency department may be required. Facial flushing and profuse sweating along with potentially dangerous increases in heart rate and blood pressure will be evident. 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 Severity: Getting worse, NAS-11 at its worse: 8/10, NAS-11 at its best: 3/10, NAS-11 now: 7/10 and NAS-11 on the average: 8/10 Timing: Not influenced by the time of the day, During activity or exercise, After activity or exercise and After a period of immobility Aggravating Factors: Climbing, Kneeling, Lifiting, Motion, Prolonged standing, Squatting, Stooping , Twisting, Walking, Walking uphill, Walking downhill and Working Alleviating Factors: Medications Associated Problems: Constipation, Erectile dysfunction, Fatigue, Inability to concentrate, Sweating, Weakness, Pain that wakes patient up and Pain that does not allow patient to sleep Quality of Pain: Agonizing, Constant, Cruel, Disabling, Feeling of weight, Getting longer, Pulsating, Punishing, Sharp, Shooting, Stabbing, Throbbing and Uncomfortable Previous Examinations or Tests: MRI scan Previous Treatments: The patient denies any previous treatments  The patient comes into the clinics today for the first time for a chronic pain management evaluation.   63 year old male with history of morbid obesity, osteoarthritis of multiple joints including history of left hip replacement who presents as a referral from orthopedics at [facility] for right knee genicular nerve block and possible radiofrequency ablation.  Patient is status post right intra-articular steroid injection.  Patient has been told that he needs right knee replacement surgery but has been referred here to possibly postpone that by way of genicular nerve block for right knee pain control.  Patient does have difficulty walking of his right knee.  He states that he has swelling of his right knee as well.  He states that he had fluid drained from his right knee by Dr. Jefm Bryant recently.  Of note patient is on high-dose opioid  therapy.  He has a pain provider interim, Mack Guise.  I clearly informed the patient that he is to continue his chronic pain management with Mack Guise and that he would not be a candidate for chronic opioid therapy here given his history of morbid obesity, symptoms of OSA, high dose MME.  Patient endorsed understanding.  [facility]-track eval only for right knee genicular nerve block and possible radiofrequency ablation.  Medication management to continue with chronic pain provider in [place].  Historic Controlled Substance Pharmacotherapy Review  Historical Background Evaluation: [place] PMP: Six (6) year initial data search conducted.              Risk Assessment Profile:  Alcohol: Positive Male or Male  Illegal Drugs: Positive Male or Male  Rx Drugs: Positive Male or Male  ORT Risk Level calculation: High Risk Opioid Risk Tool - 06/05/18 0810      Family History of Substance Abuse   Alcohol  Positive Male    Illegal Drugs  Negative    Rx Drugs  Negative      Personal History of Substance Abuse   Alcohol  Positive Male or Male    Illegal Drugs  Positive Male or Male    Rx Drugs  Positive Male or Male      Age   Age between 41-45 years   No      History of Preadolescent Sexual Abuse   History of Preadolescent Sexual Abuse  Negative or Male      Psychological Disease   Psychological Disease  Negative    Depression  Negative      Total Score   Opioid Risk Tool Scoring  15    Opioid Risk Interpretation  High Risk      ORT Scoring interpretation table:  Score <3 = Low Risk for SUD  Score between 4-7 = Moderate Risk for SUD  Score >8 = High Risk for Opioid Abuse   PHQ-2 Depression Scale:  Total score: 0  PHQ-2 Scoring interpretation table: (Score and probability of major depressive disorder)  Score 0 = No depression  Score 1 = 15.4% Probability  Score 2 = 21.1% Probability  Score 3 = 38.4% Probability  Score 4 = 45.5% Probability  Score 5 = 56.4%  Probability  Score 6 = 78.6% Probability   PHQ-9 Depression Scale:  Total score: 0  PHQ-9 Scoring interpretation table:  Score 0-4 = No depression  Score 5-9 = Mild depression  Score 10-14 = Moderate depression  Score 15-19 = Moderately severe depression  Score 20-27 = Severe depression (2.4 times higher risk of SUD and 2.89 times higher risk of overuse)    Meds   Current Outpatient Medications:  .  albuterol (PROVENTIL HFA;VENTOLIN HFA) 108 (90 BASE) MCG/ACT inhaler, Inhale 2 puffs into the lungs every 6 (six) hours as needed for wheezing or shortness of breath., Disp: , Rfl:  .  cyclobenzaprine (FLEXERIL) 10 MG tablet, Take 1 tablet (10 mg total) by mouth at bedtime., Disp: 30 tablet, Rfl: 0 .  ketorolac (TORADOL) 10 MG tablet, Take 1 tablet (10 mg total) by mouth every 8 (eight) hours as needed., Disp: 15 tablet, Rfl: 0 .  lisinopril-hydrochlorothiazide (PRINZIDE,ZESTORETIC) 20-12.5 MG per tablet, Take 1 tablet by mouth daily., Disp: , Rfl:  .  oxycodone (ROXICODONE) 30 MG immediate release tablet, Take 30 mg by  mouth See admin instructions. Take 30 mg by mouth up to 6 times daily as needed for pain, Disp: , Rfl:  .  QUEtiapine (SEROQUEL) 50 MG tablet, Take 1 tablet by mouth at bedtime. PT MAY INCREASE TO 2 OR 3 TABS (MAX), Disp: , Rfl:  .  traMADol (ULTRAM) 50 MG tablet, Take 1-2 tablets (50-100 mg total) by mouth every 6 (six) hours as needed for moderate pain., Disp: 40 tablet, Rfl: 0 .  pantoprazole (PROTONIX) 20 MG tablet, Take 20 mg by mouth daily., Disp: , Rfl:   Imaging Review  Lumbosacral Imaging: Lumbar MR wo contrast:  Results for orders placed during the hospital encounter of 09/07/16  MR LUMBAR SPINE WO CONTRAST   Narrative CLINICAL DATA:  Low back and bilateral buttock pain which is chronic.  EXAM: MRI LUMBAR SPINE WITHOUT CONTRAST  TECHNIQUE: Multiplanar, multisequence MR imaging of the lumbar spine was performed. No intravenous contrast was  administered.  COMPARISON:  CT abdomen 07/04/2016  FINDINGS: Segmentation:  5 lumbar type vertebral bodies.  Alignment:  Normal  Vertebrae:  No fracture or primary bone lesion.  Conus medullaris: Extends to the L1-2 level and appears normal.  Paraspinal and other soft tissues: Normal  Disc levels:  T12-L1:  Normal.  L1-2: Minimal desiccation of the disc. No bulge, herniation or stenosis.  L2-3: Minimal desiccation and bulging of the disc. No stenosis or neural compression.  L3-4: Normal appearance of the disc. Minimal facet osteoarthritis without edema. No stenosis.  L4-5: Minimal disc bulge. Mild facet osteoarthritis without edema. No stenosis.  L5-S1: Minimal desiccation and bulging of the disc. Mild facet osteoarthritis without edema. No stenosis.  IMPRESSION: No stenosis or neural compression. Ordinary age related degenerative changes of the lumbar discs without evidence of significant degenerative disc disease. Mild lower lumbar facet osteoarthritis. These findings, though essentially normal for age, could be associated with low back pain.   Electronically Signed   By: Nelson Chimes M.D.   On: 09/07/2016 13:06      Results for orders placed during the hospital encounter of 03/12/17  DG HIP UNILAT W OR W/O PELVIS 2-3 VIEWS LEFT   Narrative CLINICAL DATA:  Status post left hip arthroplasty.  EXAM: DG HIP (WITH OR WITHOUT PELVIS) 2-3V LEFT  COMPARISON:  03/12/2017  FINDINGS: Postoperative changes from a left total hip arthroplasty identified. The hardware components are in anatomic alignment. No periprosthetic fracture or subluxation.  IMPRESSION: 1. No complications status post left hip arthroplasty.   Electronically Signed   By: Kerby Moors M.D.   On: 03/12/2017 11:59    Results for orders placed during the hospital encounter of 05/13/18  MR KNEE RIGHT WO CONTRAST   Narrative CLINICAL DATA:  Chronic right knee pain.  EXAM: MRI OF THE  RIGHT KNEE WITHOUT CONTRAST  TECHNIQUE: Multiplanar, multisequence MR imaging of the knee was performed. No intravenous contrast was administered.  COMPARISON:  None.  FINDINGS: MENISCI  Medial meniscus: There is an extensive horizontal tear involving the entire posterior horn extending toward the posterior aspect of the midbody.  Lateral meniscus:  Intact.  LIGAMENTS  Cruciates:  Intact.  Collaterals: Thickening of the proximal MCL with fluid deep to the MCL consistent with medial bursitis. The thickening is probably due to a remote sprain. The LCL appears normal.  CARTILAGE  Patellofemoral:  Normal.  Medial: There is extensive denuding of the articular cartilage prominent subcortical edema in the femoral condyle and medial tibial plateau.  Lateral: There are focal areas of  partial and full-thickness cartilage loss in posterior aspect of the tibial plateau and in the central portion of the femoral condyle.  Joint: Moderate joint effusion. Slightly thickened medial and suprapatellar plicae.  Popliteal Fossa:  No Baker cyst. Intact popliteus tendon.  Extensor Mechanism:  Intact quadriceps tendon and patellar tendon.  Bones: There is subcortical edema in the periphery of the medial femoral condyle and diffusely in the medial tibial plateau. There is a small focal area of increased subcortical sclerosis the periphery of tibial plateau which may represent an insufficiency fracture, best seen on image 26 of series 12 image 17 of series 11. There is prominent edema in the soft tissues of the medial aspect of the ankle which is felt to be due to the medial compartment disease.  Other: None  IMPRESSION: 1. Extensive horizontal tear of the posterior horn of the medial meniscus. 2. Extensive full-thickness cartilage loss in the medial compartment with subcortical edema in the medial compartment with a probable small insufficiency fracture of the medial tibial  plateau. 3. Focal areas of cartilage loss in the lateral compartment. 4. Medial bursitis and subcutaneous edema at the medial aspect of the knee felt to be secondary to the medial compartment disease.   Electronically Signed   By: Lorriane Shire M.D.   On: 05/13/2018 16:39      Complexity Note: Imaging results reviewed. Results shared with Joshua Petersen, using Layman's terms.                         ROS  Cardiovascular: High blood pressure and Chest pain Pulmonary or Respiratory: Shortness of breath and Snoring  Neurological: No reported neurological signs or symptoms such as seizures, abnormal skin sensations, urinary and/or fecal incontinence, being born with an abnormal open spine and/or a tethered spinal cord Review of Past Neurological Studies: No results found for this or any previous visit. Psychological-Psychiatric: No reported psychological or psychiatric signs or symptoms such as difficulty sleeping, anxiety, depression, delusions or hallucinations (schizophrenial), mood swings (bipolar disorders) or suicidal ideations or attempts Gastrointestinal: Reflux or heatburn Genitourinary: No reported renal or genitourinary signs or symptoms such as difficulty voiding or producing urine, peeing blood, non-functioning kidney, kidney stones, difficulty emptying the bladder, difficulty controlling the flow of urine, or chronic kidney disease Hematological: No reported hematological signs or symptoms such as prolonged bleeding, low or poor functioning platelets, bruising or bleeding easily, hereditary bleeding problems, low energy levels due to low hemoglobin or being anemic Endocrine: No reported endocrine signs or symptoms such as high or low blood sugar, rapid heart rate due to high thyroid levels, obesity or weight gain due to slow thyroid or thyroid disease Rheumatologic: Rheumatoid arthritis Musculoskeletal: Negative for myasthenia gravis, muscular dystrophy, multiple sclerosis or malignant  hyperthermia Work History: Disabled  Allergies  Joshua Petersen has No Known Allergies.  Laboratory Chemistry  Inflammation Markers (CRP: Acute Phase) (ESR: Chronic Phase) No results found for: CRP, ESRSEDRATE, LATICACIDVEN                       Rheumatology Markers No results found for: RF, ANA, LABURIC, URICUR, LYMEIGGIGMAB, LYMEABIGMQN, HLAB27                      Renal Function Markers Lab Results  Component Value Date   BUN 16 10/11/2017   CREATININE 1.01 10/11/2017   GFRAA >60 10/11/2017   GFRNONAA >60 10/11/2017  Hepatic Function Markers Lab Results  Component Value Date   AST 93 (H) 07/04/2016   ALT 92 (H) 07/04/2016   ALBUMIN 4.1 07/04/2016   ALKPHOS 61 07/04/2016   LIPASE 86 (H) 07/04/2016                        Electrolytes Lab Results  Component Value Date   NA 136 10/11/2017   K 3.7 10/11/2017   CL 99 (L) 10/11/2017   CALCIUM 9.5 10/11/2017                        Neuropathy Markers No results found for: VITAMINB12, FOLATE, HGBA1C, HIV                      CNS Tests No results found for: COLORCSF, APPEARCSF, RBCCOUNTCSF, WBCCSF, POLYSCSF, LYMPHSCSF, EOSCSF, PROTEINCSF, GLUCCSF, JCVIRUS, CSFOLI, IGGCSF                      Bone Pathology Markers No results found for: VD25OH, OH607PX1GGY, IR4854OE7, OJ5009FG1, 25OHVITD1, 25OHVITD2, 25OHVITD3, TESTOFREE, TESTOSTERONE                       Coagulation Parameters Lab Results  Component Value Date   INR 0.92 03/05/2017   LABPROT 12.3 03/05/2017   PLT 391 10/11/2017                        Cardiovascular Markers Lab Results  Component Value Date   TROPONINI <0.03 10/11/2017   HGB 14.9 10/11/2017   HCT 43.7 10/11/2017                         CA Markers No results found for: CEA, CA125, LABCA2                      Note: Lab results reviewed.  [place]  Drug: Joshua Petersen  reports no history of drug use. Alcohol:  reports current alcohol use of about 2.0 standard drinks of alcohol  per week. Tobacco:  reports that he has quit smoking. His smoking use included cigarettes and pipe. His smokeless tobacco use includes snuff. Medical:  has a past medical history of Arthritis, Back pain, GERD (gastroesophageal reflux disease), H/O splenectomy, Hepatitis, High cholesterol, History of kidney stones, Hypertension, and Plantar fasciitis. Family: family history includes Hypertension in his father.  Past Surgical History:  Procedure Laterality Date  . HERNIA REPAIR     umbilical  . JOINT REPLACEMENT    . SPLENECTOMY     Age 75  . TOTAL HIP ARTHROPLASTY Left 03/12/2017   Procedure: TOTAL HIP ARTHROPLASTY;  Surgeon: Corky Mull, MD;  Location: [facility] ORS;  Service: Orthopedics;  Laterality: Left;   Active Ambulatory Problems    Diagnosis Date Noted  . 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 Primary osteoarthritis of right knee 05/08/2018  . Morbid obesity with BMI of 40.0-44.9, adult ([place]) 12/28/2015  . Chronic pain of right knee 06/05/2018  . 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 No Resolved Ambulatory Problems   Past Medical History:  Diagnosis Date  . Arthritis   . Back pain   . GERD (gastroesophageal reflux disease)   . H/O splenectomy   . Hepatitis   .  High cholesterol   . History of kidney stones   . Hypertension   . Plantar fasciitis    Constitutional Exam  General appearance: Well nourished, well developed, and well hydrated. In no apparent acute distress Vitals:   06/05/18 0802  BP: 140/75  Pulse: (!) 105  Resp: 18  Temp: 98.5 F (36.9 C)  TempSrc: Oral  SpO2: 94%  Weight: 290 lb (131.5 kg)  Height: 5' 8"  (1.727 m)   BMI Assessment: Estimated body mass index is 44.09 kg/m as calculated from the following:   Height as of this encounter: 5' 8"  (1.727 m).   Weight as of this encounter: 290 lb (131.5 kg).  BMI interpretation table: BMI level  Category Range association with higher incidence of chronic pain  <18 kg/m2 Underweight   18.5-24.9 kg/m2 Ideal body weight   25-29.9 kg/m2 Overweight Increased incidence by 20%  30-34.9 kg/m2 Obese (Class I) Increased incidence by 68%  35-39.9 kg/m2 Severe obesity (Class II) Increased incidence by 136%  >40 kg/m2 Extreme obesity (Class III) Increased incidence by 254%   Patient's current BMI Ideal Body weight  Body mass index is 44.09 kg/m. Ideal body weight: 68.4 kg (150 lb 12.7 oz) Adjusted ideal body weight: 93.7 kg (206 lb 7.6 oz)   BMI Readings from Last 4 Encounters:  06/05/18 44.09 kg/m  10/19/17 44.85 kg/m  10/11/17 44.85 kg/m  03/28/17 45.46 kg/m   Wt Readings from Last 4 Encounters:  06/05/18 290 lb (131.5 kg)  10/19/17 295 lb (133.8 kg)  10/11/17 295 lb (133.8 kg)  03/28/17 299 lb (135.6 kg)  Psych/Mental status: Alert, oriented x 3 (person, place, & time)       Eyes: PERLA Respiratory: No evidence of acute respiratory distress     Lumbar Spine Area Exam  Skin & Axial Inspection: No masses, redness, or swelling Alignment: Symmetrical Functional ROM: Decreased ROM       Stability: No instability detected Muscle Tone/Strength: Functionally intact. No obvious neuro-muscular anomalies detected. Sensory (Neurological): Musculoskeletal pain pattern Palpation: No palpable anomalies       Provocative Tests: Hyperextension/rotation test: (+) due to pain. Lumbar quadrant test (Kemp's test): deferred today       Lateral bending test: deferred today       Patrick's Maneuver: deferred today                   FABER* test: deferred today                   S-I anterior distraction/compression test: deferred today         S-I lateral compression test: deferred today         S-I Thigh-thrust test: deferred today         S-I Gaenslen's test: deferred today         *(Flexion, ABduction and External Rotation)  Gait & Posture Assessment  Ambulation: Patient ambulates  using a cane Gait: Limited. Using assistive device to ambulate Posture: Poor   Lower Extremity Exam    Side: Right lower extremity  Side: Left lower extremity  Stability: No instability observed          Stability: No instability observed          Skin & Extremity Inspection: Skin color, temperature, and hair growth are WNL. No peripheral edema or cyanosis. No masses, redness, swelling, asymmetry, or associated skin lesions. No contractures.  Skin & Extremity Inspection: Skin color, temperature, and hair growth are WNL. No  peripheral edema or cyanosis. No masses, redness, swelling, asymmetry, or associated skin lesions. No contractures.  Functional ROM: Unrestricted ROM                  Functional ROM: Decreased ROM for all joints of the lower extremity          Muscle Tone/Strength: Functionally intact. No obvious neuro-muscular anomalies detected.  Muscle Tone/Strength: Functionally intact. No obvious neuro-muscular anomalies detected.  Sensory (Neurological): Unimpaired        Sensory (Neurological): Articular pain pattern        DTR: Patellar: deferred today Achilles: deferred today Plantar: deferred today  DTR: Patellar: 0: absent Achilles: 0: absent Plantar: deferred today  Palpation: No palpable anomalies  Palpation: No palpable anomalies   Assessment  Primary Diagnosis & Pertinent Problem List: The primary encounter diagnosis was Chronic pain of right knee. 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  Visit Diagnosis (New problems to examiner): 1. Chronic pain of right knee   2. Primary osteoarthritis of right knee   3. Morbid obesity with BMI of 40.0-44.9, adult ([place])   4. 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 Chronic pain syndrome    Plan of Care (Initial workup plan)   Severe  right knee osteoarthritis.  Candidate for right knee replacement surgery.  Has been referred to [place] [facility] for right knee genicular nerve block and possible radiofrequency ablation.  Risks and benefits discussed and patient would like to proceed.  Patient to continue chronic pain management with his chronic pain provider in [place].  Ordered Lab-work, Procedure(s), Referral(s), & Consult(s): Orders Placed This Encounter  Procedures  . GENICULAR NERVE BLOCK    Interventional management options: Joshua Petersen was informed that there is no guarantee that he would be a candidate for interventional therapies. The decision will be based on the results of diagnostic studies, as well as Joshua Petersen risk profile.  Procedure(s) under consideration:  Right knee genicular nerve block and possible radiofrequency ablation   Provider-requested follow-up: Return for Procedure.  Future Appointments  Date Time Provider [place]  06/09/2018 10:00 AM Gillis Santa, MD [facility] None  06/17/2018  8:00 AM [facility]    Primary Care Physician: [name], MD Location: [facility] Note by: Gillis Santa, M.D, Date: 06/05/2018; Time: 9:09 AM  Patient Instructions  ____________________________________________________________________________________________  Genicular Nerve Block  What is a genicular nerve block? A genicular nerve block is the injection of a local anesthetic to block the nerves that transmits pain from the knee.  What is the purpose of a facet nerve block? A genicular nerve block is a diagnostic procedure to determine if the pathologic changes (i.e. arthritis, meniscal tears, etc) and inflammation within the knee joint is the source of your knee pain. It also confirms that the knee pain will respond well to the actual treatment procedure. If a genicular nerve block works, it will give you relief for several hours. After that, the  pain is expected to return to normal. This test is always performed twice (usually a week or two apart) because two successful tests are required to move onto treatment. If both diagnostic tests are positive, then we schedule a treatment called radiofrequency (RF) ablation. In this procedure, the same nerves are cauterized, which typically leads to pain relief  for 4 -18 months. If this process works well for one knee, it can be performed on the other knee if needed.  How is the procedure performed? You will be placed on the procedure table. The injection site is sterilized with either iodine or chlorhexadine. The site to be injected is numbed with a local anesthetic, and a needle is directed to the target area. X-ray guidance is used to ensure proper placement and positioning of the needle. When the needle is properly positioned near the genicular nerve, local anesthetic is injected to numb that nerve. This will be repeated at multiple sites around the knee to block all genicular nerves.  Will the procedure be painful? The injection can be painful and we therefore provide the option of receiving IV sedation. IV sedation, combined with local anesthetic, can make the injection nearly pain free. It allows you to remain very still during the procedure, which can also make the injection easier, faster, and more successful. If you decide to have IV sedation, you must have a driver to get you home safely afterwards. In addition, you cannot have anything to eat or drink within 8 hours of your appointment (clear liquids are allowed until 3 hours before the procedure). If you take medications for diabetes, these medications may need to be adjusted the morning of the procedure. Your primary care physician can help you with this adjustment.  What are the discharge instructions? If you received IV sedation do not drive or operate machinery for at least 24 hours after the procedure. You may return to work the next day  following your procedure. You may resume your normal diet immediately. Do not engage in any strenuous activity for 24 hours. You should, however, engage in moderate activity that typically causes your ususal pain. If the block works, those activities should not be painful for several hours after the injection. Do not take a bath, swim, or use a hot tub for 24 hours (you may take a shower). Call the office if you have any of the following: severe pain afterwards (different than your usual symptoms), redness/swelling/discharge at the injection site(s), fevers/chills, difficulty with bowel or bladder functions.  What are the risks and side effects? The complication rate for this procedure is very low. Whenever a needle enters the skin, bleeding or infection can occur. Some other serious but extremely rare risks include paralysis and death. You may have an allergic reaction to any of the medications used. If you have a known allergy to any medications, especially local anesthetics, notify our staff before the procedure takes place. You may experience any of the following side effects up to 4 - 6 hours after the procedure: . Leg muscle weakness or numbness may occur due to the local anesthetic affecting the nerves that control your legs (this is a temporary affect and it is not paralysis). If you have any leg weakness or numbness, walk only with assistance in order to prevent falls and injury. Your leg strength will return slowly and completely. . Dizziness may occur due to a decrease in your blood pressure. If this occurs, remain in a seated or lying position. Gradually sit up, and then stand after at least 10 minutes of sitting. . Mild headaches may occur. Drink fluids and take pain medications if needed. If the headaches persist or become severe, call the office. . Mild discomfort at the injection site can occur. This typically lasts for a few hours but can persist for a couple days. If this  occurs, take  anti-inflammatories or pain medications, apply ice to the area the day of the procedure. If it persists, apply moist heat in the day(s) following.  The side effects listed above can be normal. They are not dangerous and will resolve on their own. If, however, you experience any of the following, a complication may have occurred and you should either contact your doctor. If he is not readily available, then you should proceed to the closest urgent care center for evaluation: . Severe or progressive pain at the injection site(s) . Arm or leg weakness that progressively worsens or persists for longer than 8 hours . Severe or progressive redness, swelling, or discharge from the injections site(s) . Fevers, chills, nausea, or vomiting . Bowel or bladder dysfunction (i.e. inability to urinate or pass stool or difficulty controlling either)  How long does it take for the procedure to work? You should feel relief from your usual pain within the first hour. Again, this is only expected to last for several hours, at the most. Remember, you may be sore in the middle part of your back from the needles, and you must distinguish this from your usual pain. ____________________________________________________________________________________________  ____________________________________________________________________________________________  Preparing for Procedure with Sedation  Instructions: . Oral Intake: Do not eat or drink anything for at least 8 hours prior to your procedure. . Transportation: Public transportation is not allowed. Bring an adult driver. The driver must be physically present in our waiting room before any procedure can be started. Marland Kitchen Physical Assistance: Bring an adult physically capable of assisting you, in the event you need help. This adult should keep you company at home for at least 6 hours after the procedure. . Blood Pressure Medicine: Take your blood pressure medicine with a sip of  water the morning of the procedure. . Blood thinners: Notify our staff if you are taking any blood thinners. Depending on which one you take, there will be specific instructions on how and when to stop it. . Diabetics on insulin: Notify the staff so that you can be scheduled 1st case in the morning. If your diabetes requires high dose insulin, take only  of your normal insulin dose the morning of the procedure and notify the staff that you have done so. . Preventing infections: Shower with an antibacterial soap the morning of your procedure. . Build-up your immune system: Take 1000 mg of Vitamin C with every meal (3 times a day) the day prior to your procedure. Marland Kitchen Antibiotics: Inform the staff if you have a condition or reason that requires you to take antibiotics before dental procedures. . Pregnancy: If you are pregnant, call and cancel the procedure. . Sickness: If you have a cold, fever, or any active infections, call and cancel the procedure. . Arrival: You must be in the facility at least 30 minutes prior to your scheduled procedure. . Children: Do not bring children with you. . Dress appropriately: Bring dark clothing that you would not mind if they get stained. . Valuables: Do not bring any jewelry or valuables.  Procedure appointments are reserved for interventional treatments only. Marland Kitchen No Prescription Refills. . No medication changes will be discussed during procedure appointments. . No disability issues will be discussed.  Reasons to call and reschedule or cancel your procedure: (Following these recommendations will minimize the risk of a serious complication.) . Surgeries: Avoid having procedures within 2 weeks of any surgery. (Avoid for 2 weeks before or after any surgery). . Flu Shots: Avoid having  procedures within 2 weeks of a flu shots or . (Avoid for 2 weeks before or after immunizations). . Barium: Avoid having a procedure within 7-10 days after having had a radiological study  involving the use of radiological contrast. (Myelograms, Barium swallow or enema study). . Heart attacks: Avoid any elective procedures or surgeries for the initial 6 months after a "Myocardial Infarction" (Heart Attack). . Blood thinners: It is imperative that you stop these medications before procedures. Let us know if you if you take any blood thinner.  . Infection: Avoid procedures during or within two weeks of an infection (including chest colds or gastrointestinal problems). Symptoms associated with infections include: Localized redness, fever, chills, night sweats or profuse sweating, burning sensation when voiding, cough, congestion, stuffiness, runny nose, sore throat, diarrhea, nausea, vomiting, cold or Flu symptoms, recent or current infections. It is specially important if the infection is over the area that we intend to treat. Marland Kitchen Heart and lung problems: Symptoms that may suggest an active cardiopulmonary problem include: cough, chest pain, breathing difficulties or shortness of breath, dizziness, ankle swelling, uncontrolled high or unusually low blood pressure, and/or palpitations. If you are experiencing any of these symptoms, cancel your procedure and contact your primary care physician for an evaluation.  Remember:  Regular Business hours are:  Monday to Thursday 8:00 AM to 4:00 PM  Provider's Schedule: Milinda Pointer, MD:  Procedure days: Tuesday and Thursday 7:30 AM to 4:00 PM  Gillis Santa, MD:  Procedure days: Monday and Wednesday 7:30 AM to 4:00 PM ____________________________________________________________________________________________

## 2018-06-05 NOTE — Patient Instructions (Signed)
____________________________________________________________________________________________  Genicular Nerve Block  What is a genicular nerve block? A genicular nerve block is the injection of a local anesthetic to block the nerves that transmits pain from the knee.  What is the purpose of a facet nerve block? A genicular nerve block is a diagnostic procedure to determine if the pathologic changes (i.e. arthritis, meniscal tears, etc) and inflammation within the knee joint is the source of your knee pain. It also confirms that the knee pain will respond well to the actual treatment procedure. If a genicular nerve block works, it will give you relief for several hours. After that, the pain is expected to return to normal. This test is always performed twice (usually a week or two apart) because two successful tests are required to move onto treatment. If both diagnostic tests are positive, then we schedule a treatment called radiofrequency (RF) ablation. In this procedure, the same nerves are cauterized, which typically leads to pain relief for 4 -18 months. If this process works well for one knee, it can be performed on the other knee if needed.  How is the procedure performed? You will be placed on the procedure table. The injection site is sterilized with either iodine or chlorhexadine. The site to be injected is numbed with a local anesthetic, and a needle is directed to the target area. X-ray guidance is used to ensure proper placement and positioning of the needle. When the needle is properly positioned near the genicular nerve, local anesthetic is injected to numb that nerve. This will be repeated at multiple sites around the knee to block all genicular nerves.  Will the procedure be painful? The injection can be painful and we therefore provide the option of receiving IV sedation. IV sedation, combined with local anesthetic, can make the injection nearly pain free. It allows you to remain very  still during the procedure, which can also make the injection easier, faster, and more successful. If you decide to have IV sedation, you must have a driver to get you home safely afterwards. In addition, you cannot have anything to eat or drink within 8 hours of your appointment (clear liquids are allowed until 3 hours before the procedure). If you take medications for diabetes, these medications may need to be adjusted the morning of the procedure. Your primary care physician can help you with this adjustment.  What are the discharge instructions? If you received IV sedation do not drive or operate machinery for at least 24 hours after the procedure. You may return to work the next day following your procedure. You may resume your normal diet immediately. Do not engage in any strenuous activity for 24 hours. You should, however, engage in moderate activity that typically causes your ususal pain. If the block works, those activities should not be painful for several hours after the injection. Do not take a bath, swim, or use a hot tub for 24 hours (you may take a shower). Call the office if you have any of the following: severe pain afterwards (different than your usual symptoms), redness/swelling/discharge at the injection site(s), fevers/chills, difficulty with bowel or bladder functions.  What are the risks and side effects? The complication rate for this procedure is very low. Whenever a needle enters the skin, bleeding or infection can occur. Some other serious but extremely rare risks include paralysis and death. You may have an allergic reaction to any of the medications used. If you have a known allergy to any medications, especially local anesthetics, notify   our staff before the procedure takes place. You may experience any of the following side effects up to 4 - 6 hours after the procedure: . Leg muscle weakness or numbness may occur due to the local anesthetic affecting the nerves that control  your legs (this is a temporary affect and it is not paralysis). If you have any leg weakness or numbness, walk only with assistance in order to prevent falls and injury. Your leg strength will return slowly and completely. . Dizziness may occur due to a decrease in your blood pressure. If this occurs, remain in a seated or lying position. Gradually sit up, and then stand after at least 10 minutes of sitting. . Mild headaches may occur. Drink fluids and take pain medications if needed. If the headaches persist or become severe, call the office. . Mild discomfort at the injection site can occur. This typically lasts for a few hours but can persist for a couple days. If this occurs, take anti-inflammatories or pain medications, apply ice to the area the day of the procedure. If it persists, apply moist heat in the day(s) following.  The side effects listed above can be normal. They are not dangerous and will resolve on their own. If, however, you experience any of the following, a complication may have occurred and you should either contact your doctor. If he is not readily available, then you should proceed to the closest urgent care center for evaluation: . Severe or progressive pain at the injection site(s) . Arm or leg weakness that progressively worsens or persists for longer than 8 hours . Severe or progressive redness, swelling, or discharge from the injections site(s) . Fevers, chills, nausea, or vomiting . Bowel or bladder dysfunction (i.e. inability to urinate or pass stool or difficulty controlling either)  How long does it take for the procedure to work? You should feel relief from your usual pain within the first hour. Again, this is only expected to last for several hours, at the most. Remember, you may be sore in the middle part of your back from the needles, and you must distinguish this from your usual  pain. ____________________________________________________________________________________________  ____________________________________________________________________________________________  Preparing for Procedure with Sedation  Instructions: . Oral Intake: Do not eat or drink anything for at least 8 hours prior to your procedure. . Transportation: Public transportation is not allowed. Bring an adult driver. The driver must be physically present in our waiting room before any procedure can be started. . Physical Assistance: Bring an adult physically capable of assisting you, in the event you need help. This adult should keep you company at home for at least 6 hours after the procedure. . Blood Pressure Medicine: Take your blood pressure medicine with a sip of water the morning of the procedure. . Blood thinners: Notify our staff if you are taking any blood thinners. Depending on which one you take, there will be specific instructions on how and when to stop it. . Diabetics on insulin: Notify the staff so that you can be scheduled 1st case in the morning. If your diabetes requires high dose insulin, take only  of your normal insulin dose the morning of the procedure and notify the staff that you have done so. . Preventing infections: Shower with an antibacterial soap the morning of your procedure. . Build-up your immune system: Take 1000 mg of Vitamin C with every meal (3 times a day) the day prior to your procedure. . Antibiotics: Inform the staff if you have a   condition or reason that requires you to take antibiotics before dental procedures. . Pregnancy: If you are pregnant, call and cancel the procedure. . Sickness: If you have a cold, fever, or any active infections, call and cancel the procedure. . Arrival: You must be in the facility at least 30 minutes prior to your scheduled procedure. . Children: Do not bring children with you. . Dress appropriately: Bring dark clothing that you  would not mind if they get stained. . Valuables: Do not bring any jewelry or valuables.  Procedure appointments are reserved for interventional treatments only. . No Prescription Refills. . No medication changes will be discussed during procedure appointments. . No disability issues will be discussed.  Reasons to call and reschedule or cancel your procedure: (Following these recommendations will minimize the risk of a serious complication.) . Surgeries: Avoid having procedures within 2 weeks of any surgery. (Avoid for 2 weeks before or after any surgery). . Flu Shots: Avoid having procedures within 2 weeks of a flu shots or . (Avoid for 2 weeks before or after immunizations). . Barium: Avoid having a procedure within 7-10 days after having had a radiological study involving the use of radiological contrast. (Myelograms, Barium swallow or enema study). . Heart attacks: Avoid any elective procedures or surgeries for the initial 6 months after a "Myocardial Infarction" (Heart Attack). . Blood thinners: It is imperative that you stop these medications before procedures. Let us know if you if you take any blood thinner.  . Infection: Avoid procedures during or within two weeks of an infection (including chest colds or gastrointestinal problems). Symptoms associated with infections include: Localized redness, fever, chills, night sweats or profuse sweating, burning sensation when voiding, cough, congestion, stuffiness, runny nose, sore throat, diarrhea, nausea, vomiting, cold or Flu symptoms, recent or current infections. It is specially important if the infection is over the area that we intend to treat. . Heart and lung problems: Symptoms that may suggest an active cardiopulmonary problem include: cough, chest pain, breathing difficulties or shortness of breath, dizziness, ankle swelling, uncontrolled high or unusually low blood pressure, and/or palpitations. If you are experiencing any of these symptoms,  cancel your procedure and contact your primary care physician for an evaluation.  Remember:  Regular Business hours are:  Monday to Thursday 8:00 AM to 4:00 PM  Provider's Schedule: Francisco Naveira, MD:  Procedure days: Tuesday and Thursday 7:30 AM to 4:00 PM  Bilal Lateef, MD:  Procedure days: Monday and Wednesday 7:30 AM to 4:00 PM ____________________________________________________________________________________________    

## 2018-06-09 ENCOUNTER — Encounter: Payer: Self-pay | Admitting: Student in an Organized Health Care Education/Training Program

## 2018-06-09 ENCOUNTER — Ambulatory Visit
Admission: RE | Admit: 2018-06-09 | Discharge: 2018-06-09 | Disposition: A | Discharge status: Home / Self Care | Payer: Medicaid Other | Source: Ambulatory Visit | Attending: Student in an Organized Health Care Education/Training Program | Admitting: Student in an Organized Health Care Education/Training Program

## 2018-06-09 ENCOUNTER — Ambulatory Visit: Payer: Medicaid Other | Admitting: Student in an Organized Health Care Education/Training Program

## 2018-06-09 ENCOUNTER — Other Ambulatory Visit: Payer: Self-pay

## 2018-06-09 VITALS — BP 145/85 | HR 93 | Temp 98.2°F | Resp 16 | Ht 68.0 in | Wt 290.0 lb

## 2018-06-09 DIAGNOSIS — G8929 Other chronic pain: Secondary | ICD-10-CM

## 2018-06-09 DIAGNOSIS — M25561 Pain in right knee: Secondary | ICD-10-CM | POA: Insufficient documentation

## 2018-06-09 DIAGNOSIS — M1711 Unilateral primary osteoarthritis, right knee: Secondary | ICD-10-CM | POA: Diagnosis present

## 2018-06-09 MED ORDER — ROPIVACAINE HCL 2 MG/ML IJ SOLN
10.0000 mL | Freq: Once | INTRAMUSCULAR | Status: AC
  Administered 2018-06-09: 10 mL
  Filled 2018-06-09: qty 10

## 2018-06-09 MED ORDER — LIDOCAINE HCL 2 % IJ SOLN
20.0000 mL | Freq: Once | INTRAMUSCULAR | Status: AC
  Administered 2018-06-09: 400 mg
  Filled 2018-06-09: qty 20

## 2018-06-09 MED ORDER — DEXAMETHASONE SODIUM PHOSPHATE 10 MG/ML IJ SOLN
10.0000 mg | Freq: Once | INTRAMUSCULAR | Status: AC
  Administered 2018-06-09: 10 mg
  Filled 2018-06-09: qty 1

## 2018-06-09 NOTE — Progress Notes (Signed)
Patient's Name: Joshua Petersen  MRN: [id number]  Referring Provider: Dione Housekeeper, *  DOB: 1955/08/26  PCP: Dione Housekeeper, MD  DOS: 06/09/2018  Note by: Edward Jolly, MD  Service setting: Ambulatory outpatient  Specialty: Interventional Pain Management  Patient type: Established  Location: [facility] (AMB) Pain Management Facility  Visit type: Interventional Procedure   Primary Reason for Visit: Interventional Pain Management Treatment. CC: Knee Pain (right)  Procedure:          Anesthesia, Analgesia, Anxiolysis:  Type: Genicular Nerves Block (Superior-lateral, Superior-medial, and Inferior-medial Genicular Nerves)         As well as right knee joint arthrocentesis for knee effusion and fluid CPT: 64450      Primary Purpose: Diagnostic Region: Lateral, Anterior, and Medial aspects of the knee joint, above and below the knee joint proper. Level: Superior and inferior to the knee joint. Target Area: For Genicular Nerve block(s), the targets are: the superior-lateral genicular nerve, located in the lateral distal portion of the femoral shaft as it curves to form the lateral epicondyle, in the region of the distal femoral metaphysis; the superior-medial genicular nerve, located in the medial distal portion of the femoral shaft as it curves to form the medial epicondyle; and the inferior-medial genicular nerve, located in the medial, proximal portion of the tibial shaft, as it curves to form the medial epicondyle, in the region of the proximal tibial metaphysis. Approach: Anterior, percutaneous, ipsilateral approach. Laterality: Right knee  Type: Local Anesthesia Indication(s): Analgesia         Route: Infiltration ([place]/IM) IV Access: Declined Sedation: Declined  Local Anesthetic: Lidocaine 1-2%  Position: Modified Fowler's position with pillows under the targeted knee(s).   Indications: 1. Chronic pain of right knee   2. Primary osteoarthritis of right knee    Pain  Score: Pre-procedure: 3 /10 Post-procedure: 2 /10  Pre-op Assessment:  Mr. Rushin is a 63 y.o. (year old), male patient, seen today for interventional treatment. 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 Mr. Kirkeby has a current medication list which includes the following prescription(s): albuterol, cyclobenzaprine, ketorolac, lisinopril-hydrochlorothiazide, oxycodone, pantoprazole, quetiapine, and tramadol. His primarily concern today is the Knee Pain (right)  Initial Vital Signs:  Pulse/HCG Rate: 93ECG Heart Rate: 96 Temp: 98.2 F (36.8 C) Resp: 16 BP: 113/86 SpO2: 96 %  BMI: Estimated body mass index is 44.09 kg/m as calculated from the following:   Height as of this encounter: 5\' 8"  (1.727 m).   Weight as of this encounter: 290 lb (131.5 kg).  Risk Assessment: Allergies: Reviewed. He has No Known Allergies.  Allergy Precautions: None required Coagulopathies: Reviewed. None identified.  Blood-thinner therapy: None at this time Active Infection(s): Reviewed. None identified. Mr. Venn is afebrile  Site Confirmation: Mr. Edwardsen was asked to confirm the procedure and laterality before marking the site Procedure checklist: Completed Consent: Before the procedure and under the influence of no sedative(s), amnesic(s), or anxiolytics, the patient was informed of the treatment options, risks and possible complications. To fulfill our ethical and legal obligations, as recommended by the American Medical Association's Code of Ethics, I have informed the patient of my clinical impression; the nature and purpose of the treatment or procedure; the risks, benefits, and possible complications of the intervention; the alternatives, including doing nothing; the risk(s) and benefit(s) of the alternative treatment(s) or procedure(s); and the risk(s) and benefit(s) of doing nothing. The patient was provided  information about the general risks and possible complications associated with the procedure. These may include, but are not limited to: failure to achieve desired goals, infection, bleeding, organ or nerve damage, allergic reactions, paralysis, and death. In addition, the patient was informed of those risks and complications associated to the procedure, such as failure to decrease pain; infection; bleeding; organ or nerve damage with subsequent damage to sensory, motor, and/or autonomic systems, resulting in permanent pain, numbness, and/or weakness of one or several areas of the body; allergic reactions; (i.e.: anaphylactic reaction); and/or death. Furthermore, the patient was informed of those risks and complications associated with the medications. These include, but are not limited to: allergic reactions (i.e.: anaphylactic or anaphylactoid reaction(s)); adrenal axis suppression; blood sugar elevation that in diabetics may result in ketoacidosis or comma; water retention that in patients with history of congestive heart failure may result in shortness of breath, pulmonary edema, and decompensation with resultant heart failure; weight gain; swelling or edema; medication-induced neural toxicity; particulate matter embolism and blood vessel occlusion with resultant organ, and/or nervous system infarction; and/or aseptic necrosis of one or more joints. Finally, the patient was informed that Medicine is not an exact science; therefore, there is also the possibility of unforeseen or unpredictable risks and/or possible complications that may result in a catastrophic outcome. The patient indicated having understood very clearly. We have given the patient no guarantees and we have made no promises. Enough time was given to the patient to ask questions, all of which were answered to the patient's satisfaction. Mr. Stater has indicated that he wanted to continue with the procedure. Attestation: I, the ordering provider,  attest that I have discussed with the patient the benefits, risks, side-effects, alternatives, likelihood of achieving goals, and potential problems during recovery for the procedure that I have provided informed consent. Date  Time: 06/09/2018  9:50 AM  Pre-Procedure Preparation:  Monitoring: As per clinic protocol. Respiration, ETCO2, SpO2, BP, heart rate and rhythm monitor placed and checked for adequate function Safety Precautions: Patient was assessed for positional comfort and pressure points before starting the procedure. Time-out: I initiated and conducted the "Time-out" before starting the procedure, as per protocol. The patient was asked to participate by confirming the accuracy of the "Time Out" information. Verification of the correct person, site, and procedure were performed and confirmed by me, the nursing staff, and the patient. "Time-out" conducted as per Joint Commission's Universal Protocol (UP.01.01.01). Time: 1050  Description of Procedure:          Area Prepped: Entire knee area, from mid-thigh to mid-shin, lateral, anterior, and medial aspects. Prepping solution: ChloraPrep (2% chlorhexidine gluconate and 70% isopropyl alcohol) Safety Precautions: Aspiration looking for blood return was conducted prior to all injections. At no point did we inject any substances, as a needle was being advanced. No attempts were made at seeking any paresthesias. Safe injection practices and needle disposal techniques used. Medications properly checked for expiration dates. SDV (single dose vial) medications used. Description of the Procedure: Protocol guidelines were followed. The patient was placed in position over the procedure table. The target area was identified and the area prepped in the usual manner. Skin & deeper tissues infiltrated with local anesthetic. Appropriate amount of time allowed to pass for local anesthetics to take effect. The procedure needles were then advanced to the target  area. Proper needle placement secured. Negative aspiration confirmed. Solution injected in intermittent fashion, asking for systemic symptoms every 0.5cc of injectate. The needles were then removed and  the area cleansed, making sure to leave some of the prepping solution back to take advantage of its long term bactericidal properties.  Vitals:   06/09/18 1050 06/09/18 1054 06/09/18 1100 06/09/18 1103  BP: 131/82 132/87 133/80 (!) 145/85  Pulse:      Resp: 13 19 14 16   Temp:      TempSrc:      SpO2: 92% 92% 93% 94%  Weight:      Height:        Start Time: 1050 hrs. End Time: 1102 hrs. Materials:  Needle(s) Type: Spinal Needle Gauge: 22G Length: 3.5-in Medication(s): Please see orders for medications and dosing details. 5 cc solution made of 4 cc of 0.2% prilocaine, 1 cc of Decadron 10 mg/cc.  1.5 cc injected at each level above.  At this time, approximately 5 cc of fluid at the superior lateral femoral condyle was removed. Imaging Guidance (Non-Spinal):          Type of Imaging Technique: Fluoroscopy Guidance (Non-Spinal) Indication(s): Assistance in needle guidance and placement for procedures requiring needle placement in or near specific anatomical locations not easily accessible without such assistance. Exposure Time: Please see nurses notes. Contrast: None used. Fluoroscopic Guidance: I was personally present during the use of fluoroscopy. "Tunnel Vision Technique" used to obtain the best possible view of the target area. Parallax error corrected before commencing the procedure. "Direction-depth-direction" technique used to introduce the needle under continuous pulsed fluoroscopy. Once target was reached, antero-posterior, oblique, and lateral fluoroscopic projection used confirm needle placement in all planes. Images permanently stored in EMR. Interpretation: No contrast injected. I personally interpreted the imaging intraoperatively. Adequate needle placement confirmed in multiple  planes. Permanent images saved into the patient's record.  Antibiotic Prophylaxis:   Anti-infectives (From admission, onward)   None     Indication(s): None identified  Post-operative Assessment:  Post-procedure Vital Signs:  Pulse/HCG Rate: 9392 Temp: 98.2 F (36.8 C) Resp: 16 BP: (!) 145/85 SpO2: 94 %  EBL: None  Complications: No immediate post-treatment complications observed by team, or reported by patient.  Note: The patient tolerated the entire procedure well. A repeat set of vitals were taken after the procedure and the patient was kept under observation following institutional policy, for this type of procedure. Post-procedural neurological assessment was performed, showing return to baseline, prior to discharge. The patient was provided with post-procedure discharge instructions, including a section on how to identify potential problems. Should any problems arise concerning this procedure, the patient was given instructions to immediately contact us, at any time, without hesitation. In any case, we plan to contact the patient by telephone for a follow-up status report regarding this interventional procedure.  Comments:  No additional relevant information.  Plan of Care    Imaging Orders     DG C-Arm 1-60 Min-No Report Procedure Orders    No procedure(s) ordered today    Medications ordered for procedure: Meds ordered this encounter  Medications  . ropivacaine (PF) 2 mg/mL (0.2%) (NAROPIN) injection 10 mL  . lidocaine (XYLOCAINE) 2 % (with pres) injection 400 mg  . dexamethasone (DECADRON) injection 10 mg   Medications administered: We administered ropivacaine (PF) 2 mg/mL (0.2%), lidocaine, and dexamethasone.  See the medical record for exact dosing, route, and time of administration.  Disposition: Discharge home  Discharge Date & Time: 06/09/2018;   hrs.   Physician-requested Follow-up: Return in about 3 weeks (around 06/30/2018) for Post Procedure  Evaluation.  Future Appointments  Date Time Provider Department Center  06/17/2018  8:00 AM [facility]  06/30/2018  2:00 PM Edward Jolly, MD [facility] None   Primary Care Physician: Dione Housekeeper, MD Location: [facility] Note by: Edward Jolly, MD Date: 06/09/2018; Time: 11:36 AM  Disclaimer:  Medicine is not an exact science. The only guarantee in medicine is that nothing is guaranteed. It is important to note that the decision to proceed with this intervention was based on the information collected from the patient. The Data and conclusions were drawn from the patient's questionnaire, the interview, and the physical examination. Because the information was provided in large part by the patient, it cannot be guaranteed that it has not been purposely or unconsciously manipulated. Every effort has been made to obtain as much relevant data as possible for this evaluation. It is important to note that the conclusions that lead to this procedure are derived in large part from the available data. Always take into account that the treatment will also be dependent on availability of resources and existing treatment guidelines, considered by other Pain Management Practitioners as being common knowledge and practice, at the time of the intervention. For Medico-Legal purposes, it is also important to point out that variation in procedural techniques and pharmacological choices are the acceptable norm. The indications, contraindications, technique, and results of the above procedure should only be interpreted and judged by a Board-Certified Interventional Pain Specialist with extensive familiarity and expertise in the same exact procedure and technique.

## 2018-06-09 NOTE — Progress Notes (Signed)
Safety precautions to be maintained throughout the outpatient stay will include: orient to surroundings, keep bed in low position, maintain call bell within reach at all times, provide assistance with transfer out of bed and ambulation.  

## 2018-06-09 NOTE — Patient Instructions (Addendum)
____________________________________________________________________________________________  Post-procedure Information What to expect: Most procedures involve the use of a local anesthetic (numbing medicine), and a steroid (anti-inflammatory medicine).  The local anesthetics may cause temporary numbness and weakness of the legs or arms, depending on the location of the block. This numbness/weakness may last 4-6 hours, depending on the local anesthetic used. In rare instances, it can last up to 24 hours. While numb, you must be very careful not to injure the extremity.  After any procedure, you could expect the pain to get better within 15-20 minutes. This relief is temporary and may last 4-6 hours. Once the local anesthetics wears off, you could experience discomfort, possibly more than usual, for up to 10 (ten) days. In the case of radiofrequencies, it may last up to 6 weeks. Surgeries may take up to 8 weeks for the healing process. The discomfort is due to the irritation caused by needles going through skin and muscle. To minimize the discomfort, we recommend using ice the first day, and heat from then on. The ice should be applied for 15 minutes on, and 15 minutes off. Keep repeating this cycle until bedtime. Avoid applying the ice directly to the skin, to prevent frostbite. Heat should be used daily, until the pain improves (4-10 days). Be careful not to burn yourself.  Occasionally you may experience muscle spasms or cramps. These occur as a consequence of the irritation caused by the needle sticks to the muscle and the blood that will inevitably be lost into the surrounding muscle tissue. Blood tends to be very irritating to tissues, which tend to react by going into spasm. These spasms may start the same day of your procedure, but they may also take days to develop. This late onset type of spasm or cramp is usually caused by electrolyte imbalances triggered by the steroids, at the level of the  kidney. Cramps and spasms tend to respond well to muscle relaxants, multivitamins (some are triggered by the procedure, but may have their origins in vitamin deficiencies), and "Gatorade", or any sports drinks that can replenish any electrolyte imbalances. (If you are a diabetic, ask your pharmacist to get you a sugar-free brand.) Warm showers or baths may also be helpful. Stretching exercises are highly recommended.  General Instructions:  Be alert for signs of possible infection: redness, swelling, heat, red streaks, elevated temperature, and/or fever. These typically appear 4 to 6 days after the procedure. Immediately notify your doctor if you experience unusual bleeding, difficulty breathing, or loss of bowel or bladder control. If you experience increased pain, do not increase your pain medicine intake, unless instructed by your pain physician.  Post-Procedure Care:  Be careful in moving about. Muscle spasms in the area of the injection may occur. Applying ice or heat to the area is often helpful. The incidence of spinal headaches after epidural injections ranges between 1.4% and 6%. If you develop a headache that does not seem to respond to conservative therapy, please let your physician know. This can be treated with an epidural blood patch.   Post-procedure numbness or redness is to be expected, however it should average 4 to 6 hours. If numbness and weakness of your extremities begins to develop 4 to 6 hours after your procedure, and is felt to be progressing and worsening, immediately contact your physician.  Diet:  If you experience nausea, do not eat until this sensation goes away. If you had a "Stellate Ganglion Block" for upper extremity "Reflex Sympathetic Dystrophy", do not eat or   drink until your hoarseness goes away. In any case, always start with liquids first and if you tolerate them well, then slowly progress to more solid foods.  Activity:  For the first 4 to 6 hours after the  procedure, use caution in moving about as you may experience numbness and/or weakness. Use caution in cooking, using household electrical appliances, and climbing steps. If you need to reach your Doctor call our office: [phone] (During business hours) or [phone] (After business hours).  Business Hours: Monday-Thursday 8:00 am - 4:00 PM    Fridays: Closed     In case of an emergency: In case of emergency, call 911 or go to the nearest emergency room and have the physician there call us.  Interpretation of Procedure Every nerve block has two components: a diagnostic component, and a treatment component. Unrealistic expectations are the most common causes of "perceived failure".  In a perfect world, a single nerve block should be able to completely and permanently eliminate the pain. Sadly, the world is not perfect.  Most pain management nerve blocks are performed using local anesthetics and steroids. Steroids are responsible for any long-term benefit that you may experience. Their purpose is to decrease any chronic swelling that may exist in the area. Steroids begin to work immediately after being injected. However, most patients will not experience any benefits until 5 to 10 days after the injection, when the swelling has come down to the point where they can tell a difference. Steroids will only help if there is swelling to be treated. As such, they can assist with the diagnosis. If effective, they suggest an inflammatory component to the pain, and if ineffective, they rule out inflammation as the main cause or component of the problem. If the problem is one of mechanical compression, you will get no benefit from those steroids.   In the case of local anesthetics, they have a crucial role in the diagnosis of your condition. Most will begin to work within15 to 20 minutes after injection. The duration will depend on the type used (short- vs. Long-acting). It is of outmost importance that  patients keep tract of their pain, after the procedure. To assist with this matter, a "Post-procedure Pain Diary" is provided. Make sure to complete it and to bring it back to your follow-up appointment.  As long as the patient keeps accurate, detailed records of their symptoms after every procedure, and returns to have those interpreted, every procedure will provide us with invaluable information. Even a block that does not provide the patient with any relief, will always provide us with information about the mechanism and the origin of the pain. The only time a nerve block can be considered a waste of time is when patients do not keep track of the results, or do not keep their post-procedure appointment.  Reporting the results back to your physician The Pain Score  Pain is a subjective complaint. It cannot be seen, touched, or measured. We depend entirely on the patient's report of the pain in order to assess your condition and treatment. To evaluate the pain, we use a pain scale, where "0" means "No Pain", and a "10" is "the worst possible pain that you can even imagine" (i.e. something like been eaten alive by a shark or being torn apart by a lion).   Use the Pain Scale provided. You will frequently be asked to rate your pain. Please be accurate, remember that medical decisions will be based on your   responses. Please do not rate your pain above a 10. Doing so is actually interpreted as "symptom magnification" (exaggeration). To put this into perspective, when you tell us that your pain is at a 10 (ten), what you are saying is that there is nothing we can do to make this pain any worse. (Carefully think about that.) ____________________________________________________________________________________________   Pain Management Discharge Instructions  General Discharge Instructions :  If you need to reach your doctor call: Monday-Friday 8:00 am - 4:00 pm at [phone] or toll free [phone].   After clinic hours [phone] to have operator reach doctor.  Bring all of your medication bottles to all your appointments in the pain clinic.  To cancel or reschedule your appointment with Pain Management please remember to call 24 hours in advance to avoid a fee.  Refer to the educational materials which you have been given on: General Risks, I had my Procedure. Discharge Instructions, Post Sedation.  Post Procedure Instructions:  Please notify your doctor immediately if you have any unusual bleeding, trouble breathing or pain that is not related to your normal pain.  Depending on the type of procedure that was done, some parts of your body may feel week and/or numb.  This usually clears up by tonight or the next day.  Walk with the use of an assistive device or accompanied by an adult for the 24 hours.  You may use ice on the affected area for the first 24 hours.  Put ice in a Ziploc bag and cover with a towel and place against area 15 minutes on 15 minutes off.  You may switch to heat after 24 hours.   ______________________________________________________________________________________________  Specialty Pain Scale  Introduction:  There are significant differences in how pain is reported. The word pain usually refers to physical pain, but it is also a common synonym of suffering. The medical community uses a scale from 0 (zero) to 10 (ten) to report pain level. Zero (0) is described as "no pain", while ten (10) is described as "the worse pain you can imagine". The problem with this scale is that physical pain is reported along with suffering. Suffering refers to mental pain, or more often yet it refers to any unpleasant feeling, emotion or aversion associated with the perception of harm or threat of harm. It is the psychological component of pain.  Pain Specialists prefer to separate the two components. The pain scale used by this practice is the Verbal Numerical Rating Scale  (VNRS-11). This scale is for the physical pain only. DO NOT INCLUDE how your pain psychologically affects you. This scale is for adults 21 years of age and older. It has 11 (eleven) levels. The 1st level is 0/10. This means: "right now, I have no pain". In the context of pain management, it also means: "right now, my physical pain is under control with the current therapy".  General Information:  The scale should reflect your current level of pain. Unless you are specifically asked for the level of your worst pain, or your average pain. If you are asked for one of these two, then it should be understood that it is over the past 24 hours.  Levels 1 (one) through 5 (five) are described below, and can be treated as an outpatient. Ambulatory pain management facilities such as ours are more than adequate to treat these levels. Levels 6 (six) through 10 (ten) are also described below, however, these must be treated as a hospitalized patient. While levels 6 (six)   and 7 (seven) may be evaluated at an urgent care facility, levels 8 (eight) through 10 (ten) constitute medical emergencies and as such, they belong in a hospital's emergency department. When having these levels (as described below), do not come to our office. Our facility is not equipped to manage these levels. Go directly to an urgent care facility or an emergency department to be evaluated.  Definitions:  Activities of Daily Living (ADL): Activities of daily living (ADL or ADLs) is a term used in healthcare to refer to people's daily self-care activities. Health professionals often use a person's ability or inability to perform ADLs as a measurement of their functional status, particularly in regard to people post injury, with disabilities and the elderly. There are two ADL levels: Basic and Instrumental. Basic Activities of Daily Living (BADL  or BADLs) consist of self-care tasks that include: Bathing and showering; personal hygiene and grooming  (including brushing/combing/styling hair); dressing; Toilet hygiene (getting to the toilet, cleaning oneself, and getting back up); eating and self-feeding (not including cooking or chewing and swallowing); functional mobility, often referred to as "transferring", as measured by the ability to walk, get in and out of bed, and get into and out of a chair; the broader definition (moving from one place to another while performing activities) is useful for people with different physical abilities who are still able to get around independently. Basic ADLs include the things many people do when they get up in the morning and get ready to go out of the house: get out of bed, go to the toilet, bathe, dress, groom, and eat. On the average, loss of function typically follows a particular order. Hygiene is the first to go, followed by loss of toilet use and locomotion. The last to go is the ability to eat. When there is only one remaining area in which the person is independent, there is a 62.9% chance that it is eating and only a 3.5% chance that it is hygiene. Instrumental Activities of Daily Living (IADL or IADLs) are not necessary for fundamental functioning, but they let an individual live independently in a community. IADL consist of tasks that include: cleaning and maintaining the house; home establishment and maintenance; care of others (including selecting and supervising caregivers); care of pets; child rearing; managing money; managing financials (investments, etc.); meal preparation and cleanup; shopping for groceries and necessities; moving within the community; safety procedures and emergency responses; health management and maintenance (taking prescribed medications); and using the telephone or other form of communication.  Instructions:  Most patients tend to report their pain as a combination of two factors, their physical pain and their psychosocial pain. This last one is also known as "suffering" and it  is reflection of how physical pain affects you socially and psychologically. From now on, report them separately.  From this point on, when asked to report your pain level, report only your physical pain. Use the following table for reference.  Pain Clinic Pain Levels (0-5/10)  Pain Level Score  Description  No Pain 0   Mild pain 1 Nagging, annoying, but does not interfere with basic activities of daily living (ADL). Patients are able to eat, bathe, get dressed, toileting (being able to get on and off the toilet and perform personal hygiene functions), transfer (move in and out of bed or a chair without assistance), and maintain continence (able to control bladder and bowel functions). Blood pressure and heart rate are unaffected. A normal heart rate for a healthy adult   ranges from 60 to 100 bpm (beats per minute).   Mild to moderate pain 2 Noticeable and distracting. Impossible to hide from other people. More frequent flare-ups. Still possible to adapt and function close to normal. It can be very annoying and may have occasional stronger flare-ups. With discipline, patients may get used to it and adapt.   Moderate pain 3 Interferes significantly with activities of daily living (ADL). It becomes difficult to feed, bathe, get dressed, get on and off the toilet or to perform personal hygiene functions. Difficult to get in and out of bed or a chair without assistance. Very distracting. With effort, it can be ignored when deeply involved in activities.   Moderately severe pain 4 Impossible to ignore for more than a few minutes. With effort, patients may still be able to manage work or participate in some social activities. Very difficult to concentrate. Signs of autonomic nervous system discharge are evident: dilated pupils (mydriasis); mild sweating (diaphoresis); sleep interference. Heart rate becomes elevated (>115 bpm). Diastolic blood pressure (lower number) rises above 100 mmHg. Patients find relief  in laying down and not moving.   Severe pain 5 Intense and extremely unpleasant. Associated with frowning face and frequent crying. Pain overwhelms the senses.  Ability to do any activity or maintain social relationships becomes significantly limited. Conversation becomes difficult. Pacing back and forth is common, as getting into a comfortable position is nearly impossible. Pain wakes you up from deep sleep. Physical signs will be obvious: pupillary dilation; increased sweating; goosebumps; brisk reflexes; cold, clammy hands and feet; nausea, vomiting or dry heaves; loss of appetite; significant sleep disturbance with inability to fall asleep or to remain asleep. When persistent, significant weight loss is observed due to the complete loss of appetite and sleep deprivation.  Blood pressure and heart rate becomes significantly elevated. Caution: If elevated blood pressure triggers a pounding headache associated with blurred vision, then the patient should immediately seek attention at an urgent or emergency care unit, as these may be signs of an impending stroke.    Emergency Department Pain Levels (6-10/10)  Emergency Room Pain 6 Severely limiting. Requires emergency care and should not be seen or managed at an outpatient pain management facility. Communication becomes difficult and requires great effort. Assistance to reach the emergency department may be required. Facial flushing and profuse sweating along with potentially dangerous increases in heart rate and blood pressure will be evident.   Distressing pain 7 Self-care is very difficult. Assistance is required to transport, or use restroom. Assistance to reach the emergency department will be required. Tasks requiring coordination, such as bathing and getting dressed become very difficult.   Disabling pain 8 Self-care is no longer possible. At this level, pain is disabling. The individual is unable to do even the most "basic" activities such as  walking, eating, bathing, dressing, transferring to a bed, or toileting. Fine motor skills are lost. It is difficult to think clearly.   Incapacitating pain 9 Pain becomes incapacitating. Thought processing is no longer possible. Difficult to remember your own name. Control of movement and coordination are lost.   The worst pain imaginable 10 At this level, most patients pass out from pain. When this level is reached, collapse of the autonomic nervous system occurs, leading to a sudden drop in blood pressure and heart rate. This in turn results in a temporary and dramatic drop in blood flow to the brain, leading to a loss of consciousness. Fainting is one of the body's self   defense mechanisms. Passing out puts the brain in a calmed state and causes it to shut down for a while, in order to begin the healing process.    Summary: 1. Refer to this scale when providing us with your pain level. 2. Be accurate and careful when reporting your pain level. This will help with your care. 3. Over-reporting your pain level will lead to loss of credibility. 4. Even a level of 1/10 means that there is pain and will be treated at our facility. 5. High, inaccurate reporting will be documented as "Symptom Exaggeration", leading to loss of credibility and suspicions of possible secondary gains such as obtaining more narcotics, or wanting to appear disabled, for fraudulent reasons. 6. Only pain levels of 5 or below will be seen at our facility. 7. Pain levels of 6 and above will be sent to the Emergency Department and the appointment cancelled. ______________________________________________________________________________________________   

## 2018-06-10 ENCOUNTER — Telehealth: Payer: Self-pay

## 2018-06-10 NOTE — Telephone Encounter (Signed)
Pt was called, no answer or answering service. 

## 2018-06-16 ENCOUNTER — Emergency Department: Payer: Medicaid Other

## 2018-06-16 ENCOUNTER — Ambulatory Visit
Admission: EM | Admit: 2018-06-16 | Discharge: 2018-06-16 | Disposition: A | Discharge status: Home / Self Care | Payer: Medicaid Other | Attending: Family Medicine | Admitting: Family Medicine

## 2018-06-16 ENCOUNTER — Encounter: Payer: Self-pay | Admitting: Emergency Medicine

## 2018-06-16 ENCOUNTER — Emergency Department
Admission: EM | Admit: 2018-06-16 | Discharge: 2018-06-16 | Disposition: A | Discharge status: Home / Self Care | Payer: Medicaid Other | Attending: Emergency Medicine | Admitting: Emergency Medicine

## 2018-06-16 ENCOUNTER — Other Ambulatory Visit: Payer: Self-pay

## 2018-06-16 DIAGNOSIS — R079 Chest pain, unspecified: Secondary | ICD-10-CM

## 2018-06-16 DIAGNOSIS — E7800 Pure hypercholesterolemia, unspecified: Secondary | ICD-10-CM

## 2018-06-16 DIAGNOSIS — I10 Essential (primary) hypertension: Secondary | ICD-10-CM

## 2018-06-16 DIAGNOSIS — Z96642 Presence of left artificial hip joint: Secondary | ICD-10-CM | POA: Diagnosis not present

## 2018-06-16 DIAGNOSIS — Z79899 Other long term (current) drug therapy: Secondary | ICD-10-CM | POA: Insufficient documentation

## 2018-06-16 DIAGNOSIS — F17228 Nicotine dependence, chewing tobacco, with other nicotine-induced disorders: Secondary | ICD-10-CM | POA: Insufficient documentation

## 2018-06-16 LAB — BASIC METABOLIC PANEL
Anion gap: 10 (ref 5–15)
BUN: 14 mg/dL (ref 8–23)
CO2: 30 mmol/L (ref 22–32)
Calcium: 9 mg/dL (ref 8.9–10.3)
Chloride: 96 mmol/L — ABNORMAL LOW (ref 98–111)
Creatinine, Ser: 0.82 mg/dL (ref 0.61–1.24)
GFR calc Af Amer: >60 mL/min (ref >60)
GFR calc non Af Amer: >60 mL/min (ref >60)
Glucose, Bld: 103 mg/dL — ABNORMAL HIGH (ref 70–99)
Potassium: 3.5 mmol/L (ref 3.5–5.1)
Sodium: 136 mmol/L (ref 135–145)

## 2018-06-16 LAB — CBC
HCT: 43.3 % (ref 39.0–52.0)
Hemoglobin: 14.2 g/dL (ref 13.0–17.0)
MCH: 28.8 pg (ref 26.0–34.0)
MCHC: 32.8 g/dL (ref 30.0–36.0)
MCV: 87.8 fL (ref 80.0–100.0)
NRBC: 0 % (ref 0.0–0.2)
Platelets: 353 10*3/uL (ref 150–400)
RBC: 4.93 MIL/uL (ref 4.22–5.81)
RDW: 15.2 % (ref 11.5–15.5)
WBC: 15.7 10*3/uL — ABNORMAL HIGH (ref 4.0–10.5)

## 2018-06-16 LAB — TROPONIN I
Troponin I: <0.03 ng/mL (ref <0.03)
Troponin I: <0.03 ng/mL (ref <0.03)

## 2018-06-16 MED ORDER — OXYCODONE HCL 5 MG PO TABS
10.0000 mg | ORAL_TABLET | Freq: Once | ORAL | Status: AC
  Administered 2018-06-16: 10 mg via ORAL
  Filled 2018-06-16: qty 2

## 2018-06-16 MED ORDER — ASPIRIN 81 MG PO CHEW
324.0000 mg | CHEWABLE_TABLET | Freq: Once | ORAL | Status: AC
Start: 2018-06-16 — End: 2018-06-16
  Administered 2018-06-16: 324 mg via ORAL

## 2018-06-16 MED ORDER — SODIUM CHLORIDE 0.9% FLUSH: 3.0000 mL | Freq: Once | INTRAVENOUS | Status: DC

## 2018-06-16 MED ORDER — NITROGLYCERIN 2 % TD OINT
0.5000 [in_us] | TOPICAL_OINTMENT | Freq: Once | TRANSDERMAL | Status: AC
  Administered 2018-06-16: 0.5 [in_us] via TOPICAL

## 2018-06-16 NOTE — ED Triage Notes (Signed)
Pt c/o chest pain. Started about 20 minutes ago. Worse after he ate. Denies shortness of breath. He states that he has tried maalox without relief. States he has never had chest pain this bad before. He has no history of heart problem.
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
## 2018-06-16 NOTE — Discharge Instructions (Signed)
Please seek medical attention for any high fevers, chest pain, shortness of breath, change in behavior, persistent vomiting, bloody stool or any other new or concerning symptoms.  
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
## 2018-06-16 NOTE — ED Triage Notes (Signed)
Pt to ER via EMS from [facility] with c/o left sided to midsternal chest pain that started 1 1/2 hours ago.  Pt was given NTG and ASA at urgent care with relief.

## 2018-06-17 ENCOUNTER — Ambulatory Visit
Admission: RE | Admit: 2018-06-17 | Discharge: 2018-06-17 | Disposition: A | Discharge status: Home / Self Care | Payer: Medicaid Other | Source: Ambulatory Visit | Attending: Unknown Physician Specialty | Admitting: Unknown Physician Specialty

## 2018-06-17 DIAGNOSIS — M5442 Lumbago with sciatica, left side: Secondary | ICD-10-CM | POA: Insufficient documentation

## 2018-06-17 DIAGNOSIS — G8929 Other chronic pain: Secondary | ICD-10-CM | POA: Insufficient documentation

## 2018-06-17 DIAGNOSIS — M5441 Lumbago with sciatica, right side: Secondary | ICD-10-CM | POA: Diagnosis present

## 2018-06-30 ENCOUNTER — Encounter: Payer: Self-pay | Admitting: Student in an Organized Health Care Education/Training Program

## 2018-06-30 ENCOUNTER — Other Ambulatory Visit: Payer: Self-pay

## 2018-06-30 ENCOUNTER — Ambulatory Visit
Payer: Medicaid Other | Attending: Student in an Organized Health Care Education/Training Program | Admitting: Student in an Organized Health Care Education/Training Program

## 2018-06-30 VITALS — BP 154/91 | HR 95 | Temp 98.4°F | Resp 18 | Ht 68.0 in | Wt 292.0 lb

## 2018-06-30 DIAGNOSIS — Z6841 Body Mass Index (BMI) 40.0 and over, adult: Secondary | ICD-10-CM | POA: Insufficient documentation

## 2018-06-30 DIAGNOSIS — G8929 Other chronic pain: Secondary | ICD-10-CM | POA: Diagnosis present

## 2018-06-30 DIAGNOSIS — M1711 Unilateral primary osteoarthritis, right knee: Secondary | ICD-10-CM | POA: Insufficient documentation

## 2018-06-30 DIAGNOSIS — G894 Chronic pain syndrome: Secondary | ICD-10-CM | POA: Diagnosis present

## 2018-06-30 DIAGNOSIS — M25561 Pain in right knee: Secondary | ICD-10-CM

## 2018-06-30 NOTE — Progress Notes (Signed)
Safety precautions to be maintained throughout the outpatient stay will include: orient to surroundings, keep bed in low position, maintain call bell within reach at all times, provide assistance with transfer out of bed and ambulation.  

## 2018-06-30 NOTE — Patient Instructions (Signed)
GENERAL RISKS AND COMPLICATIONS  What are the risk, side effects and possible complications? Generally speaking, most procedures are safe.  However, with any procedure there are risks, side effects, and the possibility of complications.  The risks and complications are dependent upon the sites that are lesioned, or the type of nerve block to be performed.  The closer the procedure is to the spine, the more serious the risks are.  Great care is taken when placing the radio frequency needles, block needles or lesioning probes, but sometimes complications can occur. 1. Infection: Any time there is an injection through the skin, there is a risk of infection.  This is why sterile conditions are used for these blocks.  There are four possible types of infection. 1. Localized skin infection. 2. Central Nervous System Infection-This can be in the form of Meningitis, which can be deadly. 3. Epidural Infections-This can be in the form of an epidural abscess, which can cause pressure inside of the spine, causing compression of the spinal cord with subsequent paralysis. This would require an emergency surgery to decompress, and there are no guarantees that the patient would recover from the paralysis. 4. Discitis-This is an infection of the intervertebral discs.  It occurs in about 1% of discography procedures.  It is difficult to treat and it may lead to surgery.        2. Pain: the needles have to go through skin and soft tissues, will cause soreness.       3. Damage to internal structures:  The nerves to be lesioned may be near blood vessels or    other nerves which can be potentially damaged.       4. Bleeding: Bleeding is more common if the patient is taking blood thinners such as  aspirin, Coumadin, Ticiid, Plavix, etc., or if he/she have some genetic predisposition  such as hemophilia. Bleeding into the spinal canal can cause compression of the spinal  cord with subsequent paralysis.  This would require an  emergency surgery to  decompress and there are no guarantees that the patient would recover from the  paralysis.       5. Pneumothorax:  Puncturing of a lung is a possibility, every time a needle is introduced in  the area of the chest or upper back.  Pneumothorax refers to free air around the  collapsed lung(s), inside of the thoracic cavity (chest cavity).  Another two possible  complications related to a similar event would include: Hemothorax and Chylothorax.   These are variations of the Pneumothorax, where instead of air around the collapsed  lung(s), you may have blood or chyle, respectively.       6. Spinal headaches: They may occur with any procedures in the area of the spine.       7. Persistent CSF (Cerebro-Spinal Fluid) leakage: This is a rare problem, but may occur  with prolonged intrathecal or epidural catheters either due to the formation of a fistulous  track or a dural tear.       8. Nerve damage: By working so close to the spinal cord, there is always a possibility of  nerve damage, which could be as serious as a permanent spinal cord injury with  paralysis.       9. Death:  Although rare, severe deadly allergic reactions known as "Anaphylactic  reaction" can occur to any of the medications used.      10. Worsening of the symptoms:  We can always make thing worse.    What are the chances of something like this happening? Chances of any of this occuring are extremely low.  By statistics, you have more of a chance of getting killed in a motor vehicle accident: while driving to the hospital than any of the above occurring .  Nevertheless, you should be aware that they are possibilities.  In general, it is similar to taking a shower.  Everybody knows that you can slip, hit your head and get killed.  Does that mean that you should not shower again?  Nevertheless always keep in mind that statistics do not mean anything if you happen to be on the wrong side of them.  Even if a procedure has a 1  (one) in a 1,000,000 (million) chance of going wrong, it you happen to be that one..Also, keep in mind that by statistics, you have more of a chance of having something go wrong when taking medications.  Who should not have this procedure? If you are on a blood thinning medication (e.g. Coumadin, Plavix, see list of "Blood Thinners"), or if you have an active infection going on, you should not have the procedure.  If you are taking any blood thinners, please inform your physician.  How should I prepare for this procedure?  Do not eat or drink anything at least six hours prior to the procedure.  Bring a driver with you .  It cannot be a taxi.  Come accompanied by an adult that can drive you back, and that is strong enough to help you if your legs get weak or numb from the local anesthetic.  Take all of your medicines the morning of the procedure with just enough water to swallow them.  If you have diabetes, make sure that you are scheduled to have your procedure done first thing in the morning, whenever possible.  If you have diabetes, take only half of your insulin dose and notify our nurse that you have done so as soon as you arrive at the clinic.  If you are diabetic, but only take blood sugar pills (oral hypoglycemic), then do not take them on the morning of your procedure.  You may take them after you have had the procedure.  Do not take aspirin or any aspirin-containing medications, at least eleven (11) days prior to the procedure.  They may prolong bleeding.  Wear loose fitting clothing that may be easy to take off and that you would not mind if it got stained with Betadine or blood.  Do not wear any jewelry or perfume  Remove any nail coloring.  It will interfere with some of our monitoring equipment.  NOTE: Remember that this is not meant to be interpreted as a complete list of all possible complications.  Unforeseen problems may occur.  BLOOD THINNERS The following drugs  contain aspirin or other products, which can cause increased bleeding during surgery and should not be taken for 2 weeks prior to and 1 week after surgery.  If you should need take something for relief of minor pain, you may take acetaminophen which is found in Tylenol,m Datril, Anacin-3 and Panadol. It is not blood thinner. The products listed below are.  Do not take any of the products listed below in addition to any listed on your instruction sheet.  A.P.C or A.P.C with Codeine Codeine Phosphate Capsules #3 Ibuprofen Ridaura  ABC compound Congesprin Imuran rimadil  Advil Cope Indocin Robaxisal  Alka-Seltzer Effervescent Pain Reliever and Antacid Coricidin or Coricidin-D  Indomethacin Rufen    Alka-Seltzer plus Cold Medicine Cosprin Ketoprofen S-A-C Tablets  Anacin Analgesic Tablets or Capsules Coumadin Korlgesic Salflex  Anacin Extra Strength Analgesic tablets or capsules CP-2 Tablets Lanoril Salicylate  Anaprox Cuprimine Capsules Levenox Salocol  Anexsia-D Dalteparin Magan Salsalate  Anodynos Darvon compound Magnesium Salicylate Sine-off  Ansaid Dasin Capsules Magsal Sodium Salicylate  Anturane Depen Capsules Marnal Soma  APF Arthritis pain formula Dewitt's Pills Measurin Stanback  Argesic Dia-Gesic Meclofenamic Sulfinpyrazone  Arthritis Bayer Timed Release Aspirin Diclofenac Meclomen Sulindac  Arthritis pain formula Anacin Dicumarol Medipren Supac  Analgesic (Safety coated) Arthralgen Diffunasal Mefanamic Suprofen  Arthritis Strength Bufferin Dihydrocodeine Mepro Compound Suprol  Arthropan liquid Dopirydamole Methcarbomol with Aspirin Synalgos  ASA tablets/Enseals Disalcid Micrainin Tagament  Ascriptin Doan's Midol Talwin  Ascriptin A/D Dolene Mobidin Tanderil  Ascriptin Extra Strength Dolobid Moblgesic Ticlid  Ascriptin with Codeine Doloprin or Doloprin with Codeine Momentum Tolectin  Asperbuf Duoprin Mono-gesic Trendar  Aspergum Duradyne Motrin or Motrin IB Triminicin  Aspirin  plain, buffered or enteric coated Durasal Myochrisine Trigesic  Aspirin Suppositories Easprin Nalfon Trillsate  Aspirin with Codeine Ecotrin Regular or Extra Strength Naprosyn Uracel  Atromid-S Efficin Naproxen Ursinus  Auranofin Capsules Elmiron Neocylate Vanquish  Axotal Emagrin Norgesic Verin  Azathioprine Empirin or Empirin with Codeine Normiflo Vitamin E  Azolid Emprazil Nuprin Voltaren  Bayer Aspirin plain, buffered or children's or timed BC Tablets or powders Encaprin Orgaran Warfarin Sodium  Buff-a-Comp Enoxaparin Orudis Zorpin  Buff-a-Comp with Codeine Equegesic Os-Cal-Gesic   Buffaprin Excedrin plain, buffered or Extra Strength Oxalid   Bufferin Arthritis Strength Feldene Oxphenbutazone   Bufferin plain or Extra Strength Feldene Capsules Oxycodone with Aspirin   Bufferin with Codeine Fenoprofen Fenoprofen Pabalate or Pabalate-SF   Buffets II Flogesic Panagesic   Buffinol plain or Extra Strength Florinal or Florinal with Codeine Panwarfarin   Buf-Tabs Flurbiprofen Penicillamine   Butalbital Compound Four-way cold tablets Penicillin   Butazolidin Fragmin Pepto-Bismol   Carbenicillin Geminisyn Percodan   Carna Arthritis Reliever Geopen Persantine   Carprofen Gold's salt Persistin   Chloramphenicol Goody's Phenylbutazone   Chloromycetin Haltrain Piroxlcam   Clmetidine heparin Plaquenil   Cllnoril Hyco-pap Ponstel   Clofibrate Hydroxy chloroquine Propoxyphen         Before stopping any of these medications, be sure to consult the physician who ordered them.  Some, such as Coumadin (Warfarin) are ordered to prevent or treat serious conditions such as "deep thrombosis", "pumonary embolisms", and other heart problems.  The amount of time that you may need off of the medication may also vary with the medication and the reason for which you were taking it.  If you are taking any of these medications, please make sure you notify your pain physician before you undergo any  procedures.   Moderate Conscious Sedation, Adult Sedation is the use of medicines to promote relaxation and relieve discomfort and anxiety. Moderate conscious sedation is a type of sedation. Under moderate conscious sedation, you are less alert than normal, but you are still able to respond to instructions, touch, or both. Moderate conscious sedation is used during short medical and dental procedures. It is milder than deep sedation, which is a type of sedation under which you cannot be easily woken up. It is also milder than general anesthesia, which is the use of medicines to make you unconscious. Moderate conscious sedation allows you to return to your regular activities sooner. Tell a health care provider about:  Any allergies you have.  All medicines you are taking, including vitamins, herbs, eye drops,  creams, and over-the-counter medicines.  Use of steroids (by mouth or creams).  Any problems you or family members have had with sedatives and anesthetic medicines.  Any blood disorders you have.  Any surgeries you have had.  Any medical conditions you have, such as sleep apnea.  Whether you are pregnant or may be pregnant.  Any use of cigarettes, alcohol, marijuana, or street drugs. What are the risks? Generally, this is a safe procedure. However, problems may occur, including:  Getting too much medicine (oversedation).  Nausea.  Allergic reaction to medicines.  Trouble breathing. If this happens, a breathing tube may be used to help with breathing. It will be removed when you are awake and breathing on your own.  Heart trouble.  Lung trouble. What happens before the procedure? Staying hydrated Follow instructions from your health care provider about hydration, which may include:  Up to 2 hours before the procedure - you may continue to drink clear liquids, such as water, clear fruit juice, black coffee, and plain tea. Eating and drinking restrictions Follow  instructions from your health care provider about eating and drinking, which may include:  8 hours before the procedure - stop eating heavy meals or foods such as meat, fried foods, or fatty foods.  6 hours before the procedure - stop eating light meals or foods, such as toast or cereal.  6 hours before the procedure - stop drinking milk or drinks that contain milk.  2 hours before the procedure - stop drinking clear liquids. Medicine Ask your health care provider about:  Changing or stopping your regular medicines. This is especially important if you are taking diabetes medicines or blood thinners.  Taking medicines such as aspirin and ibuprofen. These medicines can thin your blood. Do not take these medicines before your procedure if your health care provider instructs you not to.  Tests and exams  You will have a physical exam.  You may have blood tests done to show: ? How well your kidneys and liver are working. ? How well your blood can clot. General instructions  Plan to have someone take you home from the hospital or clinic.  If you will be going home right after the procedure, plan to have someone with you for 24 hours. What happens during the procedure?  An IV tube will be inserted into one of your veins.  Medicine to help you relax (sedative) will be given through the IV tube.  The medical or dental procedure will be performed. What happens after the procedure?  Your blood pressure, heart rate, breathing rate, and blood oxygen level will be monitored often until the medicines you were given have worn off.  Do not drive for 24 hours. This information is not intended to replace advice given to you by your health care provider. Make sure you discuss any questions you have with your health care provider. Document Released: 01/23/2001 Document Revised: 10/04/2015 Document Reviewed: 08/20/2015 Elsevier Interactive Patient Education  2019 [employer]   Radiofrequency Lesioning Radiofrequency lesioning is a procedure that is performed to relieve pain. The procedure is often used for back, neck, or arm pain. Radiofrequency lesioning involves the use of a machine that creates radio waves to make heat. During the procedure, the heat is applied to the nerve that carries the pain signal. The heat damages the nerve and interferes with the pain signal. Pain relief usually starts about 2 weeks after the procedure and lasts for 6 months to 1 year. Tell a  health care provider about:  Any allergies you have.  All medicines you are taking, including vitamins, herbs, eye drops, creams, and over-the-counter medicines.  Any problems you or family members have had with anesthetic medicines.  Any blood disorders you have.  Any surgeries you have had.  Any medical conditions you have.  Whether you are pregnant or may be pregnant. What are the risks? Generally, this is a safe procedure. However, problems may occur, including:  Pain or soreness at the injection site.  Infection at the injection site.  Damage to nerves or blood vessels. What happens before the procedure?  Ask your health care provider about: ? Changing or stopping your regular medicines. This is especially important if you are taking diabetes medicines or blood thinners. ? Taking medicines such as aspirin and ibuprofen. These medicines can thin your blood. Do not take these medicines before your procedure if your health care provider instructs you not to.  Follow instructions from your health care provider about eating or drinking restrictions.  Plan to have someone take you home after the procedure.  If you go home right after the procedure, plan to have someone with you for 24 hours. What happens during the procedure?  You will be given one or more of the following: ? A medicine to help you relax (sedative). ? A medicine to numb the area (local anesthetic).  You  will be awake during the procedure. You will need to be able to talk with the health care provider during the procedure.  With the help of a type of X-ray (fluoroscopy), the health care provider will insert a radiofrequency needle into the area to be treated.  Next, a wire that carries the radio waves (electrode) will be put through the radiofrequency needle. An electrical pulse will be sent through the electrode to verify the correct nerve. You will feel a tingling sensation, and you may have muscle twitching.  Then, the tissue that is around the needle tip will be heated by an electric current that is passed using the radiofrequency machine. This will numb the nerves.  A bandage (dressing) will be put on the insertion area after the procedure is done. The procedure may vary among health care providers and hospitals. What happens after the procedure?  Your blood pressure, heart rate, breathing rate, and blood oxygen level will be monitored often until the medicines you were given have worn off.  Return to your normal activities as directed by your health care provider. This information is not intended to replace advice given to you by your health care provider. Make sure you discuss any questions you have with your health care provider. Document Released: 12/27/2010 Document Revised: 10/06/2015 Document Reviewed: 06/07/2014 Elsevier Interactive Patient Education  2019 [employer].

## 2018-06-30 NOTE — Progress Notes (Signed)
Patient's Name: Joshua Petersen  MRN: [id number]  Referring Provider: [name], *  DOB: 1956/01/11  PCP: [name], MD  DOS: 06/30/2018  Note by: Gillis Santa, MD  Service setting: Ambulatory outpatient  Specialty: Interventional Pain Management  Location: [facility] (AMB) Pain Management Facility    Patient type: Established   Primary Reason(s) for Visit: Encounter for post-procedure evaluation of chronic illness with mild to moderate exacerbation CC: Back Pain (lower)  HPI  Mr. Dierks is a 63 y.o. year old, male patient, who comes today for a post-procedure evaluation. 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 His primarily concern today is the Back Pain (lower)  Pain Assessment: Location: Lower Back Radiating: left leg to upper leg, does not go to the knee Onset: More than a month ago Duration: Chronic pain Quality: Throbbing, Sharp Severity: 2 /10 (subjective, self-reported pain score)  Note: Reported level is compatible with observation.                         When using our objective Pain Scale, levels between 6 and 10/10 are said to belong in an emergency room, as it progressively worsens from a 6/10, described as severely limiting, requiring emergency care not usually available at an outpatient pain management facility. At a 6/10 level, communication becomes difficult and requires great effort. Assistance to reach the emergency department may be required. Facial flushing and profuse sweating along with potentially dangerous increases in heart rate and blood pressure will be evident. Effect on ADL:   Timing: Constant Modifying factors: rest, medications BP: (!) 154/91  HR: 95  Mr. Frazer comes in today for post-procedure evaluation.  Further details on both, my  assessment(s), as well as the proposed treatment plan, please see below.  Post-Procedure Assessment  06/09/2018 Procedure: Right genicular nerve block Pre-procedure pain score:  3/10 Post-procedure pain score: 2/10         Influential Factors: BMI: 44.40 kg/m Intra-procedural challenges: None observed.         Assessment challenges: None detected.              Reported side-effects: None.        Post-procedural adverse reactions or complications: None reported         Sedation: Please see nurses note. When no sedatives are used, the analgesic levels obtained are directly associated to the effectiveness of the local anesthetics. However, when sedation is provided, the level of analgesia obtained during the initial 1 hour following the intervention, is believed to be the result of a combination of factors. These factors may include, but are not limited to: 1. The effectiveness of the local anesthetics used. 2. The effects of the analgesic(s) and/or anxiolytic(s) used. 3. The degree of discomfort experienced by the patient at the time of the procedure. 4. The patients ability and reliability in recalling and recording the events. 5. The presence and influence of possible secondary gains and/or psychosocial factors. Reported result: Relief experienced during the 1st hour after the procedure: 90 % (Ultra-Short Term Relief)            Interpretative annotation: Clinically appropriate result. Analgesia during this period is likely to be Local Anesthetic and/or IV Sedative (Analgesic/Anxiolytic) related.  Effects of local anesthetic: The analgesic effects attained during this period are directly associated to the localized infiltration of local anesthetics and therefore cary significant diagnostic value as to the etiological location, or anatomical origin, of the pain. Expected duration of relief is directly dependent on the pharmacodynamics of the local anesthetic used. Long-acting (4-6 hours)  anesthetics used.  Reported result: Relief during the next 4 to 6 hour after the procedure: 90 % (Short-Term Relief)            Interpretative annotation: Clinically appropriate result. Analgesia during this period is likely to be Local Anesthetic-related.          Long-term benefit: Defined as the period of time past the expected duration of local anesthetics (1 hour for short-acting and 4-6 hours for long-acting). With the possible exception of prolonged sympathetic blockade from the local anesthetics, benefits during this period are typically attributed to, or associated with, other factors such as analgesic sensory neuropraxia, antiinflammatory effects, or beneficial biochemical changes provided by agents other than the local anesthetics.  Reported result: Extended relief following procedure: 90 % (Long-Term Relief)            Interpretative annotation: Clinically possible results. Good relief. No permanent benefit expected. Inflammation plays a part in the etiology to the pain.          Current benefits: Defined as reported results that persistent at this point in time.   Analgesia: 50-75 %            Function: Somewhat improved ROM: Somewhat improved Interpretative annotation: Ongoing benefit. No permanent benefit expected. Effective diagnostic intervention.          Interpretation: Results would suggest a successful diagnostic and therapeutic intervention.                  Plan:  Proceed with Radiofrequency Ablation for the purpose of attaining long-term benefits.                Laboratory Chemistry  Inflammation Markers (CRP: Acute Phase) (ESR: Chronic Phase) No results found for: CRP, ESRSEDRATE, LATICACIDVEN                       Rheumatology Markers No results found for: RF, ANA, LABURIC, URICUR, LYMEIGGIGMAB, LYMEABIGMQN, HLAB27                      Renal Function Markers Lab Results  Component Value Date   BUN 14 06/16/2018   CREATININE 0.82 06/16/2018   GFRAA >60  06/16/2018   GFRNONAA >60 06/16/2018                             Hepatic Function Markers Lab Results  Component Value Date   AST 93 (H) 07/04/2016   ALT 92 (H) 07/04/2016   ALBUMIN 4.1 07/04/2016   ALKPHOS 61 07/04/2016   LIPASE 86 (H) 07/04/2016                        Electrolytes Lab Results  Component Value Date   NA 136 06/16/2018   K 3.5 06/16/2018   CL 96 (L) 06/16/2018   CALCIUM 9.0 06/16/2018                        Neuropathy Markers No results found for: VITAMINB12, FOLATE, HGBA1C, HIV  CNS Tests No results found for: COLORCSF, APPEARCSF, RBCCOUNTCSF, WBCCSF, POLYSCSF, LYMPHSCSF, EOSCSF, PROTEINCSF, GLUCCSF, JCVIRUS, CSFOLI, IGGCSF                      Bone Pathology Markers No results found for: VD25OH, JS283TD1VOH, [id number], IR4854OE7, 25OHVITD1, 25OHVITD2, 25OHVITD3, TESTOFREE, TESTOSTERONE                       Coagulation Parameters Lab Results  Component Value Date   INR 0.92 03/05/2017   LABPROT 12.3 03/05/2017   PLT 353 06/16/2018                        Cardiovascular Markers Lab Results  Component Value Date   TROPONINI <0.03 06/16/2018   HGB 14.2 06/16/2018   HCT 43.3 06/16/2018                         CA Markers No results found for: CEA, CA125, LABCA2                      Endocrine Markers No results found for: TSH, FREET4, TESTOFREE, TESTOSTERONE, ESTRADIOL, ESTRADIOLPCT, ESTRADIOLFRE                      Note: Lab results reviewed.  Recent Diagnostic Imaging Results  MR LUMBAR SPINE WO CONTRAST CLINICAL DATA:  Chronic low back pain radiating into both hips and legs. History of a fall 6 months ago.  EXAM: MRI LUMBAR SPINE WITHOUT CONTRAST  TECHNIQUE: Multiplanar, multisequence MR imaging of the lumbar spine was performed. No intravenous contrast was administered.  COMPARISON:  None.  FINDINGS: Segmentation:  Standard.  Alignment:  Maintained.  Vertebrae: No fracture. No worrisome lesion. Mild  anterior endplate spurring in the upper lumbar spine noted.  Conus medullaris and cauda equina: Conus extends to the L1-2 level. Conus and cauda equina appear normal.  Paraspinal and other soft tissues: Negative.  Disc levels:  T12-L1: Negative.  L1-2: Negative.  L2-3: Minimal disc bulge. No stenosis.  L3-4: Shallow disc bulge and mild-to-moderate facet degenerative disease. No stenosis.  L4-5: Ligamentum flavum thickening and a minimal disc bulge. No stenosis.  L5-S1: Tiny central protrusion. No stenosis.  IMPRESSION: Mild lumbar degenerative disease as described above. The central canal and foramina are widely patent at all levels.  Electronically Signed   By: Inge Rise M.D.   On: 06/17/2018 12:18  Complexity Note: Imaging results reviewed. Results shared with Mr. Al, using Layman's terms.                         Meds   Current Outpatient Medications:  .  albuterol (PROVENTIL HFA;VENTOLIN HFA) 108 (90 BASE) MCG/ACT inhaler, Inhale 2 puffs into the lungs every 6 (six) hours as needed for wheezing or shortness of breath., Disp: , Rfl:  .  atorvastatin (LIPITOR) 40 MG tablet, Take 40 mg by mouth daily., Disp: , Rfl:  .  cyclobenzaprine (FLEXERIL) 10 MG tablet, Take 1 tablet (10 mg total) by mouth at bedtime., Disp: 30 tablet, Rfl: 0 .  diclofenac (VOLTAREN) 75 MG EC tablet, Take 75 mg by mouth 2 (two) times daily., Disp: , Rfl:  .  finasteride (PROSCAR) 5 MG tablet, Take 5 mg by mouth daily., Disp: , Rfl:  .  gabapentin (NEURONTIN) 600 MG tablet, Take 600-1,800 mg by mouth at bedtime.  Up to 3 tablets , Disp: , Rfl:  .  ketorolac (TORADOL) 10 MG tablet, Take 1 tablet (10 mg total) by mouth every 8 (eight) hours as needed., Disp: 15 tablet, Rfl: 0 .  lactulose (CHRONULAC) 10 GM/15ML solution, Take 30 g by mouth 3 (three) times daily., Disp: , Rfl:  .  lisinopril-hydrochlorothiazide (PRINZIDE,ZESTORETIC) 20-12.5 MG per tablet, Take 1 tablet by mouth daily., Disp: ,  Rfl:  .  oxycodone (ROXICODONE) 30 MG immediate release tablet, Take 30 mg by mouth See admin instructions. Take 30 mg by mouth up to 6 times daily as needed for pain, Disp: , Rfl:  .  QUEtiapine (SEROQUEL) 50 MG tablet, Take 1 tablet by mouth at bedtime. PT MAY INCREASE TO 2 OR 3 TABS (MAX), Disp: , Rfl:  .  tamsulosin (FLOMAX) 0.4 MG CAPS capsule, Take 0.8 mg by mouth daily. Take 30  minutes after the same meal each day, Disp: , Rfl:  .  pantoprazole (PROTONIX) 20 MG tablet, Take 20 mg by mouth daily., Disp: , Rfl:  .  traMADol (ULTRAM) 50 MG tablet, Take 1-2 tablets (50-100 mg total) by mouth every 6 (six) hours as needed for moderate pain. (Patient not taking: Reported on 06/16/2018), Disp: 40 tablet, Rfl: 0  ROS  Constitutional: Denies any fever or chills Gastrointestinal: No reported hemesis, hematochezia, vomiting, or acute GI distress Musculoskeletal: Denies any acute onset joint swelling, redness, loss of ROM, or weakness Neurological: No reported episodes of acute onset apraxia, aphasia, dysarthria, agnosia, amnesia, paralysis, loss of coordination, or loss of consciousness  Allergies  Mr. Goetting has No Known Allergies.  [place]  Drug: Mr. Skeens  reports no history of drug use. Alcohol:  reports current alcohol use of about 2.0 standard drinks of alcohol per week. Tobacco:  reports that he has quit smoking. His smoking use included cigarettes and pipe. His smokeless tobacco use includes snuff. Medical:  has a past medical history of Arthritis, Back pain, GERD (gastroesophageal reflux disease), H/O splenectomy, Hepatitis, High cholesterol, History of kidney stones, Hypertension, and Plantar fasciitis. Surgical: Mr. Timko  has a past surgical history that includes Hernia repair; Splenectomy; Joint replacement; and Total hip arthroplasty (Left, 03/12/2017). Family: family history includes Hypertension in his father.  Constitutional Exam  General appearance: Well nourished, well developed, and  well hydrated. In no apparent acute distress Vitals:   06/30/18 1349  BP: (!) 154/91  Pulse: 95  Resp: 18  Temp: 98.4 F (36.9 C)  TempSrc: Oral  SpO2: 96%  Weight: 292 lb (132.5 kg)  Height: _0  (1.727 m)   BMI Assessment: Estimated body mass index is 44.4 kg/m as calculated from the following:   Height as of this encounter: _1  (1.727 m).   Weight as of this encounter: 292 lb (132.5 kg).  BMI interpretation table: BMI level Category Range association with higher incidence of chronic pain  <18 kg/m2 Underweight   18.5-24.9 kg/m2 Ideal body weight   25-29.9 kg/m2 Overweight Increased incidence by 20%  30-34.9 kg/m2 Obese (Class I) Increased incidence by 68%  35-39.9 kg/m2 Severe obesity (Class II) Increased incidence by 136%  >40 kg/m2 Extreme obesity (Class III) Increased incidence by 254%   Patient's current BMI Ideal Body weight  Body mass index is 44.4 kg/m. Ideal body weight: 68.4 kg (150 lb 12.7 oz) Adjusted ideal body weight: 94 kg (207 lb 4.4 oz)   BMI Readings from Last 4 Encounters:  06/30/18 44.40 kg/m  06/16/18 44.40 kg/m  06/16/18 44.08  kg/m  06/09/18 44.09 kg/m   Wt Readings from Last 4 Encounters:  06/30/18 292 lb (132.5 kg)  06/16/18 292 lb (132.5 kg)  06/16/18 289 lb 14.5 oz (131.5 kg)  06/09/18 290 lb (131.5 kg)  Psych/Mental status: Alert, oriented x 3 (person, place, & time)       Eyes: PERLA Respiratory: No evidence of acute respiratory distress  Thoracic Spine Area Exam  Skin & Axial Inspection: No masses, redness, or swelling Alignment: Symmetrical Functional ROM: Unrestricted ROM Stability: No instability detected Muscle Tone/Strength: Functionally intact. No obvious neuro-muscular anomalies detected. Sensory (Neurological): Musculoskeletal pain pattern Muscle strength & Tone: No palpable anomalies  Lumbar Spine Area Exam  Skin & Axial Inspection: No masses, redness, or swelling Alignment: Symmetrical Functional ROM: Pain  restricted ROM affecting both sides Stability: No instability detected Muscle Tone/Strength: Functionally intact. No obvious neuro-muscular anomalies detected. Sensory (Neurological): Musculoskeletal pain pattern Palpation: No palpable anomalies       Provocative Tests: Hyperextension/rotation test: (+) due to pain. Lumbar quadrant test (Kemp's test): deferred today       Lateral bending test: deferred today       Patrick's Maneuver: deferred today                   FABER* test: deferred today                   S-I anterior distraction/compression test: deferred today         S-I lateral compression test: deferred today         S-I Thigh-thrust test: deferred today         S-I Gaenslen's test: deferred today         *(Flexion, ABduction and External Rotation)  Gait & Posture Assessment  Ambulation: Patient ambulates using a cane Gait: Limited. Using assistive device to ambulate Posture: Difficulty standing up straight, due to pain   Lower Extremity Exam    Side: Right lower extremity  Side: Left lower extremity  Stability: No instability observed          Stability: No instability observed          Skin & Extremity Inspection: Skin color, temperature, and hair growth are WNL. No peripheral edema or cyanosis. No masses, redness, swelling, asymmetry, or associated skin lesions. No contractures.  Skin & Extremity Inspection: Skin color, temperature, and hair growth are WNL. No peripheral edema or cyanosis. No masses, redness, swelling, asymmetry, or associated skin lesions. No contractures.  Functional ROM: Pain restricted ROM for knee joint          Functional ROM: Unrestricted ROM                  Muscle Tone/Strength: Functionally intact. No obvious neuro-muscular anomalies detected.  Muscle Tone/Strength: Functionally intact. No obvious neuro-muscular anomalies detected.  Sensory (Neurological): Musculoskeletal pain pattern        Sensory (Neurological): Unimpaired         DTR: Patellar: 0: absent Achilles: deferred today Plantar: deferred today  DTR: Patellar: deferred today Achilles: deferred today Plantar: deferred today  Palpation: No palpable anomalies  Palpation: No palpable anomalies   Assessment   Status Diagnosis  Responding Responding Controlled 1. Primary osteoarthritis of right knee   2. Chronic pain of right knee   3. Morbid obesity with BMI of 40.0-44.9, adult ([place])   4. Chronic pain syndrome      Patient follows up status post right knee genicular nerve block #1.  This was a successful diagnostic block.  Patient endorses greater than 75% pain relief that is still ongoing.  He endorses worsening pain with lateral rotation but states that his pain is significantly improved when walking in a straight line.  We discussed genicular radiofrequency ablation of the right knee.  Risks and benefits of this procedure were reviewed.  Patient would like to proceed.  Plan: -Right knee genicular nerve RFA.   Lab-work, procedure(s), and/or referral(s): Orders Placed This Encounter  Procedures  . Radiofrequency,Genicular   Time Note: Greater than 50% of the 25 minute(s) of face-to-face time spent with Mr. Gancarz, was spent in counseling/coordination of care regarding: Mr. Ess primary cause of pain, the treatment plan, treatment alternatives, the risks and possible complications of proposed treatment, going over the informed consent, the results, interpretation and significance of  his recent diagnostic interventional treatment(s), realistic expectations and the need to bring and keep the BMI below 30.  Provider-requested follow-up: Return for Procedure.  Future Appointments  Date Time Provider [place]  07/21/2018 11:45 AM Gillis Santa, MD [facility] None  08/28/2018 10:00 AM Deboraha Sprang, MD [facility]    Primary Care Physician: [name], MD Location: [facility] Note by: Gillis Santa, M.D Date: 06/30/2018; Time: 3:09 PM  Patient Instructions   GENERAL RISKS AND COMPLICATIONS  What are the risk, side effects and possible complications? Generally speaking, most procedures are safe.  However, with any procedure there are risks, side effects, and the possibility of complications.  The risks and complications are dependent upon the sites that are lesioned, or the type of nerve block to be performed.  The closer the procedure is to the spine, the more serious the risks are.  Great care is taken when placing the radio frequency needles, block needles or lesioning probes, but sometimes complications can occur. 1. Infection: Any time there is an injection through the skin, there is a risk of infection.  This is why sterile conditions are used for these blocks.  There are four possible types of infection. 1. Localized skin infection. 2. Central Nervous System Infection-This can be in the form of Meningitis, which can be deadly. 3. Epidural Infections-This can be in the form of an epidural abscess, which can cause pressure inside of the spine, causing compression of the spinal cord with subsequent paralysis. This would require an emergency surgery to decompress, and there are no guarantees that the patient would recover from the paralysis. 4. Discitis-This is an infection of the intervertebral discs.  It occurs in about 1% of discography procedures.  It is difficult to treat and it may lead to surgery.        2. Pain: the needles have to go through skin and soft tissues, will cause soreness.       3. Damage to internal structures:  The nerves to be lesioned may be near blood vessels or    other nerves which can be potentially damaged.       4. Bleeding: Bleeding is more common if the patient is taking blood thinners such as  aspirin, Coumadin, Ticiid, Plavix, etc., or if he/she have some genetic predisposition  such as hemophilia. Bleeding into the spinal canal can cause compression  of the spinal  cord with subsequent paralysis.  This would require an emergency surgery to  decompress and there are no guarantees that the patient would recover from the  paralysis.       5. Pneumothorax:  Puncturing  of a lung is a possibility, every time a needle is introduced in  the area of the chest or upper back.  Pneumothorax refers to free air around the  collapsed lung(s), inside of the thoracic cavity (chest cavity).  Another two possible  complications related to a similar event would include: Hemothorax and Chylothorax.   These are variations of the Pneumothorax, where instead of air around the collapsed  lung(s), you may have blood or chyle, respectively.       6. Spinal headaches: They may occur with any procedures in the area of the spine.       7. Persistent CSF (Cerebro-Spinal Fluid) leakage: This is a rare problem, but may occur  with prolonged intrathecal or epidural catheters either due to the formation of a fistulous  track or a dural tear.       8. Nerve damage: By working so close to the spinal cord, there is always a possibility of  nerve damage, which could be as serious as a permanent spinal cord injury with  paralysis.       9. Death:  Although rare, severe deadly allergic reactions known as "Anaphylactic  reaction" can occur to any of the medications used.      10. Worsening of the symptoms:  We can always make thing worse.  What are the chances of something like this happening? Chances of any of this occuring are extremely low.  By statistics, you have more of a chance of getting killed in a motor vehicle accident: while driving to the hospital than any of the above occurring .  Nevertheless, you should be aware that they are possibilities.  In general, it is similar to taking a shower.  Everybody knows that you can slip, hit your head and get killed.  Does that mean that you should not shower again?  Nevertheless always keep in mind that statistics do not mean anything if you  happen to be on the wrong side of them.  Even if a procedure has a 1 (one) in a 1,000,000 (million) chance of going wrong, it you happen to be that one..Also, keep in mind that by statistics, you have more of a chance of having something go wrong when taking medications.  Who should not have this procedure? If you are on a blood thinning medication (e.g. Coumadin, Plavix, see list of "Blood Thinners"), or if you have an active infection going on, you should not have the procedure.  If you are taking any blood thinners, please inform your physician.  How should I prepare for this procedure?  Do not eat or drink anything at least six hours prior to the procedure.  Bring a driver with you .  It cannot be a taxi.  Come accompanied by an adult that can drive you back, and that is strong enough to help you if your legs get weak or numb from the local anesthetic.  Take all of your medicines the morning of the procedure with just enough water to swallow them.  If you have diabetes, make sure that you are scheduled to have your procedure done first thing in the morning, whenever possible.  If you have diabetes, take only half of your insulin dose and notify our nurse that you have done so as soon as you arrive at the clinic.  If you are diabetic, but only take blood sugar pills (oral hypoglycemic), then do not take them on the morning of your procedure.  You may take  them after you have had the procedure.  Do not take aspirin or any aspirin-containing medications, at least eleven (11) days prior to the procedure.  They may prolong bleeding.  Wear loose fitting clothing that may be easy to take off and that you would not mind if it got stained with Betadine or blood.  Do not wear any jewelry or perfume  Remove any nail coloring.  It will interfere with some of our monitoring equipment.  NOTE: Remember that this is not meant to be interpreted as a complete list of all possible complications.   Unforeseen problems may occur.  BLOOD THINNERS The following drugs contain aspirin or other products, which can cause increased bleeding during surgery and should not be taken for 2 weeks prior to and 1 week after surgery.  If you should need take something for relief of minor pain, you may take acetaminophen which is found in Tylenol,m Datril, Anacin-3 and Panadol. It is not blood thinner. The products listed below are.  Do not take any of the products listed below in addition to any listed on your instruction sheet.  A.P.C or A.P.C with Codeine Codeine Phosphate Capsules #3 Ibuprofen Ridaura  ABC compound Congesprin Imuran rimadil  Advil Cope Indocin Robaxisal  Alka-Seltzer Effervescent Pain Reliever and Antacid Coricidin or Coricidin-D  Indomethacin Rufen  Alka-Seltzer plus Cold Medicine Cosprin Ketoprofen S-A-C Tablets  Anacin Analgesic Tablets or Capsules Coumadin Korlgesic Salflex  Anacin Extra Strength Analgesic tablets or capsules CP-2 Tablets Lanoril Salicylate  Anaprox Cuprimine Capsules Levenox Salocol  Anexsia-D Dalteparin Magan Salsalate  Anodynos Darvon compound Magnesium Salicylate Sine-off  Ansaid Dasin Capsules Magsal Sodium Salicylate  Anturane Depen Capsules Marnal Soma  APF Arthritis pain formula Dewitt's Pills Measurin Stanback  Argesic Dia-Gesic Meclofenamic Sulfinpyrazone  Arthritis Bayer Timed Release Aspirin Diclofenac Meclomen Sulindac  Arthritis pain formula Anacin Dicumarol Medipren Supac  Analgesic (Safety coated) Arthralgen Diffunasal Mefanamic Suprofen  Arthritis Strength Bufferin Dihydrocodeine Mepro Compound Suprol  Arthropan liquid Dopirydamole Methcarbomol with Aspirin Synalgos  ASA tablets/Enseals Disalcid Micrainin Tagament  Ascriptin Doan's Midol Talwin  Ascriptin A/D Dolene Mobidin Tanderil  Ascriptin Extra Strength Dolobid Moblgesic Ticlid  Ascriptin with Codeine Doloprin or Doloprin with Codeine Momentum Tolectin  Asperbuf Duoprin Mono-gesic  Trendar  Aspergum Duradyne Motrin or Motrin IB Triminicin  Aspirin plain, buffered or enteric coated Durasal Myochrisine Trigesic  Aspirin Suppositories Easprin Nalfon Trillsate  Aspirin with Codeine Ecotrin Regular or Extra Strength Naprosyn Uracel  Atromid-S Efficin Naproxen Ursinus  Auranofin Capsules Elmiron Neocylate Vanquish  Axotal Emagrin Norgesic Verin  Azathioprine Empirin or Empirin with Codeine Normiflo Vitamin E  Azolid Emprazil Nuprin Voltaren  Bayer Aspirin plain, buffered or children's or timed BC Tablets or powders Encaprin Orgaran Warfarin Sodium  Buff-a-Comp Enoxaparin Orudis Zorpin  Buff-a-Comp with Codeine Equegesic Os-Cal-Gesic   Buffaprin Excedrin plain, buffered or Extra Strength Oxalid   Bufferin Arthritis Strength Feldene Oxphenbutazone   Bufferin plain or Extra Strength Feldene Capsules Oxycodone with Aspirin   Bufferin with Codeine Fenoprofen Fenoprofen Pabalate or Pabalate-SF   Buffets II Flogesic Panagesic   Buffinol plain or Extra Strength Florinal or Florinal with Codeine Panwarfarin   Buf-Tabs Flurbiprofen Penicillamine   Butalbital Compound Four-way cold tablets Penicillin   Butazolidin Fragmin Pepto-Bismol   Carbenicillin Geminisyn Percodan   Carna Arthritis Reliever Geopen Persantine   Carprofen Gold's salt Persistin   Chloramphenicol Goody's Phenylbutazone   Chloromycetin Haltrain Piroxlcam   Clmetidine heparin Plaquenil   Cllnoril Hyco-pap Ponstel   Clofibrate Hydroxy chloroquine Propoxyphen  Before stopping any of these medications, be sure to consult the physician who ordered them.  Some, such as Coumadin (Warfarin) are ordered to prevent or treat serious conditions such as "deep thrombosis", "pumonary embolisms", and other heart problems.  The amount of time that you may need off of the medication may also vary with the medication and the reason for which you were taking it.  If you are taking any of these medications, please make sure  you notify your pain physician before you undergo any procedures.   Moderate Conscious Sedation, Adult Sedation is the use of medicines to promote relaxation and relieve discomfort and anxiety. Moderate conscious sedation is a type of sedation. Under moderate conscious sedation, you are less alert than normal, but you are still able to respond to instructions, touch, or both. Moderate conscious sedation is used during short medical and dental procedures. It is milder than deep sedation, which is a type of sedation under which you cannot be easily woken up. It is also milder than general anesthesia, which is the use of medicines to make you unconscious. Moderate conscious sedation allows you to return to your regular activities sooner. Tell a health care provider about:  Any allergies you have.  All medicines you are taking, including vitamins, herbs, eye drops, creams, and over-the-counter medicines.  Use of steroids (by mouth or creams).  Any problems you or family members have had with sedatives and anesthetic medicines.  Any blood disorders you have.  Any surgeries you have had.  Any medical conditions you have, such as sleep apnea.  Whether you are pregnant or may be pregnant.  Any use of cigarettes, alcohol, marijuana, or street drugs. What are the risks? Generally, this is a safe procedure. However, problems may occur, including:  Getting too much medicine (oversedation).  Nausea.  Allergic reaction to medicines.  Trouble breathing. If this happens, a breathing tube may be used to help with breathing. It will be removed when you are awake and breathing on your own.  Heart trouble.  Lung trouble. What happens before the procedure? Staying hydrated Follow instructions from your health care provider about hydration, which may include:  Up to 2 hours before the procedure - you may continue to drink clear liquids, such as water, clear fruit juice, black coffee, and plain  tea. Eating and drinking restrictions Follow instructions from your health care provider about eating and drinking, which may include:  8 hours before the procedure - stop eating heavy meals or foods such as meat, fried foods, or fatty foods.  6 hours before the procedure - stop eating light meals or foods, such as toast or cereal.  6 hours before the procedure - stop drinking milk or drinks that contain milk.  2 hours before the procedure - stop drinking clear liquids. Medicine Ask your health care provider about:  Changing or stopping your regular medicines. This is especially important if you are taking diabetes medicines or blood thinners.  Taking medicines such as aspirin and ibuprofen. These medicines can thin your blood. Do not take these medicines before your procedure if your health care provider instructs you not to.  Tests and exams  You will have a physical exam.  You may have blood tests done to show: ? How well your kidneys and liver are working. ? How well your blood can clot. General instructions  Plan to have someone take you home from the hospital or clinic.  If you will be going home right after  the procedure, plan to have someone with you for 24 hours. What happens during the procedure?  An IV tube will be inserted into one of your veins.  Medicine to help you relax (sedative) will be given through the IV tube.  The medical or dental procedure will be performed. What happens after the procedure?  Your blood pressure, heart rate, breathing rate, and blood oxygen level will be monitored often until the medicines you were given have worn off.  Do not drive for 24 hours. This information is not intended to replace advice given to you by your health care provider. Make sure you discuss any questions you have with your health care provider. Document Released: 01/23/2001 Document Revised: 10/04/2015 Document Reviewed: 08/20/2015 Elsevier Interactive Patient  Education  2019 [place].   Radiofrequency Lesioning Radiofrequency lesioning is a procedure that is performed to relieve pain. The procedure is often used for back, neck, or arm pain. Radiofrequency lesioning involves the use of a machine that creates radio waves to make heat. During the procedure, the heat is applied to the nerve that carries the pain signal. The heat damages the nerve and interferes with the pain signal. Pain relief usually starts about 2 weeks after the procedure and lasts for 6 months to 1 year. Tell a health care provider about:  Any allergies you have.  All medicines you are taking, including vitamins, herbs, eye drops, creams, and over-the-counter medicines.  Any problems you or family members have had with anesthetic medicines.  Any blood disorders you have.  Any surgeries you have had.  Any medical conditions you have.  Whether you are pregnant or may be pregnant. What are the risks? Generally, this is a safe procedure. However, problems may occur, including:  Pain or soreness at the injection site.  Infection at the injection site.  Damage to nerves or blood vessels. What happens before the procedure?  Ask your health care provider about: ? Changing or stopping your regular medicines. This is especially important if you are taking diabetes medicines or blood thinners. ? Taking medicines such as aspirin and ibuprofen. These medicines can thin your blood. Do not take these medicines before your procedure if your health care provider instructs you not to.  Follow instructions from your health care provider about eating or drinking restrictions.  Plan to have someone take you home after the procedure.  If you go home right after the procedure, plan to have someone with you for 24 hours. What happens during the procedure?  You will be given one or more of the following: ? A medicine to help you relax (sedative). ? A medicine to numb the area  (local anesthetic).  You will be awake during the procedure. You will need to be able to talk with the health care provider during the procedure.  With the help of a type of X-ray (fluoroscopy), the health care provider will insert a radiofrequency needle into the area to be treated.  Next, a wire that carries the radio waves (electrode) will be put through the radiofrequency needle. An electrical pulse will be sent through the electrode to verify the correct nerve. You will feel a tingling sensation, and you may have muscle twitching.  Then, the tissue that is around the needle tip will be heated by an electric current that is passed using the radiofrequency machine. This will numb the nerves.  A bandage (dressing) will be put on the insertion area after the procedure is done. The procedure may  vary among health care providers and hospitals. What happens after the procedure?  Your blood pressure, heart rate, breathing rate, and blood oxygen level will be monitored often until the medicines you were given have worn off.  Return to your normal activities as directed by your health care provider. This information is not intended to replace advice given to you by your health care provider. Make sure you discuss any questions you have with your health care provider. Document Released: 12/27/2010 Document Revised: 10/06/2015 Document Reviewed: 06/07/2014 Elsevier Interactive Patient Education  2019 [employer].

## 2018-07-21 ENCOUNTER — Other Ambulatory Visit: Payer: Self-pay

## 2018-07-21 ENCOUNTER — Ambulatory Visit: Payer: Medicaid Other | Admitting: Student in an Organized Health Care Education/Training Program

## 2018-07-21 ENCOUNTER — Encounter: Payer: Self-pay | Admitting: Student in an Organized Health Care Education/Training Program

## 2018-07-21 ENCOUNTER — Ambulatory Visit
Admission: RE | Admit: 2018-07-21 | Discharge: 2018-07-21 | Disposition: A | Discharge status: Home / Self Care | Payer: Medicaid Other | Source: Ambulatory Visit | Attending: Student in an Organized Health Care Education/Training Program | Admitting: Student in an Organized Health Care Education/Training Program

## 2018-07-21 VITALS — BP 133/93 | HR 94 | Temp 98.0°F | Resp 18 | Ht 68.0 in | Wt 288.0 lb

## 2018-07-21 DIAGNOSIS — M1711 Unilateral primary osteoarthritis, right knee: Secondary | ICD-10-CM | POA: Diagnosis not present

## 2018-07-21 DIAGNOSIS — M25561 Pain in right knee: Secondary | ICD-10-CM

## 2018-07-21 DIAGNOSIS — G8929 Other chronic pain: Secondary | ICD-10-CM | POA: Diagnosis present

## 2018-07-21 DIAGNOSIS — G894 Chronic pain syndrome: Secondary | ICD-10-CM

## 2018-07-21 MED ORDER — ROPIVACAINE HCL 2 MG/ML IJ SOLN
2.0000 mL | Freq: Once | INTRAMUSCULAR | Status: AC
  Administered 2018-07-21: 10 mL via EPIDURAL
  Filled 2018-07-21: qty 10

## 2018-07-21 MED ORDER — DEXAMETHASONE SODIUM PHOSPHATE 10 MG/ML IJ SOLN
10.0000 mg | Freq: Once | INTRAMUSCULAR | Status: AC
  Administered 2018-07-21: 10 mg
  Filled 2018-07-21: qty 1

## 2018-07-21 MED ORDER — LIDOCAINE HCL 2 % IJ SOLN
20.0000 mL | Freq: Once | INTRAMUSCULAR | Status: AC
  Administered 2018-07-21: 400 mg
  Filled 2018-07-21: qty 20

## 2018-07-21 NOTE — Patient Instructions (Signed)

## 2018-07-21 NOTE — Progress Notes (Signed)
Patient's Name: Joshua Petersen  MRN: [id number]  Referring Provider: Dione Housekeeper, *  DOB: 1955-09-03  PCP: Dione Housekeeper, MD  DOS: 07/21/2018  Note by: Edward Jolly, MD  Service setting: Ambulatory outpatient  Specialty: Interventional Pain Management  Patient type: Established  Location: [facility] (AMB) Pain Management Facility  Visit type: Interventional Procedure   Primary Reason for Visit: Interventional Pain Management Treatment. CC: Knee Pain (right)  Procedure:          Anesthesia, Analgesia, Anxiolysis:  Type: Therapeutic Superior-lateral, Superior-medial, and Inferior-medial, Genicular Nerve Radiofrequency Ablation.  #1  Region: Lateral, Anterior, and Medial aspects of the knee joint, above and below the knee joint proper. Level: Superior and inferior to the knee joint. Laterality: Right   Local Anesthetic: Lidocaine 2%  Position: Supine   Indications: 1. Primary osteoarthritis of right knee   2. Chronic pain of right knee   3. Chronic pain syndrome    Mr. Ilic has been dealing with the above chronic pain for longer than three months and has either failed to respond, was unable to tolerate, or simply did not get enough benefit from other more conservative therapies including, but not limited to: 1. Over-the-counter medications 2. Anti-inflammatory medications 3. Muscle relaxants 4. Membrane stabilizers 5. Opioids 6. Physical therapy and/or chiropractic manipulation 7. Modalities (Heat, ice, etc.) 8. Invasive techniques such as nerve blocks.  Mr. Zeiter has attained more than 50% relief of the pain from previous diagnostic GNB.   Pain Score: Pre-procedure: 2 /10 Post-procedure: 2 /10  Pre-op Assessment:  Mr. Cooks is a 63 y.o. (year old), male patient, seen today for interventional treatment. 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 Mr. Finelli has a current medication  list which includes the following prescription(s): albuterol, atorvastatin, cyclobenzaprine, diclofenac, finasteride, gabapentin, ketorolac, lactulose, lisinopril-hydrochlorothiazide, oxycodone, quetiapine, tamsulosin, and pantoprazole. His primarily concern today is the Knee Pain (right)  Initial Vital Signs:  Pulse/HCG Rate: 94ECG Heart Rate: 80 Temp: 98 F (36.7 C) Resp: 16 BP: 134/75 SpO2: 97 %  BMI: Estimated body mass index is 43.79 kg/m as calculated from the following:   Height as of this encounter:  (1.727 m).   Weight as of this encounter: 288 lb (130.6 kg).  Risk Assessment: Allergies: Reviewed. He has No Known Allergies.  Allergy Precautions: None required Coagulopathies: Reviewed. None identified.  Blood-thinner therapy: None at this time Active Infection(s): Reviewed. None identified. Mr. Deeley is afebrile  Site Confirmation: Mr. Pinkham was asked to confirm the procedure and laterality before marking the site Procedure checklist: Completed Consent: Before the procedure and under the influence of no sedative(s), amnesic(s), or anxiolytics, the patient was informed of the treatment options, risks and possible complications. To fulfill our ethical and legal obligations, as recommended by the American Medical Association's Code of Ethics, I have informed the patient of my clinical impression; the nature and purpose of the treatment or procedure; the risks, benefits, and possible complications of the intervention; the alternatives, including doing nothing; the risk(s) and benefit(s) of the alternative treatment(s) or procedure(s); and the risk(s) and benefit(s) of doing nothing. The patient was provided information about the general risks and possible complications associated with the procedure. These may include, but are not limited to: failure to achieve desired goals, infection, bleeding, organ or nerve damage, allergic reactions, paralysis, and death. In addition, the patient  was informed of those risks and complications associated to the  procedure, such as failure to decrease pain; infection; bleeding; organ or nerve damage with subsequent damage to sensory, motor, and/or autonomic systems, resulting in permanent pain, numbness, and/or weakness of one or several areas of the body; allergic reactions; (i.e.: anaphylactic reaction); and/or death. Furthermore, the patient was informed of those risks and complications associated with the medications. These include, but are not limited to: allergic reactions (i.e.: anaphylactic or anaphylactoid reaction(s)); adrenal axis suppression; blood sugar elevation that in diabetics may result in ketoacidosis or comma; water retention that in patients with history of congestive heart failure may result in shortness of breath, pulmonary edema, and decompensation with resultant heart failure; weight gain; swelling or edema; medication-induced neural toxicity; particulate matter embolism and blood vessel occlusion with resultant organ, and/or nervous system infarction; and/or aseptic necrosis of one or more joints. Finally, the patient was informed that Medicine is not an exact science; therefore, there is also the possibility of unforeseen or unpredictable risks and/or possible complications that may result in a catastrophic outcome. The patient indicated having understood very clearly. We have given the patient no guarantees and we have made no promises. Enough time was given to the patient to ask questions, all of which were answered to the patient's satisfaction. Mr. Baldelli has indicated that he wanted to continue with the procedure. Attestation: I, the ordering provider, attest that I have discussed with the patient the benefits, risks, side-effects, alternatives, likelihood of achieving goals, and potential problems during recovery for the procedure that I have provided informed consent. Date  Time: 07/21/2018  9:54 AM  Pre-Procedure  Preparation:  Monitoring: As per clinic protocol. Respiration, ETCO2, SpO2, BP, heart rate and rhythm monitor placed and checked for adequate function Safety Precautions: Patient was assessed for positional comfort and pressure points before starting the procedure. Time-out: I initiated and conducted the "Time-out" before starting the procedure, as per protocol. The patient was asked to participate by confirming the accuracy of the "Time Out" information. Verification of the correct person, site, and procedure were performed and confirmed by me, the nursing staff, and the patient. "Time-out" conducted as per Joint Commission's Universal Protocol (UP.01.01.01). Time: 1105  Description of Procedure:          Target Area: For Genicular Nerve block(s), the targets are: the superior-lateral genicular nerve, located in the lateral distal portion of the femoral shaft as it curves to form the lateral epicondyle, in the region of the distal femoral metaphysis; the superior-medial genicular nerve, located in the medial distal portion of the femoral shaft as it curves to form the medial epicondyle; and the inferior-medial genicular nerve, located in the medial, proximal portion of the tibial shaft, as it curves to form the medial epicondyle, in the region of the proximal tibial metaphysis. Approach: Anterior, ipsilateral approach. Area Prepped: Entire knee area, from mid-thigh to mid-shin, lateral, anterior, and medial aspects. Prepping solution: Hibiclens (4.0% Chlorhexidine gluconate solution) Safety Precautions: Aspiration looking for blood return was conducted prior to all injections. At no point did we inject any substances, as a needle was being advanced. No attempts were made at seeking any paresthesias. Safe injection practices and needle disposal techniques used. Medications properly checked for expiration dates. SDV (single dose vial) medications used. Description of the Procedure: Protocol guidelines  were followed. The patient was placed in position over the procedure table. The target area was identified and the area prepped in the usual manner. The skin and muscle were infiltrated with local anesthetic. Appropriate amount of time allowed  to pass for local anesthetics to take effect. Radiofrequency needles were introduced to the target area using fluoroscopic guidance. Using the NeuroTherm NT1100 Radiofrequency Generator, sensory stimulation using 50 Hz was used to locate & identify the nerve, making sure that the needle was positioned such that there was no sensory stimulation below 0.3 V or above 0.7 V. Stimulation using 2 Hz was used to evaluate the motor component. Care was taken not to lesion any nerves that demonstrated motor stimulation of the lower extremities at an output of less than 2.5 times that of the sensory threshold, or a maximum of 2.0 V. Once satisfactory placement of the needles was achieved, the numbing solution was slowly injected after negative aspiration. After waiting for at least 2 minutes, the ablation was performed at 80 degrees C for 60 seconds, using regular Radiofrequency settings. Once the procedure was completed, the needles were then removed and the area cleansed, making sure to leave some of the prepping solution back to take advantage of its long term bactericidal properties. Intra-operative Compliance: Compliant Vitals:   07/21/18 1120 07/21/18 1125 07/21/18 1130 07/21/18 1139  BP: (!) 141/81 140/85 131/88 (!) 133/93  Pulse:      Resp: 14 13 14 18   Temp:      TempSrc:      SpO2: 100% 99% 99% 97%  Weight:      Height:        Start Time: 1105 hrs. End Time: 1134 hrs. Materials & Medications:  Needle(s) Type: Teflon-coated, curved tip, Radiofrequency needle(s) Gauge: 22G Length: 10cm Medication(s): Please see orders for medications and dosing details. 4.5 cc solution consisting of 3.5 cc of 0.2% ropivacaine, 1 cc of Decadron 10 mg/cc.  1.5 cc injected at  each level above prior to lesioning.  Imaging Guidance (Non-Spinal):          Type of Imaging Technique: Fluoroscopy Guidance (Non-Spinal) Indication(s): Assistance in needle guidance and placement for procedures requiring needle placement in or near specific anatomical locations not easily accessible without such assistance. Exposure Time: Please see nurses notes. Contrast: Before injecting any contrast, we confirmed that the patient did not have an allergy to iodine, shellfish, or radiological contrast. Once satisfactory needle placement was completed at the desired level, radiological contrast was injected. Contrast injected under live fluoroscopy. No contrast complications. See chart for type and volume of contrast used. Fluoroscopic Guidance: I was personally present during the use of fluoroscopy. "Tunnel Vision Technique" used to obtain the best possible view of the target area. Parallax error corrected before commencing the procedure. "Direction-depth-direction" technique used to introduce the needle under continuous pulsed fluoroscopy. Once target was reached, antero-posterior, oblique, and lateral fluoroscopic projection used confirm needle placement in all planes. Images permanently stored in EMR. Interpretation: I personally interpreted the imaging intraoperatively. Adequate needle placement confirmed in multiple planes. Appropriate spread of contrast into desired area was observed. No evidence of afferent or efferent intravascular uptake. Permanent images saved into the patient's record.  Antibiotic Prophylaxis:   Anti-infectives (From admission, onward)   None     Indication(s): None identified  Post-operative Assessment:  Post-procedure Vital Signs:  Pulse/HCG Rate: 9486 Temp: 98 F (36.7 C) Resp: 18 BP: (!) 133/93 SpO2: 97 %  EBL: None  Complications: No immediate post-treatment complications observed by team, or reported by patient.  Note: The patient tolerated the  entire procedure well. A repeat set of vitals were taken after the procedure and the patient was kept under observation following institutional policy, for this  type of procedure. Post-procedural neurological assessment was performed, showing return to baseline, prior to discharge. The patient was provided with post-procedure discharge instructions, including a section on how to identify potential problems. Should any problems arise concerning this procedure, the patient was given instructions to immediately contact us, at any time, without hesitation. In any case, we plan to contact the patient by telephone for a follow-up status report regarding this interventional procedure.  Comments:  No additional relevant information.  Plan of Care  Orders:  Orders Placed This Encounter  Procedures  . DG C-Arm 1-60 Min-No Report    Intraoperative interpretation by procedural physician at [facility].    Standing Status:   Standing    Number of Occurrences:   1    Order Specific Question:   Reason for exam:    Answer:   Assistance in needle guidance and placement for procedures requiring needle placement in or near specific anatomical locations not easily accessible without such assistance.   Medications ordered for procedure: Meds ordered this encounter  Medications  . lidocaine (XYLOCAINE) 2 % (with pres) injection 400 mg  . dexamethasone (DECADRON) injection 10 mg  . ropivacaine (PF) 2 mg/mL (0.2%) (NAROPIN) injection 2 mL   Medications administered: We administered lidocaine, dexamethasone, and ropivacaine (PF) 2 mg/mL (0.2%).  See the medical record for exact dosing, route, and time of administration.  Disposition: Discharge home  Discharge Date & Time: 07/21/2018; 1150 hrs.   Follow-up plan:   Return in about 6 weeks (around 09/01/2018) for Post Procedure Evaluation.     Future Appointments  Date Time Provider Department Center  08/28/2018 10:00 AM Duke Salvia, MD [id number]  09/01/2018  2:00 PM Edward Jolly, MD [facility] None   Primary Care Physician: Dione Housekeeper, MD Location: [facility] Note by: Edward Jolly, MD Date: 07/21/2018; Time: 2:48 PM  Disclaimer:  Medicine is not an exact science. The only guarantee in medicine is that nothing is guaranteed. It is important to note that the decision to proceed with this intervention was based on the information collected from the patient. The Data and conclusions were drawn from the patient's questionnaire, the interview, and the physical examination. Because the information was provided in large part by the patient, it cannot be guaranteed that it has not been purposely or unconsciously manipulated. Every effort has been made to obtain as much relevant data as possible for this evaluation. It is important to note that the conclusions that lead to this procedure are derived in large part from the available data. Always take into account that the treatment will also be dependent on availability of resources and existing treatment guidelines, considered by other Pain Management Practitioners as being common knowledge and practice, at the time of the intervention. For Medico-Legal purposes, it is also important to point out that variation in procedural techniques and pharmacological choices are the acceptable norm. The indications, contraindications, technique, and results of the above procedure should only be interpreted and judged by a Board-Certified Interventional Pain Specialist with extensive familiarity and expertise in the same exact procedure and technique.

## 2018-07-21 NOTE — Progress Notes (Signed)
Safety precautions to be maintained throughout the outpatient stay will include: orient to surroundings, keep bed in low position, maintain call bell within reach at all times, provide assistance with transfer out of bed and ambulation.  

## 2018-07-22 ENCOUNTER — Telehealth: Payer: Self-pay

## 2018-07-22 NOTE — Telephone Encounter (Signed)
Post procedure phone call. Patient states he is doing well.  

## 2018-07-24 ENCOUNTER — Telehealth: Payer: Self-pay | Admitting: Student in an Organized Health Care Education/Training Program

## 2018-07-24 ENCOUNTER — Ambulatory Visit: Payer: Self-pay | Admitting: Pain Medicine

## 2018-07-24 NOTE — Telephone Encounter (Signed)
Patient having some increased pain in his right knee after the genicular RF on 07/21/2018. Denies redness or swelling a sight. Denies fever. Informed of what to expect after RF and that some increased pain is expected. If it does not improve or he starts to have any other symptoms he should not hesitate to call.

## 2018-07-24 NOTE — Telephone Encounter (Signed)
Would like to speak with a nurse. He is having increasing pain since he had procedure on Monday 3-9.  He wants to verify if this is normal and when he can expect some relief.

## 2018-08-07 ENCOUNTER — Telehealth: Payer: Self-pay | Admitting: Student in an Organized Health Care Education/Training Program

## 2018-08-07 NOTE — Telephone Encounter (Signed)
Dr. Erin Sons would like to speak to you about the RF patient had 3 weeks ago. Please call him on cell phone.  [phone]

## 2018-08-20 ENCOUNTER — Other Ambulatory Visit: Payer: Self-pay

## 2018-08-20 ENCOUNTER — Telehealth: Payer: Self-pay | Admitting: Cardiovascular Disease

## 2018-08-20 ENCOUNTER — Encounter

## 2018-08-20 ENCOUNTER — Other Ambulatory Visit: Payer: Self-pay | Admitting: *Deleted

## 2018-08-20 ENCOUNTER — Telehealth (INDEPENDENT_AMBULATORY_CARE_PROVIDER_SITE_OTHER): Payer: Medicaid Other | Admitting: Cardiovascular Disease

## 2018-08-20 DIAGNOSIS — I10 Essential (primary) hypertension: Secondary | ICD-10-CM

## 2018-08-20 DIAGNOSIS — R079 Chest pain, unspecified: Secondary | ICD-10-CM | POA: Diagnosis not present

## 2018-08-20 DIAGNOSIS — E785 Hyperlipidemia, unspecified: Secondary | ICD-10-CM | POA: Diagnosis not present

## 2018-08-20 NOTE — Patient Instructions (Addendum)
Medication Instructions:  No change in medications. If you need a refill on your cardiac medications before your next appointment, please call your pharmacy.   Lab work: None If you have labs (blood work) drawn today and your tests are completely normal, you will receive your results only by: Marland Kitchen MyChart Message (if you have MyChart) OR . A paper copy in the mail If you have any lab test that is abnormal or we need to change your treatment, we will call you to review the results.  Testing/Procedures: Schedule Lexiscan Myoview in the next few weeks  Follow-Up: As needed based on the results of stress test.   [facility] MYOVIEW  Your provider has ordered a Stress Test with nuclear imaging. The purpose of this test is to evaluate the blood supply to your heart muscle. This procedure is referred to as a "Non-Invasive Stress Test." This is because other than having an IV started in your vein, nothing is inserted or "invades" your body. Cardiac stress tests are done to find areas of poor blood flow to the heart by determining the extent of coronary artery disease (CAD). Some patients exercise on a treadmill, which naturally increases the blood flow to your heart, while others who are unable to walk on a treadmill due to physical limitations have a pharmacologic/chemical stress agent called Lexiscan . This medicine will mimic walking on a treadmill by temporarily increasing your coronary blood flow.   Please note: these test may take anywhere between 2-4 hours to complete  PLEASE REPORT TO [facility]ICAL [facility] ENTRANCE  THE VOLUNTEERS AT THE FIRST DESK WILL DIRECT YOU WHERE TO GO  Date of Procedure:_____________________________________  Arrival Time for Procedure:______________________________  Instructions regarding medication:   Nothing to hold   PLEASE NOTIFY THE OFFICE AT LEAST 24 HOURS IN ADVANCE IF YOU ARE UNABLE TO KEEP YOUR APPOINTMENT.  [phone] AND  PLEASE NOTIFY NUCLEAR MEDICINE AT  [facility] AT LEAST 24 HOURS IN ADVANCE IF YOU ARE UNABLE TO KEEP YOUR APPOINTMENT. [phone]  How to prepare for your Myoview test:  1. Do not eat or drink after midnight 2. No caffeine for 24 hours prior to test 3. No smoking 24 hours prior to test. 4. Your medication may be taken with water.  If your doctor stopped a medication because of this test, do not take that medication. 5. Ladies, please do not wear dresses.  Skirts or pants are appropriate. Please wear a short sleeve shirt. 6. No perfume, cologne or lotion. 7. Wear comfortable walking shoes. No heels!

## 2018-08-20 NOTE — Telephone Encounter (Signed)
I spoke with the patient- he was on Dr. Odessa Fleming schedule as a new patient on 4/16 for ER follow up from 06/2018 for atypical chest pain.  I offered to move his appt today to see Dr. Kirke Corin for an e-visit. The patient was a little upset we didn't see him sooner than this as he was in the ER in February and felt that we weren't that concerned about his condition. I apologized for the delay and current situation for the e-visit in light of the COVID pandemic, but advised we would like to have him speak with the doctor to ensure no further testing is currently needed.  The patient did agree to a phone visit today.         Virtual Visit Pre-Appointment Phone Call  Steps For Call:  1. Confirm consent - "In the setting of the current Covid19 crisis, you are scheduled for a (phone or video) visit with your provider on (date) at (time).  Just as we do with many in-office visits, in order for you to participate in this visit, we must obtain consent.  If you'd like, I can send this to your mychart (if signed up) or email for you to review.  Otherwise, I can obtain your verbal consent now.  All virtual visits are billed to your insurance company just like a normal visit would be.  By agreeing to a virtual visit, we'd like you to understand that the technology does not allow for your provider to perform an examination, and thus may limit your provider's ability to fully assess your condition.  Finally, though the technology is pretty good, we cannot assure that it will always work on either your or our end, and in the setting of a video visit, we may have to convert it to a phone-only visit.  In either situation, we cannot ensure that we have a secure connection.  Are you willing to proceed?"    TELEPHONE CALL NOTE  Joshua Petersen has been deemed a candidate for a follow-up tele-health visit to limit community exposure during the Covid-19 pandemic. I spoke with the patient via phone to ensure availability of  phone/video source, confirm preferred email & phone number, and discuss instructions and expectations.  I reminded Joshua Petersen to be prepared with any vital sign and/or heart rhythm information that could potentially be obtained via home monitoring, at the time of his visit. I reminded Joshua Petersen to expect a phone call at the time of his visit if his visit.  Did the patient verbally acknowledge consent to treatment? Verbal Consetnt Obtained  Sherri Rad, RN 08/20/2018 9:55 AM
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
## 2018-08-21 ENCOUNTER — Other Ambulatory Visit: Payer: Self-pay

## 2018-08-21 ENCOUNTER — Ambulatory Visit
Admission: RE | Admit: 2018-08-21 | Discharge: 2018-08-21 | Disposition: A | Discharge status: Home / Self Care | Payer: Medicaid Other | Source: Ambulatory Visit | Attending: Cardiovascular Disease | Admitting: Cardiovascular Disease

## 2018-08-21 DIAGNOSIS — R079 Chest pain, unspecified: Secondary | ICD-10-CM | POA: Insufficient documentation

## 2018-08-21 LAB — NM MYOCAR MULTI W/SPECT W/WALL MOTION / EF
Estimated workload: 1 METS
Exercise duration (min): 0 min
Exercise duration (sec): 0 s
LV dias vol: 107 mL (ref 62–150)
LV sys vol: 48 mL
MPHR: 157 {beats}/min
Peak HR: 90 {beats}/min
Percent HR: 57 %
Rest HR: 71 {beats}/min
SDS: 1
SRS: 4
SSS: 2
TID: 0.95

## 2018-08-21 MED ORDER — TECHNETIUM TC 99M TETROFOSMIN IV KIT
31.7300 | PACK | Freq: Once | INTRAVENOUS | Status: AC | PRN
  Administered 2018-08-21: 31.73 via INTRAVENOUS

## 2018-08-21 MED ORDER — REGADENOSON 0.4 MG/5ML IV SOLN
0.4000 mg | Freq: Once | INTRAVENOUS | Status: AC
  Administered 2018-08-21: 0.4 mg via INTRAVENOUS

## 2018-08-21 MED ORDER — TECHNETIUM TC 99M TETROFOSMIN IV KIT
10.7700 | PACK | Freq: Once | INTRAVENOUS | Status: AC | PRN
  Administered 2018-08-21: 10.77 via INTRAVENOUS

## 2018-08-26 ENCOUNTER — Telehealth: Payer: Self-pay | Admitting: Student in an Organized Health Care Education/Training Program

## 2018-08-26 NOTE — Telephone Encounter (Signed)
Patient needs to talk to Dr, Cherylann Ratel at his virtual visit next week. He has questions about the outcome of his RF and what to do from here since he has not had any relief and things seem to have gotten worse. He states he will keep virtual appointment.

## 2018-08-26 NOTE — Telephone Encounter (Signed)
I called pt to let him know that his appt with Dr Cherylann Ratel next week is going to be virtual and he stated that his pain is excruciating and has only gotten worse since his procedure and would like to know what can be done before his f/u visit.

## 2018-08-28 ENCOUNTER — Encounter

## 2018-08-28 ENCOUNTER — Ambulatory Visit: Payer: Self-pay | Admitting: Internal Medicine

## 2018-09-01 ENCOUNTER — Ambulatory Visit: Payer: Self-pay | Admitting: Student in an Organized Health Care Education/Training Program

## 2018-09-02 ENCOUNTER — Other Ambulatory Visit: Payer: Self-pay

## 2018-09-02 ENCOUNTER — Ambulatory Visit
Payer: Medicaid Other | Attending: Student in an Organized Health Care Education/Training Program | Admitting: Student in an Organized Health Care Education/Training Program

## 2018-09-02 DIAGNOSIS — G894 Chronic pain syndrome: Secondary | ICD-10-CM

## 2018-09-02 DIAGNOSIS — M25561 Pain in right knee: Secondary | ICD-10-CM | POA: Diagnosis not present

## 2018-09-02 DIAGNOSIS — M1711 Unilateral primary osteoarthritis, right knee: Secondary | ICD-10-CM

## 2018-09-02 DIAGNOSIS — M23303 Other meniscus derangements, unspecified medial meniscus, right knee: Secondary | ICD-10-CM

## 2018-09-02 DIAGNOSIS — G8929 Other chronic pain: Secondary | ICD-10-CM

## 2018-09-02 DIAGNOSIS — Z6841 Body Mass Index (BMI) 40.0 and over, adult: Secondary | ICD-10-CM

## 2018-09-02 NOTE — Progress Notes (Signed)
Pain Management Virtual Encounter Note - Virtual Visit via Telephone Telehealth (real-time audio visits between healthcare provider and patient).  Patient's Phone No. & Preferred Pharmacy:  [phone] (home); There is no such number on file (mobile).; (Preferred) [phone] No e-mail address on record  [facility], [place] - [address] [place] [address] Phone: [phone] Fax: [phone]  [facility] [address] [place] - [address] [place] [address] Phone: [phone] Fax: [phone]   Pre-screening note:  Our staff contacted Joshua Petersen and offered him an "in person", "face-to-face" appointment versus a telephone encounter. He indicated preferring the telephone encounter, at this time.  Reason for Virtual Visit: COVID-19*  Social distancing based on CDC and AMA recommendations.   I contacted Joshua Petersen on 09/02/2018 at 3:31 PM via telephone and clearly identified myself as Edward Jolly, MD. I verified that I was speaking with the correct person using two identifiers (Name and date of birth: 05-16-1955).  Advanced Informed Consent I sought verbal advanced consent from Joshua Petersen for virtual visit interactions. I informed Mr. Stachowski of possible security and privacy concerns, risks, and limitations associated with providing "not-in-person" medical evaluation and management services. I also informed Mr. Flora of the availability of "in-person" appointments. Finally, I informed him that there would be a charge for the virtual visit and that he could be  personally, fully or partially, financially responsible for it. [name] expressed understanding and agreed to proceed.   Historic Elements   [name] is a 63 y.o. year old, male patient evaluated today after his last encounter by our practice on 08/26/2018. Joshua Petersen  has a past medical history of Arthritis, Back pain, GERD (gastroesophageal reflux disease), H/O  splenectomy, Hepatitis, High cholesterol, History of kidney stones, Hypertension, and Plantar fasciitis. He also  has a past surgical history that includes Hernia repair; Splenectomy; Joint replacement; and Total hip arthroplasty (Left, 03/12/2017). Joshua Petersen has a current medication list which includes the following prescription(s): albuterol, atorvastatin, finasteride, gabapentin, lactulose, lisinopril-hydrochlorothiazide, NON FORMULARY, oxycodone, quetiapine, tamsulosin, diclofenac, ketorolac, and pantoprazole. He  reports that he has quit smoking. His smoking use included cigarettes and pipe. His smokeless tobacco use includes snuff. He reports current alcohol use of about 2.0 standard drinks of alcohol per week. He reports that he does not use drugs. Joshua Petersen has No Known Allergies.   HPI  I last saw him on 08/26/2018. He is being evaluated for a post-procedure assessment.  Post-Procedure Evaluation  Procedure: RIGHT genicular RFA Pre-procedure pain level:  2/10 Post-procedure: 2/10          Sedation: Please see nurses note.  Effectiveness during initial hour after procedure(Ultra-Short Term Relief): 100 %  Local anesthetic used: Long-acting (4-6 hours) Effectiveness: Defined as any analgesic benefit obtained secondary to the administration of local anesthetics. This carries significant diagnostic value as to the etiological location, or anatomical origin, of the pain. Duration of benefit is expected to coincide with the duration of the local anesthetic used.  Effectiveness during initial 4-6 hours after procedure(Short-Term Relief): 80 %  Long-term benefit: Defined as any relief past the pharmacologic duration of the local anesthetics.  Effectiveness past the initial 6 hours after procedure(Long-Term Relief): 30%  Current benefits: Defined as benefit that persist at this time.   Analgesia:  Back to baseline Function: Back to baseline ROM: Back to baseline   Review of recent tests  NM  Myocar Multi W/Spect W/Wall Motion / EF Pharmacological myocardial perfusion imaging study with no significant   Ischemia On attenuation corrected images , there is a very small defect of mild  intensity noted in the apical region on stress images, SPECT secondary to  attenuation artifact Non-attenuation corrected images with significant inferior wall  attenuation artifact consistent with diaphragmatic attenuation Normal wall motion, EF estimated at 59% No EKG changes concerning for ischemia at peak stress or in recovery. CT scan reviewed with at least mild coronary calcification, mild  descending aorta atherosclerosis Low risk scan  Signed, Dossie Arbour, MD, Ph.D [facility] Outpatient Visit on 08/21/2018  Component Date Value Ref Range Status  . Rest HR 08/21/2018 71  bpm Final  . Rest BP 08/21/2018 140/68  mmHg Final  . Exercise duration (sec) 08/21/2018 0  sec Final  . Percent HR 08/21/2018 57  % Final  . Exercise duration (min) 08/21/2018 0  min Final  . Estimated workload 08/21/2018 1.0  METS Final  . Peak HR 08/21/2018 90  bpm Final  . Peak BP 08/21/2018 151/75  mmHg Final  . MPHR 08/21/2018 157  bpm Final  . SSS 08/21/2018 2   Final  . SRS 08/21/2018 4   Final  . SDS 08/21/2018 1   Final  . TID 08/21/2018 0.95   Final  . LV sys vol 08/21/2018 48  mL Final  . LV dias vol 08/21/2018 107  62 - 150 mL Final   Assessment  The primary encounter diagnosis was Primary osteoarthritis of right knee. 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  Postprocedural telephone encounter status post right genicular nerve radiofrequency ablation on 07/21/2018.  Patient states that the procedure provided him with pain relief for approximately 2 to 3 weeks and now he has return of pain prior to pre-ablation levels.  I had  extensive discussion with the patient about interventional therapeutic options and I am fairly limited as the patient has already tried intra-articular knee steroid injections in the past without any benefit.  At this point, I am fairly limited on what I can offer the patient in terms of interventional therapies.  Reviewing Dr. Guillermina City note from 08/07/2018 states that the patient would likely need total knee arthroplasty on the right.  Encouraged the patient to follow back up with orthopedics to discuss right knee total arthroplasty.  In regards to medication management, patient sees Pricilla Holm at [facility] pain clinic.  He is to continue his care there.  He is not an opioid candidate at his clinic.  Medication management to continue at [facility].  Plan of Care  I am having Joshua Petersen maintain his albuterol, lisinopril-hydrochlorothiazide, oxycodone, pantoprazole, QUEtiapine, ketorolac, diclofenac, lactulose, atorvastatin, gabapentin, tamsulosin, finasteride, and NON FORMULARY.  I discussed the assessment and treatment plan with the patient. The patient was provided an opportunity to ask questions and all were answered. The patient agreed with the plan and demonstrated an understanding of the instructions.  Total duration of non-face-to-face encounter: 21 minutes.  Note by: Edward Jolly, MD Date: 09/02/2018; Time: 3:31 PM  Disclaimer:  * Given the special circumstances of the COVID-19 pandemic, the federal government has announced that the Office for Civil Rights (OCR) will exercise its enforcement discretion and will not impose penalties on physicians using telehealth in the event of  noncompliance with regulatory requirements under the [employer] and Accountability Act (HIPAA) in connection with the good faith provision of telehealth during the UGAYG-47 national public health emergency. (AMA)

## 2018-10-10 ENCOUNTER — Other Ambulatory Visit: Payer: Self-pay

## 2018-10-10 ENCOUNTER — Emergency Department
Admission: EM | Admit: 2018-10-10 | Discharge: 2018-10-10 | Disposition: A | Discharge status: Home / Self Care | Payer: Medicaid Other | Attending: Emergency Medicine | Admitting: Emergency Medicine

## 2018-10-10 ENCOUNTER — Encounter: Payer: Self-pay | Admitting: Emergency Medicine

## 2018-10-10 DIAGNOSIS — Z760 Encounter for issue of repeat prescription: Secondary | ICD-10-CM | POA: Diagnosis present

## 2018-10-10 DIAGNOSIS — Z87891 Personal history of nicotine dependence: Secondary | ICD-10-CM | POA: Diagnosis not present

## 2018-10-10 DIAGNOSIS — I10 Essential (primary) hypertension: Secondary | ICD-10-CM | POA: Diagnosis not present

## 2018-10-10 DIAGNOSIS — G8929 Other chronic pain: Secondary | ICD-10-CM | POA: Diagnosis not present

## 2018-10-10 MED ORDER — OXYCODONE HCL 5 MG PO TABS: 5.0000 mg | ORAL_TABLET | Freq: Three times a day (TID) | ORAL | 0 refills | Status: DC | PRN
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
## 2018-10-10 NOTE — ED Triage Notes (Signed)
Patient states that he is out of his Roxicodone. Patient states that his dr office called into [facility] pharmacy but his medicaid number was wrong. Patient states that he has been with out his medication today and would not be able to get it until Monday.

## 2018-12-04 ENCOUNTER — Inpatient Hospital Stay: Admission: RE | Admit: 2018-12-04 | Payer: Medicaid Other | Source: Ambulatory Visit

## 2018-12-08 ENCOUNTER — Encounter
Admission: RE | Admit: 2018-12-08 | Discharge: 2018-12-08 | Disposition: A | Discharge status: Home / Self Care | Payer: Medicaid Other | Source: Ambulatory Visit | Attending: Surgery | Admitting: Surgery

## 2018-12-08 ENCOUNTER — Other Ambulatory Visit: Payer: Self-pay

## 2018-12-08 DIAGNOSIS — Z01812 Encounter for preprocedural laboratory examination: Secondary | ICD-10-CM | POA: Diagnosis not present

## 2018-12-08 LAB — BASIC METABOLIC PANEL
Anion gap: 13 (ref 5–15)
BUN: 16 mg/dL (ref 8–23)
CO2: 29 mmol/L (ref 22–32)
Calcium: 9.4 mg/dL (ref 8.9–10.3)
Chloride: 93 mmol/L — ABNORMAL LOW (ref 98–111)
Creatinine, Ser: 0.88 mg/dL (ref 0.61–1.24)
GFR calc Af Amer: >60 mL/min (ref >60)
GFR calc non Af Amer: >60 mL/min (ref >60)
Glucose, Bld: 109 mg/dL — ABNORMAL HIGH (ref 70–99)
Potassium: 3.9 mmol/L (ref 3.5–5.1)
Sodium: 135 mmol/L (ref 135–145)

## 2018-12-08 LAB — URINALYSIS, ROUTINE W REFLEX MICROSCOPIC
Bilirubin Urine: NEGATIVE
Glucose, UA: NEGATIVE mg/dL
Hgb urine dipstick: NEGATIVE
Ketones, ur: NEGATIVE mg/dL
Leukocytes,Ua: NEGATIVE
Nitrite: NEGATIVE
Protein, ur: NEGATIVE mg/dL
Specific Gravity, Urine: 1.025 (ref 1.005–1.030)
pH: 6 (ref 5.0–8.0)

## 2018-12-08 LAB — TYPE AND SCREEN
ABO/RH(D): O POS
Antibody Screen: NEGATIVE

## 2018-12-08 LAB — PROTIME-INR
INR: 1 (ref 0.8–1.2)
Prothrombin Time: 13 seconds (ref 11.4–15.2)

## 2018-12-08 LAB — APTT: aPTT: 27 seconds (ref 24–36)

## 2018-12-08 LAB — CBC
HCT: 46.8 % (ref 39.0–52.0)
Hemoglobin: 15.1 g/dL (ref 13.0–17.0)
MCH: 29.7 pg (ref 26.0–34.0)
MCHC: 32.3 g/dL (ref 30.0–36.0)
MCV: 92.1 fL (ref 80.0–100.0)
Platelets: 311 10*3/uL (ref 150–400)
RBC: 5.08 MIL/uL (ref 4.22–5.81)
RDW: 16 % — ABNORMAL HIGH (ref 11.5–15.5)
WBC: 12.6 10*3/uL — ABNORMAL HIGH (ref 4.0–10.5)
nRBC: 0 % (ref 0.0–0.2)

## 2018-12-08 LAB — SURGICAL PCR SCREEN
MRSA, PCR: NEGATIVE
Staphylococcus aureus: NEGATIVE

## 2018-12-08 LAB — SEDIMENTATION RATE: Sed Rate: 10 mm/hr (ref 0–20)

## 2018-12-08 NOTE — Patient Instructions (Addendum)
Your procedure is scheduled on: Thursday 12/11/18 Report to [place]. To find out your arrival time please call [phone] between 1PM - 3PM on Wednesday 12/10/18 .  Remember: Instructions that are not followed completely may result in serious medical risk, up to and including death, or upon the discretion of your surgeon and anesthesiologist your surgery may need to be rescheduled.     _X__ 1. Do not eat food after midnight the night before your procedure.                 No gum chewing or hard candies. You may drink clear liquids up to 2 hours                 before you are scheduled to arrive for your surgery- DO not drink clear                 liquids within 2 hours of the start of your surgery.                 Clear Liquids include:  water, apple juice without pulp, clear carbohydrate                 drink such as Clearfast or Gatorade, Black Coffee or Tea (Do not add                 anything to coffee or tea). Diabetics water only  __X__2.  On the morning of surgery brush your teeth with toothpaste and water, you                 may rinse your mouth with mouthwash if you wish.  Do not swallow any              toothpaste of mouthwash.     _X__ 3.  No Alcohol for 24 hours before or after surgery.   _X__ 4.  Do Not Smoke or use e-cigarettes For 24 Hours Prior to Your Surgery.                 Do not use any chewable tobacco products for at least 6 hours prior to                 surgery.  ____  5.  Bring all medications with you on the day of surgery if instructed.   __X__  6.  Notify your doctor if there is any change in your medical condition      (cold, fever, infections).     Do not wear jewelry, make-up, hairpins, clips or nail polish. Do not wear lotions, powders, or perfumes.  Do not shave 48 hours prior to surgery. Men may shave face and neck. Do not bring valuables to the hospital.    [facility] is not responsible  for any belongings or valuables.  Contacts, dentures/partials or body piercings may not be worn into surgery. Bring a case for your contacts, glasses or hearing aids, a denture cup will be supplied. Leave your suitcase in the car. After surgery it may be brought to your room. For patients admitted to the hospital, discharge time is determined by your treatment team.   Patients discharged the day of surgery will not be allowed to drive home.   Please read over the following fact sheets that you were given:   MRSA Information  __X__ Take these medicines the morning of surgery with A SIP OF  WATER:    1. atorvastatin (LIPITOR  2. finasteride (PROSCAR  3. pantoprazole (PROTONIX  4. tamsulosin (FLOMAX  5. oxycodone (ROXICODONE if needed  6.  ____ Fleet Enema (as directed)   __X__ Use CHG Soap/SAGE wipes as directed  __X__ Use inhalers on the day of surgery  ____ Stop metformin/Janumet/Farxiga 2 days prior to surgery    ____ Take 1/2 of usual insulin dose the night before surgery. No insulin the morning          of surgery.   ____ Stop Blood Thinners Coumadin/Plavix/Xarelto/Pleta/Pradaxa/Eliquis/Effient/Aspirin  on  Or contact your Surgeon, Cardiologist or Medical Doctor regarding  ability to stop your blood thinners  __X__ Stop Anti-inflammatories 7 days before surgery such as Advil, Ibuprofen, Motrin,  BC or Goodies Powder, Naprosyn, Naproxen, Aleve, Aspirin    __X__ Stop all herbal supplements, fish oil or vitamin E until after surgery.    ____ Bring C-Pap to the hospital.

## 2018-12-09 ENCOUNTER — Other Ambulatory Visit
Admission: RE | Admit: 2018-12-09 | Discharge: 2018-12-09 | Disposition: A | Discharge status: Home / Self Care | Payer: Medicaid Other | Source: Ambulatory Visit | Attending: Surgery | Admitting: Surgery

## 2018-12-09 DIAGNOSIS — Z20828 Contact with and (suspected) exposure to other viral communicable diseases: Secondary | ICD-10-CM | POA: Insufficient documentation

## 2018-12-09 LAB — URINE CULTURE: Culture: NO GROWTH

## 2018-12-10 LAB — SARS CORONAVIRUS 2 (TAT 6-24 HRS): SARS Coronavirus 2: NEGATIVE

## 2018-12-11 ENCOUNTER — Inpatient Hospital Stay: Payer: Medicaid Other | Admitting: Anesthesiology

## 2018-12-11 ENCOUNTER — Inpatient Hospital Stay
Admission: RE | Admit: 2018-12-11 | Discharge: 2018-12-12 | DRG: 470 | Disposition: A | Discharge status: Home / Self Care | Payer: Medicaid Other | Attending: Surgery | Admitting: Surgery

## 2018-12-11 ENCOUNTER — Other Ambulatory Visit: Payer: Self-pay

## 2018-12-11 ENCOUNTER — Encounter: Payer: Self-pay | Admitting: *Deleted

## 2018-12-11 ENCOUNTER — Encounter
Admission: RE | Disposition: A | Discharge status: Home / Self Care | Payer: Self-pay | Source: Home / Self Care | Attending: Surgery

## 2018-12-11 ENCOUNTER — Inpatient Hospital Stay: Payer: Medicaid Other

## 2018-12-11 DIAGNOSIS — R079 Chest pain, unspecified: Secondary | ICD-10-CM | POA: Diagnosis not present

## 2018-12-11 DIAGNOSIS — I10 Essential (primary) hypertension: Secondary | ICD-10-CM | POA: Diagnosis not present

## 2018-12-11 DIAGNOSIS — M722 Plantar fascial fibromatosis: Secondary | ICD-10-CM | POA: Diagnosis present

## 2018-12-11 DIAGNOSIS — Z9081 Acquired absence of spleen: Secondary | ICD-10-CM | POA: Diagnosis not present

## 2018-12-11 DIAGNOSIS — Z96642 Presence of left artificial hip joint: Secondary | ICD-10-CM | POA: Diagnosis not present

## 2018-12-11 DIAGNOSIS — Z79899 Other long term (current) drug therapy: Secondary | ICD-10-CM

## 2018-12-11 DIAGNOSIS — Z6841 Body Mass Index (BMI) 40.0 and over, adult: Secondary | ICD-10-CM

## 2018-12-11 DIAGNOSIS — G8918 Other acute postprocedural pain: Secondary | ICD-10-CM | POA: Diagnosis not present

## 2018-12-11 DIAGNOSIS — K219 Gastro-esophageal reflux disease without esophagitis: Secondary | ICD-10-CM | POA: Diagnosis present

## 2018-12-11 DIAGNOSIS — Z7901 Long term (current) use of anticoagulants: Secondary | ICD-10-CM | POA: Diagnosis not present

## 2018-12-11 DIAGNOSIS — Z96651 Presence of right artificial knee joint: Secondary | ICD-10-CM | POA: Diagnosis not present

## 2018-12-11 DIAGNOSIS — F17290 Nicotine dependence, other tobacco product, uncomplicated: Secondary | ICD-10-CM | POA: Diagnosis not present

## 2018-12-11 DIAGNOSIS — M1711 Unilateral primary osteoarthritis, right knee: Principal | ICD-10-CM | POA: Diagnosis present

## 2018-12-11 DIAGNOSIS — M25561 Pain in right knee: Secondary | ICD-10-CM | POA: Diagnosis present

## 2018-12-11 DIAGNOSIS — K759 Inflammatory liver disease, unspecified: Secondary | ICD-10-CM | POA: Diagnosis present

## 2018-12-11 DIAGNOSIS — M549 Dorsalgia, unspecified: Secondary | ICD-10-CM | POA: Diagnosis present

## 2018-12-11 HISTORY — PX: TOTAL KNEE ARTHROPLASTY: SHX125

## 2018-12-11 SURGERY — ARTHROPLASTY, KNEE, TOTAL
Anesthesia: General | Laterality: Right

## 2018-12-11 MED ORDER — DEXTROSE 5 % IV SOLN
3.0000 g | Freq: Four times a day (QID) | INTRAVENOUS | Status: AC
  Administered 2018-12-11 – 2018-12-12 (×3): 3 g via INTRAVENOUS
  Filled 2018-12-11 (×3): qty 3

## 2018-12-11 MED ORDER — HYDROMORPHONE HCL 1 MG/ML IJ SOLN
INTRAMUSCULAR | Status: AC
  Filled 2018-12-11: qty 1

## 2018-12-11 MED ORDER — MIDAZOLAM HCL 2 MG/2ML IJ SOLN
1.0000 mg | Freq: Once | INTRAMUSCULAR | Status: AC
  Administered 2018-12-11: 10:00:00 1 mg via INTRAVENOUS

## 2018-12-11 MED ORDER — KETOROLAC TROMETHAMINE 30 MG/ML IJ SOLN
INTRAMUSCULAR | Status: AC
  Filled 2018-12-11: qty 1

## 2018-12-11 MED ORDER — BISACODYL 10 MG RE SUPP: 10.0000 mg | Freq: Every day | RECTAL | Status: DC | PRN

## 2018-12-11 MED ORDER — PANTOPRAZOLE SODIUM 40 MG PO TBEC
40.0000 mg | DELAYED_RELEASE_TABLET | Freq: Every day | ORAL | Status: DC
  Administered 2018-12-11 – 2018-12-12 (×2): 40 mg via ORAL
  Filled 2018-12-11 (×2): qty 1

## 2018-12-11 MED ORDER — TAMSULOSIN HCL 0.4 MG PO CAPS
0.8000 mg | ORAL_CAPSULE | Freq: Every day | ORAL | Status: DC
  Administered 2018-12-11: 0.8 mg via ORAL
  Filled 2018-12-11: qty 2

## 2018-12-11 MED ORDER — MIDAZOLAM HCL 2 MG/2ML IJ SOLN
INTRAMUSCULAR | Status: AC
  Filled 2018-12-11: qty 2

## 2018-12-11 MED ORDER — BUPIVACAINE-EPINEPHRINE (PF) 0.5% -1:200000 IJ SOLN
INTRAMUSCULAR | Status: AC
  Filled 2018-12-11: qty 30

## 2018-12-11 MED ORDER — TRANEXAMIC ACID 1000 MG/10ML IV SOLN
INTRAVENOUS | Status: AC
  Filled 2018-12-11: qty 10

## 2018-12-11 MED ORDER — PROMETHAZINE HCL 25 MG/ML IJ SOLN
6.2500 mg | INTRAMUSCULAR | Status: DC | PRN
  Administered 2018-12-11: 11:00:00 12.5 mg via INTRAVENOUS

## 2018-12-11 MED ORDER — OXYCODONE HCL 5 MG PO TABS
30.0000 mg | ORAL_TABLET | ORAL | Status: DC | PRN
  Administered 2018-12-11: 60 mg via ORAL
  Filled 2018-12-11 (×2): qty 6

## 2018-12-11 MED ORDER — KETOROLAC TROMETHAMINE 30 MG/ML IJ SOLN
INTRAMUSCULAR | Status: DC | PRN
  Administered 2018-12-11: 30 mg via INTRAVENOUS

## 2018-12-11 MED ORDER — ACETAMINOPHEN 160 MG/5ML PO SOLN
325.0000 mg | ORAL | Status: DC | PRN
  Filled 2018-12-11: qty 20.3

## 2018-12-11 MED ORDER — SUGAMMADEX SODIUM 500 MG/5ML IV SOLN
INTRAVENOUS | Status: DC | PRN
  Administered 2018-12-11: 200 mg via INTRAVENOUS

## 2018-12-11 MED ORDER — ACETAMINOPHEN 10 MG/ML IV SOLN
INTRAVENOUS | Status: DC | PRN
  Administered 2018-12-11: 1000 mg via INTRAVENOUS

## 2018-12-11 MED ORDER — METOCLOPRAMIDE HCL 5 MG/ML IJ SOLN: 5.0000 mg | Freq: Three times a day (TID) | INTRAMUSCULAR | Status: DC | PRN

## 2018-12-11 MED ORDER — TAMSULOSIN HCL 0.4 MG PO CAPS: 0.8000 mg | ORAL_CAPSULE | Freq: Every day | ORAL | Status: DC

## 2018-12-11 MED ORDER — HYDROMORPHONE HCL 1 MG/ML IJ SOLN
INTRAMUSCULAR | Status: DC | PRN
  Administered 2018-12-11: .6 mg via INTRAVENOUS
  Administered 2018-12-11: .8 mg via INTRAVENOUS
  Administered 2018-12-11: .6 mg via INTRAVENOUS

## 2018-12-11 MED ORDER — ALBUTEROL SULFATE (2.5 MG/3ML) 0.083% IN NEBU: 3.0000 mL | INHALATION_SOLUTION | Freq: Four times a day (QID) | RESPIRATORY_TRACT | Status: DC | PRN

## 2018-12-11 MED ORDER — TRAMADOL HCL 50 MG PO TABS: 50.0000 mg | ORAL_TABLET | Freq: Four times a day (QID) | ORAL | Status: DC | PRN

## 2018-12-11 MED ORDER — DEXMEDETOMIDINE HCL IN NACL 200 MCG/50ML IV SOLN
INTRAVENOUS | Status: DC | PRN
  Administered 2018-12-11: 16 ug via INTRAVENOUS

## 2018-12-11 MED ORDER — PROPOFOL 10 MG/ML IV BOLUS
INTRAVENOUS | Status: DC | PRN
  Administered 2018-12-11: 200 mg via INTRAVENOUS

## 2018-12-11 MED ORDER — MEPERIDINE HCL 50 MG/ML IJ SOLN: 6.2500 mg | INTRAMUSCULAR | Status: DC | PRN

## 2018-12-11 MED ORDER — ENOXAPARIN SODIUM 40 MG/0.4ML ~~LOC~~ SOLN
40.0000 mg | Freq: Two times a day (BID) | SUBCUTANEOUS | Status: DC
  Administered 2018-12-12: 09:00:00 40 mg via SUBCUTANEOUS
  Filled 2018-12-11: qty 0.4

## 2018-12-11 MED ORDER — PROMETHAZINE HCL 25 MG/ML IJ SOLN
INTRAMUSCULAR | Status: AC
  Filled 2018-12-11: qty 1

## 2018-12-11 MED ORDER — QUETIAPINE FUMARATE 25 MG PO TABS
100.0000 mg | ORAL_TABLET | Freq: Every day | ORAL | Status: DC
  Administered 2018-12-11: 150 mg via ORAL
  Filled 2018-12-11: qty 6

## 2018-12-11 MED ORDER — HYDROCODONE-ACETAMINOPHEN 7.5-325 MG PO TABS
1.0000 | ORAL_TABLET | Freq: Once | ORAL | Status: DC | PRN
  Filled 2018-12-11: qty 1

## 2018-12-11 MED ORDER — METOPROLOL TARTRATE 5 MG/5ML IV SOLN
INTRAVENOUS | Status: DC | PRN
  Administered 2018-12-11: 5 mg via INTRAVENOUS

## 2018-12-11 MED ORDER — ACETAMINOPHEN 325 MG PO TABS: 325.0000 mg | ORAL_TABLET | Freq: Four times a day (QID) | ORAL | Status: DC | PRN

## 2018-12-11 MED ORDER — MIDAZOLAM HCL 2 MG/2ML IJ SOLN
1.0000 mg | Freq: Once | INTRAMUSCULAR | Status: AC
  Administered 2018-12-11: 1 mg via INTRAVENOUS

## 2018-12-11 MED ORDER — KETAMINE HCL 50 MG/ML IJ SOLN
INTRAMUSCULAR | Status: DC | PRN
  Administered 2018-12-11: 10 mg via INTRAMUSCULAR
  Administered 2018-12-11: 15 mg via INTRAMUSCULAR
  Administered 2018-12-11: 50 mg via INTRAVENOUS

## 2018-12-11 MED ORDER — SUCCINYLCHOLINE CHLORIDE 20 MG/ML IJ SOLN
INTRAMUSCULAR | Status: DC | PRN
  Administered 2018-12-11: 140 mg via INTRAVENOUS

## 2018-12-11 MED ORDER — KETAMINE HCL 50 MG/ML IJ SOLN
INTRAMUSCULAR | Status: AC
  Filled 2018-12-11: qty 10

## 2018-12-11 MED ORDER — HYDROMORPHONE HCL 1 MG/ML IJ SOLN
0.2500 mg | INTRAMUSCULAR | Status: DC | PRN
  Administered 2018-12-11 (×4): 0.5 mg via INTRAVENOUS

## 2018-12-11 MED ORDER — METOCLOPRAMIDE HCL 10 MG PO TABS: 5.0000 mg | ORAL_TABLET | Freq: Three times a day (TID) | ORAL | Status: DC | PRN

## 2018-12-11 MED ORDER — SODIUM CHLORIDE 0.9 % IV SOLN
INTRAVENOUS | Status: DC | PRN
  Administered 2018-12-11: 09:00:00 60 mL

## 2018-12-11 MED ORDER — OXYCODONE HCL 5 MG PO TABS
30.0000 mg | ORAL_TABLET | ORAL | Status: DC | PRN
  Administered 2018-12-11: 30 mg via ORAL
  Administered 2018-12-12: 01:00:00 60 mg via ORAL
  Administered 2018-12-12 (×2): 30 mg via ORAL
  Filled 2018-12-11: qty 12
  Filled 2018-12-11 (×3): qty 6

## 2018-12-11 MED ORDER — ACETAMINOPHEN 325 MG PO TABS: 325.0000 mg | ORAL_TABLET | ORAL | Status: DC | PRN

## 2018-12-11 MED ORDER — OXYCODONE HCL 5 MG PO TABS: 30.0000 mg | ORAL_TABLET | ORAL | Status: DC | PRN

## 2018-12-11 MED ORDER — ONDANSETRON HCL 4 MG/2ML IJ SOLN
INTRAMUSCULAR | Status: DC | PRN
  Administered 2018-12-11: 4 mg via INTRAVENOUS

## 2018-12-11 MED ORDER — DEXAMETHASONE SODIUM PHOSPHATE 10 MG/ML IJ SOLN
INTRAMUSCULAR | Status: DC | PRN
  Administered 2018-12-11: 5 mg via INTRAVENOUS

## 2018-12-11 MED ORDER — ONDANSETRON HCL 4 MG PO TABS: 4.0000 mg | ORAL_TABLET | Freq: Four times a day (QID) | ORAL | Status: DC | PRN

## 2018-12-11 MED ORDER — LISINOPRIL 20 MG PO TABS
20.0000 mg | ORAL_TABLET | Freq: Every day | ORAL | Status: DC
  Administered 2018-12-11 – 2018-12-12 (×2): 20 mg via ORAL
  Filled 2018-12-11 (×2): qty 1

## 2018-12-11 MED ORDER — FENTANYL CITRATE (PF) 100 MCG/2ML IJ SOLN
INTRAMUSCULAR | Status: DC | PRN
  Administered 2018-12-11: 150 ug via INTRAVENOUS
  Administered 2018-12-11: 100 ug via INTRAVENOUS

## 2018-12-11 MED ORDER — BUPIVACAINE-EPINEPHRINE (PF) 0.5% -1:200000 IJ SOLN
INTRAMUSCULAR | Status: DC | PRN
  Administered 2018-12-11: 30 mL via PERINEURAL

## 2018-12-11 MED ORDER — BUPIVACAINE LIPOSOME 1.3 % IJ SUSP
INTRAMUSCULAR | Status: AC
  Filled 2018-12-11: qty 20

## 2018-12-11 MED ORDER — SODIUM CHLORIDE FLUSH 0.9 % IV SOLN
INTRAVENOUS | Status: AC
  Filled 2018-12-11: qty 10

## 2018-12-11 MED ORDER — ROCURONIUM BROMIDE 100 MG/10ML IV SOLN
INTRAVENOUS | Status: DC | PRN
  Administered 2018-12-11: 20 mg via INTRAVENOUS
  Administered 2018-12-11: 50 mg via INTRAVENOUS

## 2018-12-11 MED ORDER — MIDAZOLAM HCL 2 MG/2ML IJ SOLN
INTRAMUSCULAR | Status: DC | PRN
  Administered 2018-12-11: 2 mg via INTRAVENOUS

## 2018-12-11 MED ORDER — HYDROMORPHONE HCL 1 MG/ML IJ SOLN
INTRAMUSCULAR | Status: AC
  Administered 2018-12-11: 10:00:00 0.5 mg via INTRAVENOUS
  Filled 2018-12-11: qty 2

## 2018-12-11 MED ORDER — SODIUM CHLORIDE FLUSH 0.9 % IV SOLN
INTRAVENOUS | Status: AC
  Filled 2018-12-11: qty 40

## 2018-12-11 MED ORDER — FENTANYL CITRATE (PF) 250 MCG/5ML IJ SOLN
INTRAMUSCULAR | Status: AC
  Filled 2018-12-11: qty 5

## 2018-12-11 MED ORDER — ATORVASTATIN CALCIUM 20 MG PO TABS
40.0000 mg | ORAL_TABLET | Freq: Every day | ORAL | Status: DC
  Administered 2018-12-12: 40 mg via ORAL
  Filled 2018-12-11: qty 2

## 2018-12-11 MED ORDER — KETOROLAC TROMETHAMINE 30 MG/ML IJ SOLN: 30.0000 mg | Freq: Once | INTRAMUSCULAR | Status: DC

## 2018-12-11 MED ORDER — LACTATED RINGERS IV SOLN
INTRAVENOUS | Status: DC | PRN
  Administered 2018-12-11: 07:00:00 via INTRAVENOUS

## 2018-12-11 MED ORDER — DIPHENHYDRAMINE HCL 12.5 MG/5ML PO ELIX: 12.5000 mg | ORAL_SOLUTION | ORAL | Status: DC | PRN

## 2018-12-11 MED ORDER — HYDROMORPHONE HCL 1 MG/ML IJ SOLN: 1.0000 mg | INTRAMUSCULAR | Status: DC | PRN

## 2018-12-11 MED ORDER — PROPOFOL 10 MG/ML IV BOLUS
INTRAVENOUS | Status: AC
  Filled 2018-12-11: qty 20

## 2018-12-11 MED ORDER — MAGNESIUM HYDROXIDE 400 MG/5ML PO SUSP: 30.0000 mL | Freq: Every day | ORAL | Status: DC | PRN

## 2018-12-11 MED ORDER — ONDANSETRON HCL 4 MG/2ML IJ SOLN: 4.0000 mg | Freq: Four times a day (QID) | INTRAMUSCULAR | Status: DC | PRN

## 2018-12-11 MED ORDER — FINASTERIDE 5 MG PO TABS
5.0000 mg | ORAL_TABLET | Freq: Every day | ORAL | Status: DC
  Administered 2018-12-12: 5 mg via ORAL
  Filled 2018-12-11: qty 1

## 2018-12-11 MED ORDER — KETOROLAC TROMETHAMINE 15 MG/ML IJ SOLN
15.0000 mg | Freq: Four times a day (QID) | INTRAMUSCULAR | Status: AC
  Administered 2018-12-11 – 2018-12-12 (×4): 15 mg via INTRAVENOUS
  Filled 2018-12-11 (×4): qty 1

## 2018-12-11 MED ORDER — MEPERIDINE HCL 50 MG/ML IJ SOLN
INTRAMUSCULAR | Status: AC
  Administered 2018-12-11: 25 mg via INTRAVENOUS
  Filled 2018-12-11: qty 1

## 2018-12-11 MED ORDER — FLEET ENEMA 7-19 GM/118ML RE ENEM: 1.0000 | ENEMA | Freq: Once | RECTAL | Status: DC | PRN

## 2018-12-11 MED ORDER — HYDROMORPHONE HCL 1 MG/ML IJ SOLN
0.5000 mg | INTRAMUSCULAR | Status: DC | PRN
  Administered 2018-12-11 (×2): 1 mg via INTRAVENOUS
  Filled 2018-12-11 (×2): qty 1

## 2018-12-11 MED ORDER — SEVOFLURANE IN SOLN
RESPIRATORY_TRACT | Status: AC
  Filled 2018-12-11: qty 250

## 2018-12-11 MED ORDER — ACETAMINOPHEN 500 MG PO TABS
1000.0000 mg | ORAL_TABLET | Freq: Four times a day (QID) | ORAL | Status: AC
  Administered 2018-12-11 – 2018-12-12 (×2): 1000 mg via ORAL
  Filled 2018-12-11 (×3): qty 2

## 2018-12-11 MED ORDER — LISINOPRIL-HYDROCHLOROTHIAZIDE 20-12.5 MG PO TABS: 1.0000 | ORAL_TABLET | Freq: Every day | ORAL | Status: DC

## 2018-12-11 MED ORDER — ACETAMINOPHEN 10 MG/ML IV SOLN
INTRAVENOUS | Status: AC
  Filled 2018-12-11: qty 100

## 2018-12-11 MED ORDER — SODIUM CHLORIDE 0.9 % IV SOLN
INTRAVENOUS | Status: DC
  Administered 2018-12-11 (×2): via INTRAVENOUS

## 2018-12-11 MED ORDER — DEXTROSE 5 % IV SOLN
3.0000 g | Freq: Once | INTRAVENOUS | Status: AC
  Administered 2018-12-11: 3 g via INTRAVENOUS
  Filled 2018-12-11: qty 3

## 2018-12-11 MED ORDER — GABAPENTIN 600 MG PO TABS
1800.0000 mg | ORAL_TABLET | Freq: Every day | ORAL | Status: DC
  Administered 2018-12-11: 1800 mg via ORAL
  Filled 2018-12-11 (×2): qty 3

## 2018-12-11 MED ORDER — ONDANSETRON HCL 4 MG/2ML IJ SOLN
INTRAMUSCULAR | Status: AC
  Filled 2018-12-11: qty 2

## 2018-12-11 MED ORDER — LACTATED RINGERS IV SOLN
INTRAVENOUS | Status: DC
  Administered 2018-12-11: 06:00:00 via INTRAVENOUS

## 2018-12-11 MED ORDER — DOCUSATE SODIUM 100 MG PO CAPS
100.0000 mg | ORAL_CAPSULE | Freq: Two times a day (BID) | ORAL | Status: DC
  Administered 2018-12-11 – 2018-12-12 (×3): 100 mg via ORAL
  Filled 2018-12-11 (×3): qty 1

## 2018-12-11 MED ORDER — TRANEXAMIC ACID 1000 MG/10ML IV SOLN
INTRAVENOUS | Status: DC | PRN
  Administered 2018-12-11: 1000 mg via TOPICAL

## 2018-12-11 MED ORDER — MEPERIDINE HCL 50 MG/ML IJ SOLN
25.0000 mg | INTRAMUSCULAR | Status: AC | PRN
  Administered 2018-12-11 (×2): 25 mg via INTRAVENOUS

## 2018-12-11 MED ORDER — HYDROCHLOROTHIAZIDE 12.5 MG PO CAPS
12.5000 mg | ORAL_CAPSULE | Freq: Every day | ORAL | Status: DC
  Administered 2018-12-11 – 2018-12-12 (×2): 12.5 mg via ORAL
  Filled 2018-12-11 (×2): qty 1

## 2018-12-11 SURGICAL SUPPLY — 64 items
BANDAGE ELASTIC 6 LF NS (GAUZE/BANDAGES/DRESSINGS) ×3 IMPLANT
BIT DRILL QUICK REL 1/8 2PK SL (DRILL) IMPLANT
BLADE SAW SAG 25X90X1.19 (BLADE) ×3 IMPLANT
BLADE SURG SZ20 CARB STEEL (BLADE) ×3 IMPLANT
CANISTER SUCT 1200ML W/VALVE (MISCELLANEOUS) ×3 IMPLANT
CANISTER SUCT 3000ML PPV (MISCELLANEOUS) ×3 IMPLANT
CEMENT BONE R 1X40 (Cement) ×6 IMPLANT
CEMENT VACUUM MIXING SYSTEM (MISCELLANEOUS) ×3 IMPLANT
CHLORAPREP W/TINT 26 (MISCELLANEOUS) ×3 IMPLANT
COMP FEMORAL CRUC RIGHT 75MM (Joint) ×3 IMPLANT
COMPONENT FEMRL CRUC RT 75MM (Joint) IMPLANT
COOLER POLAR GLACIER W/PUMP (MISCELLANEOUS) ×3 IMPLANT
COVER MAYO STAND REUSABLE (DRAPES) ×3 IMPLANT
COVER WAND RF STERILE (DRAPES) ×3 IMPLANT
CUFF TOURN SGL QUICK 24 (TOURNIQUET CUFF)
CUFF TOURN SGL QUICK 30 (TOURNIQUET CUFF) ×2
CUFF TRNQT CYL 24X4X16.5-23 (TOURNIQUET CUFF) IMPLANT
CUFF TRNQT CYL 30X4X21-28X (TOURNIQUET CUFF) IMPLANT
DRAPE 3/4 80X56 (DRAPES) ×3 IMPLANT
DRAPE SPLIT 6X30 W/TAPE (DRAPES) ×3 IMPLANT
DRILL QUICK RELEASE 1/8 INCH (DRILL) ×2
DRSG OPSITE POSTOP 4X10 (GAUZE/BANDAGES/DRESSINGS) ×3 IMPLANT
DRSG OPSITE POSTOP 4X8 (GAUZE/BANDAGES/DRESSINGS) ×1 IMPLANT
ELECT CAUTERY BLADE 6.4 (BLADE) ×3 IMPLANT
ELECT REM PT RETURN 9FT ADLT (ELECTROSURGICAL) ×3
ELECTRODE REM PT RTRN 9FT ADLT (ELECTROSURGICAL) ×1 IMPLANT
GLOVE BIO SURGEON STRL SZ7.5 (GLOVE) ×12 IMPLANT
GLOVE BIO SURGEON STRL SZ8 (GLOVE) ×12 IMPLANT
GLOVE BIOGEL PI IND STRL 8 (GLOVE) ×1 IMPLANT
GLOVE BIOGEL PI INDICATOR 8 (GLOVE) ×2
GLOVE INDICATOR 8.0 STRL GRN (GLOVE) ×3 IMPLANT
GOWN STRL REUS W/ TWL LRG LVL3 (GOWN DISPOSABLE) ×1 IMPLANT
GOWN STRL REUS W/ TWL XL LVL3 (GOWN DISPOSABLE) ×1 IMPLANT
GOWN STRL REUS W/TWL LRG LVL3 (GOWN DISPOSABLE) ×2
GOWN STRL REUS W/TWL XL LVL3 (GOWN DISPOSABLE) ×2
HOLDER FOLEY CATH W/STRAP (MISCELLANEOUS) ×1 IMPLANT
HOOD PEEL AWAY FLYTE STAYCOOL (MISCELLANEOUS) ×9 IMPLANT
IMMBOLIZER KNEE 19 BLUE UNIV (SOFTGOODS) ×3 IMPLANT
INSERT TIB BEARING 79X10 (Insert) ×2 IMPLANT
KIT TURNOVER KIT A (KITS) ×3 IMPLANT
NDL SAFETY ECLIPSE 18X1.5 (NEEDLE) ×2 IMPLANT
NDL SPNL 20GX3.5 QUINCKE YW (NEEDLE) ×1 IMPLANT
NEEDLE HYPO 18GX1.5 SHARP (NEEDLE) ×4
NEEDLE SPNL 20GX3.5 QUINCKE YW (NEEDLE) ×3 IMPLANT
NS IRRIG 1000ML POUR BTL (IV SOLUTION) ×3 IMPLANT
PACK TOTAL KNEE (MISCELLANEOUS) ×3 IMPLANT
PAD WRAPON POLAR KNEE (MISCELLANEOUS) ×1 IMPLANT
PEG PATELLA SERIES A 37MMX10MM (Orthopedic Implant) ×2 IMPLANT
PLATE KNEE TIBIAL 79MM FIXED (Plate) ×2 IMPLANT
PULSAVAC PLUS IRRIG FAN TIP (DISPOSABLE) ×3
SOL .9 NS 3000ML IRR  AL (IV SOLUTION) ×2
SOL .9 NS 3000ML IRR UROMATIC (IV SOLUTION) ×1 IMPLANT
STAPLER SKIN PROX 35W (STAPLE) ×3 IMPLANT
SUCTION FRAZIER HANDLE 10FR (MISCELLANEOUS) ×2
SUCTION TUBE FRAZIER 10FR DISP (MISCELLANEOUS) ×1 IMPLANT
SUT VIC AB 0 CT1 36 (SUTURE) ×9 IMPLANT
SUT VIC AB 2-0 CT1 27 (SUTURE) ×6
SUT VIC AB 2-0 CT1 TAPERPNT 27 (SUTURE) ×3 IMPLANT
SYR 10ML LL (SYRINGE) ×3 IMPLANT
SYR 20ML LL LF (SYRINGE) ×3 IMPLANT
SYR 30ML LL (SYRINGE) ×7 IMPLANT
TIP FAN IRRIG PULSAVAC PLUS (DISPOSABLE) ×1 IMPLANT
TRAY FOLEY MTR SLVR 16FR STAT (SET/KITS/TRAYS/PACK) ×1 IMPLANT
WRAPON POLAR PAD KNEE (MISCELLANEOUS) ×3

## 2018-12-11 NOTE — Anesthesia Post-op Follow-up Note (Signed)
Anesthesia QCDR form completed.        

## 2018-12-11 NOTE — Op Note (Signed)
12/11/2018  9:56 AM  Patient:   Joshua Petersen  Pre-Op Diagnosis:   Degenerative joint disease, right knee.  Post-Op Diagnosis:   Same  Procedure:   Right TKA using all-cemented Bi[employer] Vanguard system with a 75 mm PCR femur, a 79 mm tibial tray with a 10 mm anterior stabilized e-poly insert, and a 37 x 10 mm all-poly 3-pegged domed patella.  Surgeon:   Maryagnes Amos, MD  Assistant:   Horris Latino, PA-C   Anesthesia:   GET  Findings:   As above  Complications:   None  EBL:   10 cc  Fluids:   1000 cc crystalloid  UOP:   None  TT:   98 minutes at 300 mmHg  Drains:   None  Closure:   Staples  Implants:   As above  Brief Clinical Note:   The patient is a 63 year old male with a long history of progressively worsening right knee pain. The patient's symptoms have progressed despite medications, activity modification, injections, etc. The patient's history and examination were consistent with advanced degenerative joint disease of the right knee confirmed by plain radiographs. The patient presents at this time for a right total knee arthroplasty.  Procedure:   The patient was brought into the operating room and lain in the supine position.  After adequate general endotracheal intubation and anesthesia were obtained, the right lower extremity was prepped with ChloraPrep solution and draped sterilely. Preoperative antibiotics were administered. A timeout was performed to verify the appropriate surgical site before the limb was exsanguinated with an Esmarch and the tourniquet inflated to 300 mmHg. A standard anterior approach to the knee was made through an approximately 7 inch incision. The incision was carried down through the subcutaneous tissues to expose superficial retinaculum. This was split the length of the incision and the medial flap elevated sufficiently to expose the medial retinaculum. The medial retinaculum was incised, leaving a 3-4 mm cuff of tissue on the patella. This  was extended distally along the medial border of the patellar tendon and proximally through the medial third of the quadriceps tendon. A subtotal fat pad excision was performed before the soft tissues were elevated off the anteromedial and anterolateral aspects of the proximal tibia to the level of the collateral ligaments. The anterior portions of the medial and lateral menisci were removed, as was the anterior cruciate ligament. With the knee flexed to 90, the external tibial guide was positioned and the appropriate proximal tibial cut made. This piece was taken to the back table where it was measured and found to be optimally replicated by a 79 mm component.  Attention was directed to the distal femur. The intramedullary canal was accessed through a 3/8" drill hole. The intramedullary guide was inserted and positioned in order to obtain a neutral flexion gap. The intercondylar block was positioned with care taken to avoid notching the anterior cortex of the femur. The appropriate cut was made. Next, the distal cutting block was placed at 6 of valgus alignment. Using the 9 mm slot, the distal cut was made. The distal femur was measured and found to be optimally replicated by the 75 mm component. The 75 mm 4-in-1 cutting block was positioned and first the posterior, then the posterior chamfer, the anterior chamfer, and finally the anterior cuts were made. At this point, the posterior portions medial and lateral menisci were removed. A trial reduction was performed using the appropriate femoral and tibial components with the 10 mm insert. This demonstrated  excellent stability to varus and valgus stressing both in flexion and extension while permitting full extension. Patella tracking was assessed and found to be excellent. Therefore, the tibial guide position was marked on the proximal tibia. The patella thickness was measured and found to be 25 mm. Therefore, the appropriate cut was made. The patellar surface  was measured and found to be optimally replicated by the 37 mm component. The three peg holes were drilled in place before the trial button was inserted. Patella tracking was assessed and found to be excellent, passing the "no thumb test". The lug holes were drilled into the distal femur before the trial component was removed, leaving only the tibial tray. The keel was then created using the appropriate tower, reamer, and punch.  The bony surfaces were prepared for cementing by irrigating them thoroughly with bacitracin saline solution via the jet lavage system. A bone plug was fashioned from some of the bone that had been removed previously and used to plug the distal femoral canal. In addition, 20 cc of Exparel diluted out to 60 cc with normal saline and 30 cc of 0.5% Sensorcaine were injected into the postero-medial and postero-lateral aspects of the knee, the medial and lateral gutter regions, and the peri-incisional tissues to help with postoperative analgesia. Meanwhile, the cement was being mixed on the back table. When it was ready, the tibial tray was cemented in first. The excess cement was removed using [employer] fast streamer. Next, the femoral component was impacted into place. Again, the excess cement was removed using [employer] fast streamer. The 10 mm trial insert was positioned and the knee brought into extension while the cement hardened. Finally, the patella was cemented into place and secured using the patellar clamp. Again, the excess cement was removed using [employer] fast streamer. Once the cement had hardened, the knee was placed through a range of motion with the findings as described above. Therefore, the trial insert was removed and, after verifying that no cement had been retained posteriorly, the permanent 10 mm anterior stabilized E-polyethylene insert was positioned and secured using the appropriate key locking mechanism. Again the knee was placed through a range of motion with the findings as described  above.  The wound was copiously irrigated with bacitracin saline solution using the jet lavage system before the quadriceps tendon and retinacular layer were reapproximated using #0 Vicryl interrupted sutures. The superficial retinacular layer also was closed using a running #0 Vicryl suture. A total of 10 cc of transexemic acid (TXA) was injected intra-articularly before the subcutaneous tissues were closed in several layers using 2-0 Vicryl interrupted sutures. The skin was closed using staples. A sterile honeycomb dressing was applied to the skin before the leg was wrapped with an Ace wrap to accommodate the Polar Care device. The patient was then awakened, extubated, and returned to the recovery room in satisfactory condition after tolerating the procedure well.
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
## 2018-12-11 NOTE — Anesthesia Procedure Notes (Signed)
Procedure Name: Intubation Date/Time: 12/11/2018 7:26 AM Performed by: Justus Memory, CRNA Pre-anesthesia Checklist: Patient identified, Patient being monitored, Timeout performed, Emergency Drugs available and Suction available Patient Re-evaluated:Patient Re-evaluated prior to induction Oxygen Delivery Method: Circle system utilized Preoxygenation: Pre-oxygenation with 100% oxygen Induction Type: IV induction Ventilation: Mask ventilation without difficulty Laryngoscope Size: McGraph and 4 Grade View: Grade I Tube type: Oral Tube size: 7.0 mm Number of attempts: 1 Airway Equipment and Method: Stylet and Video-laryngoscopy Placement Confirmation: ETT inserted through vocal cords under direct vision,  positive ETCO2 and breath sounds checked- equal and bilateral Secured at: 21 cm Tube secured with: Tape Dental Injury: Teeth and Oropharynx as per pre-operative assessment  Difficulty Due To: Difficulty was anticipated and Difficult Airway- due to large tongue Future Recommendations: Recommend- induction with short-acting agent, and alternative techniques readily available

## 2018-12-11 NOTE — TOC Initial Note (Signed)
Transition of Care [facility]) - Initial/Assessment Note    Patient Details  Name: Joshua Petersen MRN: [id number] Date of Birth: [birth year]  [facility] CM/SW Contact:    Dianca Owensby, Lenice Llamas Phone Number: [phone]  12/11/2018, 4:50 PM  Clinical Narrative: Clinical Social Worker (CSW) met with patient alone at bedside to discuss D/C plan. Patient was alert and oriented X4 and was sitting up in the chair at bedside. Patient reported that he lives in [place] alone. Patient reported that his plan is to return home from [facility] and he will have his neighbors and son Merrily Pew checking in on him. Patient reported that he does have active medicaid and should get medicare next year when he turns 69. CSW provided patient with home health list from medicare.gov and made him aware that his surgeon prefers [facility]. Patient is agreeable to [facility] home health. Per Helene Kelp [facility] representative they can accept patient. Patient needs a walker and bedside commode. [facility] [employer] D[employer] agency representative is aware of above. CSW will continue to follow and assist as needed.                 Expected Discharge Plan: [place] Barriers to Discharge: Continued Medical Work up   Patient Goals and CMS Choice Patient states their goals for this hospitalization and ongoing recovery are:: Pain control. CMS Medicare.gov Compare Post Acute Care list provided to:: Patient Choice offered to / list presented to : Patient  Expected Discharge Plan and Services Expected Discharge Plan: [place] In-house Referral: Clinical Social Work   Post Acute Care Choice: [place] arrangements for the past 2 months: Mobile Home                 DME Arranged: Bedside commode, Walker rolling DME Agency: [employer] Date DME Agency Contacted: 12/11/18 Time DME Agency Contacted: [phone] Representative spoke with at DME Agency: [place]: PT [place]: [facility] (formerly  [employer]) Date [place]: 12/11/18 Time [place]: [place] Representative spoke with at [place] Arrangements/Services Living arrangements for the past 2 months: [place] with:: Self Patient language and need for interpreter reviewed:: No Do you feel safe going back to the place where you live?: Yes      Need for Family Participation in Patient Care: No (Comment) Care giver support system in place?: No (comment)   Criminal Activity/Legal Involvement Pertinent to Current Situation/Hospitalization: No - Comment as needed  Activities of Daily Living Home Assistive Devices/Equipment: Cane (specify quad or straight), Crutches ADL Screening (condition at time of admission) Patient's cognitive ability adequate to safely complete daily activities?: Yes Is the patient deaf or have difficulty hearing?: No Does the patient have difficulty seeing, even when wearing glasses/contacts?: No Does the patient have difficulty concentrating, remembering, or making decisions?: No Patient able to express need for assistance with ADLs?: Yes Does the patient have difficulty dressing or bathing?: No Independently performs ADLs?: Yes (appropriate for developmental age) Does the patient have difficulty walking or climbing stairs?: Yes Weakness of Legs: Right Weakness of Arms/Hands: None  Permission Sought/Granted Permission sought to share information with : (Home Health and DME agency) Permission granted to share information with : Yes, Verbal Permission Granted              Emotional Assessment Appearance:: Appears stated age   Affect (typically observed): Pleasant, Calm Orientation: : Oriented to Self,  Oriented to Place, Oriented to  Time, Oriented to Situation Alcohol / Substance Use: Not Applicable Psych Involvement: No (comment)  Admission diagnosis:  PRIMARY OSTEOARTHRITIS OF RIGHT KNEE Patient Active Problem List   Diagnosis Date  Noted  . Status post total knee replacement using cement, right 12/11/2018  . Chronic pain of right knee 06/05/2018  . 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 Primary osteoarthritis of right knee 05/08/2018  . 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 Morbid obesity with BMI of 40.0-44.9, adult ([place]) 12/28/2015   PCP:  [name], MD Pharmacy:   [facility] [place], [place] [address] Phone: [phone] Fax: [phone]  [facility] [address], [place] - [place] [address] Phone: [phone] Fax: [phone]     Social Determinants of Health (SDOH) Interventions    Readmission Risk Interventions No flowsheet data found.

## 2018-12-11 NOTE — Anesthesia Postprocedure Evaluation (Signed)
Anesthesia Post Note  Patient: Joshua Petersen  Procedure(s) Performed: TOTAL KNEE ARTHROPLASTY (Right )  Patient location during evaluation: PACU Anesthesia Type: General Level of consciousness: awake and alert Pain management: pain level controlled (Pt w chronic pain, post op still with pain, but periods of calm/relief) Vital Signs Assessment: post-procedure vital signs reviewed and stable Respiratory status: spontaneous breathing, nonlabored ventilation, respiratory function stable and patient connected to nasal cannula oxygen Cardiovascular status: blood pressure returned to baseline and stable Postop Assessment: no apparent nausea or vomiting Anesthetic complications: no     Last Vitals:  Vitals:   12/11/18 1232 12/11/18 1326  BP: (!) 162/98 (!) 173/90  Pulse: 90 91  Resp:    Temp: 36.7 C   SpO2: 96% 95%    Last Pain:  Vitals:   12/11/18 1232  TempSrc: Oral  PainSc:                  Alphonsus Sias

## 2018-12-11 NOTE — Evaluation (Signed)
Physical Therapy Evaluation Patient Details Name: Joshua Petersen MRN: [id number] DOB: Jul 17, 1955 Today's Date: 12/11/2018   History of Present Illness  Pt is a 63 yo male s/p elective R TKA.  PMH includes: HTN, morbid obesity, back pain, GERD, and L THA.  Clinical Impression  Pt presents with deficits in strength, transfers, mobility, gait, balance, R knee ROM, and activity tolerance but overall performed well during the session especially considering POD#0 status.  Pt was Mod Ind with bed mobility tasks requiring some increased time and effort to perform the tasks but did not require physical assistance.  Pt ws CGA with transfers with min verbal cues for sequencing and demonstrated good eccentric and concentric control.  Pt was able to amb 20' with a RW and CGA with decreased RLE stance time but with good stability and no R knee buckling.  Pt will benefit from HHPT services upon discharge to safely address above deficits for decreased caregiver assistance and eventual return to PLOF.      Follow Up Recommendations Home health PT    Equipment Recommendations  Rolling walker with 5" wheels;3in1 (PT)    Recommendations for Other Services       Precautions / Restrictions Precautions Precautions: Fall;Knee Restrictions Weight Bearing Restrictions: Yes RLE Weight Bearing: Weight bearing as tolerated      Mobility  Bed Mobility Overal bed mobility: Modified Independent             General bed mobility comments: Extra time and effort but no physical assistance required  Transfers Overall transfer level: Needs assistance Equipment used: Rolling walker (2 wheeled) Transfers: Sit to/from Stand Sit to Stand: Min guard         General transfer comment: Mod verbal cues for sequencing  Ambulation/Gait Ambulation/Gait assistance: Min guard Gait Distance (Feet): 20 Feet Assistive device: Rolling walker (2 wheeled) Gait Pattern/deviations: Step-through pattern;Decreased stance  time - right;Antalgic Gait velocity: decreased   General Gait Details: Antalgic on the RLE but steady without LOB or R knee buckling  Stairs            Wheelchair Mobility    Modified Rankin (Stroke Patients Only)       Balance Overall balance assessment: Needs assistance   Sitting balance-Leahy Scale: Normal     Standing balance support: Bilateral upper extremity supported Standing balance-Leahy Scale: Good                               Pertinent Vitals/Pain Pain Assessment: 0-10 Pain Score: 5  Pain Location: R knee Pain Descriptors / Indicators: Aching;Sore Pain Intervention(s): Premedicated before session;Monitored during session    [place] expects to be discharged to:: Private residence Living Arrangements: Alone Available Help at Discharge: Other (Comment)(No assistance available) Type of Home: Mobile home Home Access: Stairs to enter Entrance Stairs-Rails: Right;Left;Can reach both Entrance Stairs-Number of Steps: 6 Home Layout: One level Home Equipment: Other (comment)(Owns one loftstrand crutch, borrowed a RW after recent THA but no longer has it)      Prior Function Level of Independence: Independent with assistive device(s)         Comments: Pt Ind with amb without an AD mostly in the home but would often use one crutch in the community, no falls, Ind with ADLs     Hand Dominance        Extremity/Trunk Assessment   Upper Extremity Assessment Upper Extremity Assessment: Overall WFL for tasks assessed  Lower Extremity Assessment Lower Extremity Assessment: Generalized weakness;RLE deficits/detail RLE Deficits / Details: Pt able to perform Ind RLE SLR without extensor lag RLE: Unable to fully assess due to pain RLE Sensation: WNL       Communication   Communication: No difficulties  Cognition Arousal/Alertness: Awake/alert Behavior During Therapy: WFL for tasks assessed/performed Overall Cognitive  Status: Within Functional Limits for tasks assessed                                        General Comments      Exercises Total Joint Exercises Ankle Circles/Pumps: AROM;Both;10 reps Quad Sets: AROM;Strengthening;Right;5 reps;10 reps Hip ABduction/ADduction: AROM;Both;5 reps Straight Leg Raises: AROM;Both;5 reps Long Arc Quad: AROM;Right;10 reps;15 reps Knee Flexion: AROM;Right;10 reps;15 reps Goniometric ROM: R knee AROM: 4-82 deg Marching in Standing: AROM;Both;10 reps;Standing Other Exercises Other Exercises: Positioning education to encourage R knee ext PROM in bed/recliner Other Exercises: HEP for RLE AROM with LAQs and QS x 10 each 5-6x/day   Assessment/Plan    PT Assessment Patient needs continued PT services  PT Problem List Decreased strength;Decreased range of motion;Decreased activity tolerance;Decreased balance;Decreased mobility;Decreased knowledge of use of DME       PT Treatment Interventions DME instruction;Gait training;Stair training;Functional mobility training;Therapeutic activities;Therapeutic exercise;Balance training;Patient/family education    PT Goals (Current goals can be found in the Care Plan section)  Acute Rehab PT Goals Patient Stated Goal: Decreased R knee pain and return home PT Goal Formulation: With patient Time For Goal Achievement: 12/24/18 Potential to Achieve Goals: Good    Frequency BID   Barriers to discharge        Co-evaluation               AM-PAC PT "6 Clicks" Mobility  Outcome Measure Help needed turning from your back to your side while in a flat bed without using bedrails?: A Little Help needed moving from lying on your back to sitting on the side of a flat bed without using bedrails?: A Little Help needed moving to and from a bed to a chair (including a wheelchair)?: A Little Help needed standing up from a chair using your arms (e.g., wheelchair or bedside chair)?: A Little Help needed to walk  in hospital room?: A Little Help needed climbing 3-5 steps with a railing? : A Little 6 Click Score: 18    End of Session Equipment Utilized During Treatment: Gait belt Activity Tolerance: Patient tolerated treatment well Patient left: in chair;with call bell/phone within reach;with chair alarm set;with SCD's reapplied;Other (comment)(Polar care donned to R knee) Nurse Communication: Mobility status PT Visit Diagnosis: Muscle weakness (generalized) (M62.81);Other abnormalities of gait and mobility (R26.89);Pain Pain - Right/Left: Right Pain - part of body: Knee    Time: 1440-1519 PT Time Calculation (min) (ACUTE ONLY): 39 min   Charges:   PT Evaluation $PT Eval Low Complexity: 1 Low PT Treatments $Therapeutic Exercise: 8-22 mins        D. Elly Modena PT, DPT 12/11/18, 4:15 PM

## 2018-12-11 NOTE — TOC Progression Note (Signed)
Transition of Care [facility]) - Progression Note    Patient Details  Name: Joshua Petersen MRN: [id number] Date of Birth: 1955-08-07  [facility] of [facility] CM/SW Contact  Enrico Eaddy, Lenice Llamas Phone Number: [phone]  12/11/2018, 11:54 AM  Clinical Narrative: Lovenox price requested.           Expected Discharge Plan and Services                                                 Social Determinants of Health (SDOH) Interventions    Readmission Risk Interventions No flowsheet data found.

## 2018-12-11 NOTE — Progress Notes (Signed)
On pt's arrival to unit at 11:30, pt is in 10/10 pain in R knee. Given 1g IV dilaudid with little relief per pt report. Pt repeatedly asked to put his shorts on despite pain. RN retrieved his shorts out of his bag. A noise was coming from his shorts and it turned out a bottle of oxycodone 30mg  tablets was in his shorts pocket. The pills inside were not counted yet, but were freely moving inside container. Pt was educated that this along with his other home medications had to be stored in pharmacy while admitted. At this time (just after he was admitted to 1A), his medications were stored together under the cabinet by the sink until they could be taken to pharmacy. Later on when this was able to be done, this RN went to count oxycodone with pt. Pill bottle was not with his other home medications, ultimately it was found on his bed. The bottle now had a paper towel stuffed in it so that pills inside were muffled. Pt did not directly deny having the bottle since it was put away, he just stated that he would "help look for it."

## 2018-12-11 NOTE — TOC Benefit Eligibility Note (Signed)
Transition of Care [facility]) Benefit Eligibility Note    Patient Details  Name: Joshua Petersen MRN: [id number] Date of Birth: 04-12-1956   Medication/Dose: Lovenox 40 mg daily x 14 days           Spoke with Person/Company/Phone Number:: Checked [employer]-shows pt. has Medicaid, Active coverage for Rx 06/14/18-12/11/18, see if pt. has followed-up with Medicaid office to continue benefits                [place] Phone Number: 12/11/2018, 3:29 PM

## 2018-12-11 NOTE — Transfer of Care (Signed)
Immediate Anesthesia Transfer of Care Note  Patient: Joshua Petersen  Procedure(s) Performed: TOTAL KNEE ARTHROPLASTY (Right )  Patient Location: PACU  Anesthesia Type:General  Level of Consciousness: sedated  Airway & Oxygen Therapy: Patient Spontanous Breathing and Patient connected to nasal cannula oxygen  Post-op Assessment: Report given to RN and Post -op Vital signs reviewed and stable  Post vital signs: Reviewed and stable  Last Vitals:  Vitals Value Taken Time  BP 141/87 12/11/18 0953  Temp 37.4 C 12/11/18 0953  Pulse 88 12/11/18 0958  Resp 27 12/11/18 0958  SpO2 99 % 12/11/18 0958  Vitals shown include unvalidated device data.  Last Pain:  Vitals:   12/11/18 0614  TempSrc: Tympanic  PainSc: 3          Complications: No apparent anesthesia complications

## 2018-12-11 NOTE — H&P (Signed)
Paper H&P to be scanned into permanent record. H&P reviewed and patient re-examined. No changes. 

## 2018-12-12 ENCOUNTER — Emergency Department: Payer: Medicaid Other

## 2018-12-12 ENCOUNTER — Other Ambulatory Visit: Payer: Self-pay

## 2018-12-12 ENCOUNTER — Emergency Department
Admission: EM | Admit: 2018-12-12 | Discharge: 2018-12-12 | Discharge status: Against Medical Advice | Payer: Medicaid Other | Attending: Emergency Medicine | Admitting: Emergency Medicine

## 2018-12-12 DIAGNOSIS — Z96642 Presence of left artificial hip joint: Secondary | ICD-10-CM | POA: Insufficient documentation

## 2018-12-12 DIAGNOSIS — M25561 Pain in right knee: Secondary | ICD-10-CM | POA: Diagnosis not present

## 2018-12-12 DIAGNOSIS — Z7901 Long term (current) use of anticoagulants: Secondary | ICD-10-CM | POA: Insufficient documentation

## 2018-12-12 DIAGNOSIS — G8918 Other acute postprocedural pain: Secondary | ICD-10-CM | POA: Insufficient documentation

## 2018-12-12 DIAGNOSIS — Z96651 Presence of right artificial knee joint: Secondary | ICD-10-CM | POA: Insufficient documentation

## 2018-12-12 DIAGNOSIS — R079 Chest pain, unspecified: Secondary | ICD-10-CM | POA: Insufficient documentation

## 2018-12-12 DIAGNOSIS — I10 Essential (primary) hypertension: Secondary | ICD-10-CM | POA: Insufficient documentation

## 2018-12-12 DIAGNOSIS — Z79899 Other long term (current) drug therapy: Secondary | ICD-10-CM | POA: Insufficient documentation

## 2018-12-12 DIAGNOSIS — F17290 Nicotine dependence, other tobacco product, uncomplicated: Secondary | ICD-10-CM | POA: Insufficient documentation

## 2018-12-12 LAB — CBC WITH DIFFERENTIAL/PLATELET
Abs Immature Granulocytes: 0.08 10*3/uL — ABNORMAL HIGH (ref 0.00–0.07)
Abs Immature Granulocytes: 0.1 10*3/uL — ABNORMAL HIGH (ref 0.00–0.07)
Basophils Absolute: 0.1 10*3/uL (ref 0.0–0.1)
Basophils Absolute: 0.1 10*3/uL (ref 0.0–0.1)
Basophils Relative: 0 %
Basophils Relative: 0 %
Eosinophils Absolute: 0 10*3/uL (ref 0.0–0.5)
Eosinophils Absolute: 0 10*3/uL (ref 0.0–0.5)
Eosinophils Relative: 0 %
Eosinophils Relative: 0 %
HCT: 37.4 % — ABNORMAL LOW (ref 39.0–52.0)
HCT: 34.9 % — ABNORMAL LOW (ref 39.0–52.0)
Hemoglobin: 11.9 g/dL — ABNORMAL LOW (ref 13.0–17.0)
Hemoglobin: 11.5 g/dL — ABNORMAL LOW (ref 13.0–17.0)
Immature Granulocytes: 1 %
Immature Granulocytes: 1 %
Lymphocytes Relative: 19 %
Lymphocytes Relative: 14 %
Lymphs Abs: 2.8 10*3/uL (ref 0.7–4.0)
Lymphs Abs: 2.2 10*3/uL (ref 0.7–4.0)
MCH: 29.6 pg (ref 26.0–34.0)
MCH: 29.8 pg (ref 26.0–34.0)
MCHC: 31.8 g/dL (ref 30.0–36.0)
MCHC: 33 g/dL (ref 30.0–36.0)
MCV: 93 fL (ref 80.0–100.0)
MCV: 90.4 fL (ref 80.0–100.0)
Monocytes Absolute: 1.4 10*3/uL — ABNORMAL HIGH (ref 0.1–1.0)
Monocytes Absolute: 1.3 10*3/uL — ABNORMAL HIGH (ref 0.1–1.0)
Monocytes Relative: 9 %
Monocytes Relative: 8 %
Neutro Abs: 10.9 10*3/uL — ABNORMAL HIGH (ref 1.7–7.7)
Neutro Abs: 11.9 10*3/uL — ABNORMAL HIGH (ref 1.7–7.7)
Neutrophils Relative %: 71 %
Neutrophils Relative %: 77 %
Platelets: 249 10*3/uL (ref 150–400)
Platelets: 239 10*3/uL (ref 150–400)
RBC: 4.02 MIL/uL — ABNORMAL LOW (ref 4.22–5.81)
RBC: 3.86 MIL/uL — ABNORMAL LOW (ref 4.22–5.81)
RDW: 16 % — ABNORMAL HIGH (ref 11.5–15.5)
RDW: 16.1 % — ABNORMAL HIGH (ref 11.5–15.5)
WBC: 15.2 10*3/uL — ABNORMAL HIGH (ref 4.0–10.5)
WBC: 15.6 10*3/uL — ABNORMAL HIGH (ref 4.0–10.5)
nRBC: 0 % (ref 0.0–0.2)
nRBC: 0 % (ref 0.0–0.2)

## 2018-12-12 LAB — COMPREHENSIVE METABOLIC PANEL
ALT: 12 U/L (ref 0–44)
AST: 24 U/L (ref 15–41)
Albumin: 3.3 g/dL — ABNORMAL LOW (ref 3.5–5.0)
Alkaline Phosphatase: 55 U/L (ref 38–126)
Anion gap: 10 (ref 5–15)
BUN: 8 mg/dL (ref 8–23)
CO2: 29 mmol/L (ref 22–32)
Calcium: 8.2 mg/dL — ABNORMAL LOW (ref 8.9–10.3)
Chloride: 95 mmol/L — ABNORMAL LOW (ref 98–111)
Creatinine, Ser: 0.9 mg/dL (ref 0.61–1.24)
GFR calc Af Amer: >60 mL/min (ref >60)
GFR calc non Af Amer: >60 mL/min (ref >60)
Glucose, Bld: 134 mg/dL — ABNORMAL HIGH (ref 70–99)
Potassium: 3.7 mmol/L (ref 3.5–5.1)
Sodium: 134 mmol/L — ABNORMAL LOW (ref 135–145)
Total Bilirubin: 0.7 mg/dL (ref 0.3–1.2)
Total Protein: 6.4 g/dL — ABNORMAL LOW (ref 6.5–8.1)

## 2018-12-12 LAB — BASIC METABOLIC PANEL
Anion gap: 10 (ref 5–15)
BUN: 11 mg/dL (ref 8–23)
CO2: 29 mmol/L (ref 22–32)
Calcium: 8.4 mg/dL — ABNORMAL LOW (ref 8.9–10.3)
Chloride: 97 mmol/L — ABNORMAL LOW (ref 98–111)
Creatinine, Ser: 0.94 mg/dL (ref 0.61–1.24)
GFR calc Af Amer: >60 mL/min (ref >60)
GFR calc non Af Amer: >60 mL/min (ref >60)
Glucose, Bld: 135 mg/dL — ABNORMAL HIGH (ref 70–99)
Potassium: 4 mmol/L (ref 3.5–5.1)
Sodium: 136 mmol/L (ref 135–145)

## 2018-12-12 LAB — TROPONIN I (HIGH SENSITIVITY): Troponin I (High Sensitivity): 13 ng/L (ref <18)

## 2018-12-12 MED ORDER — ENOXAPARIN SODIUM 40 MG/0.4ML ~~LOC~~ SOLN: 40.0000 mg | Freq: Two times a day (BID) | SUBCUTANEOUS | 0 refills | Status: DC

## 2018-12-12 MED ORDER — ENOXAPARIN SODIUM 40 MG/0.4ML ~~LOC~~ SOLN: 40.0000 mg | SUBCUTANEOUS | 0 refills | Status: DC

## 2018-12-12 MED ORDER — HYDROMORPHONE HCL 1 MG/ML IJ SOLN
2.0000 mg | Freq: Once | INTRAMUSCULAR | Status: AC
  Administered 2018-12-12: 2 mg via INTRAVENOUS
  Filled 2018-12-12: qty 2

## 2018-12-12 MED ORDER — HYDROMORPHONE HCL 1 MG/ML IJ SOLN: 1.0000 mg | Freq: Once | INTRAMUSCULAR | Status: DC

## 2018-12-12 MED ORDER — OXYCODONE HCL 30 MG PO TABS: 30.0000 mg | ORAL_TABLET | ORAL | 0 refills | Status: DC | PRN

## 2018-12-12 MED ORDER — TRAMADOL HCL 50 MG PO TABS: 50.0000 mg | ORAL_TABLET | Freq: Four times a day (QID) | ORAL | 0 refills | Status: DC | PRN
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
## 2018-12-12 NOTE — ED Notes (Signed)
This nurse entered the room and found the patient sitting on the edge of the bed. Pt had removed his IV and was bleeding on the floor as well as the BP cuff, EKG leads, O2 sat sensor and his nasal cannula. Pt stated he "don't know what the end game is here" and stated that pain meds weren't going to help him. IV site bleeding controlled with direct pressure and dressed. NT asked to stay with him while EDP was notified. Pt placed in wheel chair at his request and called his son to come pick him up. EDP at bedside discussing situation with patient at this time.
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
## 2018-12-12 NOTE — Discharge Instructions (Signed)
As we discussed, I would recommend staying for complete work-up of your chest pain.  That being said, we cannot keep you here.  If you decide to return or pursue further work-up, please return to the ER immediately.  I recommend calling your orthopedist in the morning to discuss your pain and further management.

## 2018-12-12 NOTE — Progress Notes (Signed)
Physical Therapy Treatment Patient Details Name: Joshua Petersen MRN: [id number] DOB: 1956/05/04 Today's Date: 12/12/2018    History of Present Illness Pt is a 63 yo male s/p elective R TKA.  PMH includes: HTN, morbid obesity, back pain, GERD, and L THA.    PT Comments    Pt presents with deficits in strength, transfers, mobility, gait, balance, and activity tolerance but is making good progress towards goals.  Pt was SBA with transfers with good eccentric and concentric control.  Pt was able to amb 100' with a RW with the distance primarily limited by R knee pain.  Pt was stead with amb with improved cadence and RLE stance time.  Pt was able to ascend/descend 4 steps with B rails with good eccentric and concentric control.  Pt will benefit from HHPT services upon discharge to safely address above deficits for decreased caregiver assistance and eventual return to PLOF.     Follow Up Recommendations  Home health PT     Equipment Recommendations  Rolling walker with 5" wheels;3in1 (PT)    Recommendations for Other Services       Precautions / Restrictions Precautions Precautions: Fall;Knee Restrictions Weight Bearing Restrictions: Yes RLE Weight Bearing: Weight bearing as tolerated    Mobility  Bed Mobility               General bed mobility comments: NT, pt in recliner  Transfers Overall transfer level: Needs assistance Equipment used: Rolling walker (2 wheeled) Transfers: Sit to/from Stand Sit to Stand: Supervision         General transfer comment: Min verbal cues for sequencing with pt demontrating good eccentric and concentric control  Ambulation/Gait Ambulation/Gait assistance: Supervision Gait Distance (Feet): 100 Feet Assistive device: Rolling walker (2 wheeled) Gait Pattern/deviations: Step-through pattern;Decreased stance time - right;Antalgic Gait velocity: decreased   General Gait Details: Antalgic on the RLE but improving cadence, RLE stance time,  and good stability throughout   Stairs             Wheelchair Mobility    Modified Rankin (Stroke Patients Only)       Balance Overall balance assessment: Needs assistance   Sitting balance-Leahy Scale: Normal     Standing balance support: Bilateral upper extremity supported Standing balance-Leahy Scale: Good                              Cognition Arousal/Alertness: Awake/alert Behavior During Therapy: WFL for tasks assessed/performed Overall Cognitive Status: Within Functional Limits for tasks assessed                                        Exercises Total Joint Exercises Ankle Circles/Pumps: AROM;Both;10 reps Quad Sets: AROM;Strengthening;Right;5 reps;10 reps Hip ABduction/ADduction: AROM;Both;5 reps Straight Leg Raises: AROM;Both;5 reps Long Arc Quad: AROM;Right;10 reps;15 reps Knee Flexion: AROM;Right;10 reps;15 reps Goniometric ROM: R knee AROM: 5-88 deg Marching in Standing: AROM;Both;10 reps;Standing Other Exercises Other Exercises: HEP for RLE AROM with LAQs and QS x 10 each 5-6x/day    General Comments        Pertinent Vitals/Pain Pain Assessment: 0-10 Pain Score: 7  Pain Location: R knee Pain Descriptors / Indicators: Aching;Sore Pain Intervention(s): Monitored during session;Premedicated before session    Home Living  Prior Function            PT Goals (current goals can now be found in the care plan section) Progress towards PT goals: Progressing toward goals    Frequency    BID      PT Plan      Co-evaluation              AM-PAC PT "6 Clicks" Mobility   Outcome Measure  Help needed turning from your back to your side while in a flat bed without using bedrails?: A Little Help needed moving from lying on your back to sitting on the side of a flat bed without using bedrails?: A Little Help needed moving to and from a bed to a chair (including a wheelchair)?: A  Little Help needed standing up from a chair using your arms (e.g., wheelchair or bedside chair)?: A Little Help needed to walk in hospital room?: A Little Help needed climbing 3-5 steps with a railing? : A Little 6 Click Score: 18    End of Session Equipment Utilized During Treatment: Gait belt Activity Tolerance: Patient tolerated treatment well Patient left: in chair;with call bell/phone within reach;with chair alarm set;with SCD's reapplied;Other (comment)(Polar care donned to R knee) Nurse Communication: Mobility status PT Visit Diagnosis: Muscle weakness (generalized) (M62.81);Other abnormalities of gait and mobility (R26.89);Pain Pain - Right/Left: Right Pain - part of body: Knee     Time: 0940-1005 PT Time Calculation (min) (ACUTE ONLY): 25 min  Charges:  $Gait Training: 8-22 mins $Therapeutic Exercise: 8-22 mins                     D. Scott Khrista Braun PT, DPT 12/12/18, 12:06 PM

## 2018-12-12 NOTE — TOC Transition Note (Signed)
Transition of Care [facility]) - CM/SW Discharge Note   Patient Details  Name: Joshua Petersen MRN: [id number] Date of Birth: 1956/01/23  [facility] Care [facility]) CM/SW Contact:  Katalia Choma, Lenice Llamas Phone Number: [phone]  12/12/2018, 12:35 PM   Clinical Narrative: Clinical Social Worker (CSW) notified Helene Kelp [facility] representative that patient will D/C today. [facility] [employer] D[employer] agency representative delivered patient's rolling walker and bedside commode today. Patient is aware that his Lovenox price is $3 with Medicaid. Please reconsult if future social work needs arise. CSW signing off.       Final next level of care: [place] Barriers to Discharge: Barriers Resolved   Patient Goals and CMS Choice Patient states their goals for this hospitalization and ongoing recovery are:: Pain control CMS Medicare.gov Compare Post Acute Care list provided to:: Patient Choice offered to / list presented to : Patient  Discharge Placement                       Discharge Plan and Services In-house Referral: Clinical Social Work   Post Acute Care Choice: Home Health          DME Arranged: Gilford Rile rolling, Bedside commode DME Agency: [employer] Date DME Agency Contacted: 12/11/18 Time DME Agency Contacted: [phone] Representative spoke with at DME Agency: [place]: PT [place]: [facility] [employer]) Date [place]: 12/12/18 Time [place]: [id number] Representative spoke with at [place]: [place] ([place]) Interventions     Readmission Risk Interventions No flowsheet data found.

## 2018-12-12 NOTE — TOC Progression Note (Signed)
Transition of Care [facility]) - Progression Note    Patient Details  Name: Joshua Petersen MRN: [id number] Date of Birth: 1955-10-14  Transition [facility] Care [facility]) CM/SW Contact  Britten Seyfried, Lenice Llamas Phone Number: [phone]  12/12/2018, 8:48 AM  Clinical Narrative: Clinical Social Worker (CSW) met with patient and made him aware that his Lovenox should be $3 under medicaid. Patient verbalized his understanding.      Expected Discharge Plan: [place] Barriers to Discharge: Continued Medical Work up  Expected Discharge Plan and Services Expected Discharge Plan: [place] In-house Referral: Clinical Social Work   Post Acute Care Choice: [place] arrangements for the past 2 months: Mobile Home                 DME Arranged: Bedside commode, Walker rolling DME Agency: [employer] Date DME Agency Contacted: 12/11/18 Time DME Agency Contacted: [phone] Representative spoke with at DME Agency: [place]: PT [place]: [facility] [employer] Date [place]: 12/11/18 Time [place]: [place] Representative spoke with at [place]: [place] ([place]) Interventions    Readmission Risk Interventions No flowsheet data found.

## 2018-12-12 NOTE — ED Triage Notes (Signed)
Pt arrives to ED from home via [facility] with c/c of right knee pain s/p knee replacement surgery yesterday and secondary complaint of mild chest pain beginning today. EMS reports pt transport vitals 116/68, SPO2 98% on room air, p120, Sinus tachycardia, CBG 107. Pt given 324 aspirin enroute. 20G placed in left hand. Upon arrival, Pt A&Ox4, in acute pain due to knee, no respiratory Sx evident.

## 2018-12-12 NOTE — Discharge Instructions (Signed)
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
## 2018-12-12 NOTE — ED Notes (Addendum)
After discussing situation with Dr Ellender Hose, pt opted to leave AMA. Discharge paperwork printed and pt wheeled to sons truck at ED entrance. Pt moved safely to sons vehicle.

## 2019-04-05 ENCOUNTER — Emergency Department
Admission: EM | Admit: 2019-04-05 | Discharge: 2019-04-05 | Disposition: A | Discharge status: Home / Self Care | Payer: Medicaid Other | Attending: Emergency Medicine | Admitting: Emergency Medicine

## 2019-04-05 ENCOUNTER — Other Ambulatory Visit: Payer: Self-pay

## 2019-04-05 DIAGNOSIS — Z5321 Procedure and treatment not carried out due to patient leaving prior to being seen by health care provider: Secondary | ICD-10-CM | POA: Insufficient documentation

## 2019-04-05 DIAGNOSIS — M79673 Pain in unspecified foot: Secondary | ICD-10-CM | POA: Diagnosis not present

## 2019-04-05 NOTE — ED Notes (Signed)
Pt informed security that they were leaving and not waiting

## 2019-04-05 NOTE — ED Triage Notes (Signed)
Pt c/o BL foot pain, pt states he has been dx with plantar fascitis and states he cant function due to the pain.

## 2019-04-06 ENCOUNTER — Emergency Department
Admission: EM | Admit: 2019-04-06 | Discharge: 2019-04-06 | Disposition: A | Discharge status: Home / Self Care | Payer: Medicaid Other | Attending: Emergency Medicine | Admitting: Emergency Medicine

## 2019-04-06 ENCOUNTER — Other Ambulatory Visit: Payer: Self-pay

## 2019-04-06 ENCOUNTER — Emergency Department: Payer: Medicaid Other

## 2019-04-06 DIAGNOSIS — Z96642 Presence of left artificial hip joint: Secondary | ICD-10-CM | POA: Insufficient documentation

## 2019-04-06 DIAGNOSIS — G894 Chronic pain syndrome: Secondary | ICD-10-CM | POA: Diagnosis not present

## 2019-04-06 DIAGNOSIS — I10 Essential (primary) hypertension: Secondary | ICD-10-CM | POA: Insufficient documentation

## 2019-04-06 DIAGNOSIS — R079 Chest pain, unspecified: Secondary | ICD-10-CM | POA: Diagnosis present

## 2019-04-06 DIAGNOSIS — Z96651 Presence of right artificial knee joint: Secondary | ICD-10-CM | POA: Insufficient documentation

## 2019-04-06 DIAGNOSIS — F17220 Nicotine dependence, chewing tobacco, uncomplicated: Secondary | ICD-10-CM | POA: Diagnosis not present

## 2019-04-06 LAB — BASIC METABOLIC PANEL
Anion gap: 16 — ABNORMAL HIGH (ref 5–15)
BUN: 20 mg/dL (ref 8–23)
CO2: 26 mmol/L (ref 22–32)
Calcium: 9.5 mg/dL (ref 8.9–10.3)
Chloride: 92 mmol/L — ABNORMAL LOW (ref 98–111)
Creatinine, Ser: 0.86 mg/dL (ref 0.61–1.24)
GFR calc Af Amer: >60 mL/min (ref >60)
GFR calc non Af Amer: >60 mL/min (ref >60)
Glucose, Bld: 112 mg/dL — ABNORMAL HIGH (ref 70–99)
Potassium: 4.2 mmol/L (ref 3.5–5.1)
Sodium: 134 mmol/L — ABNORMAL LOW (ref 135–145)

## 2019-04-06 LAB — CBC
HCT: 42.2 % (ref 39.0–52.0)
Hemoglobin: 14.2 g/dL (ref 13.0–17.0)
MCH: 27.3 pg (ref 26.0–34.0)
MCHC: 33.6 g/dL (ref 30.0–36.0)
MCV: 81.2 fL (ref 80.0–100.0)
Platelets: 257 10*3/uL (ref 150–400)
RBC: 5.2 MIL/uL (ref 4.22–5.81)
RDW: 19.5 % — ABNORMAL HIGH (ref 11.5–15.5)
WBC: 13.6 10*3/uL — ABNORMAL HIGH (ref 4.0–10.5)
nRBC: 0 % (ref 0.0–0.2)

## 2019-04-06 LAB — TROPONIN I (HIGH SENSITIVITY): Troponin I (High Sensitivity): 8 ng/L (ref <18)

## 2019-04-06 MED ORDER — SODIUM CHLORIDE 0.9% FLUSH
3.0000 mL | Freq: Once | INTRAVENOUS | Status: AC
  Administered 2019-04-06: 3 mL via INTRAVENOUS

## 2019-04-06 MED ORDER — OXYCODONE-ACETAMINOPHEN 5-325 MG PO TABS
2.0000 | ORAL_TABLET | Freq: Once | ORAL | Status: AC
  Administered 2019-04-06: 12:00:00 2 via ORAL
  Filled 2019-04-06: qty 2

## 2019-04-06 MED ORDER — MORPHINE SULFATE (PF) 4 MG/ML IV SOLN
4.0000 mg | Freq: Once | INTRAVENOUS | Status: AC
  Administered 2019-04-06: 08:00:00 4 mg via INTRAVENOUS
  Filled 2019-04-06: qty 1

## 2019-04-06 NOTE — Care Management (Addendum)
ED RN CM seeking home health for physical therapy and nursing aide. Request sent to [facility], [facility] and [facility]. Will update as information becomes available.   Received denial from [place], [place] and [place]. Waiting on response from [place] and [facility].   Received denial from [place] and no response after multiple attempts from [facility].

## 2019-04-06 NOTE — ED Notes (Signed)
NAD noted at time of D/C. EDP aware of patient's HR, states okay for D/C. Pt taken to lobby via wheelchair by this RN. Pt denies comments/concerns regarding D/C instructions.

## 2019-04-06 NOTE — ED Notes (Signed)
Pt is requesting pain meds for his chronic back and hip pain, states his chest pain is mild. Pt talk a lot about his chronic pain issues,.

## 2019-04-06 NOTE — ED Triage Notes (Signed)
Pt come into the ED via EMS from home with c/o left sided chest pain radiating into the left arm today. Pt was seen here yesterday for chronic BL foot pain. States they took him off his pain meds several weeks ago and since unable to care for self, states he has a hard time walking at home.

## 2019-04-06 NOTE — TOC Initial Note (Signed)
Transition of Care [facility]) - Initial/Assessment Note    Patient Details  Name: Joshua Petersen MRN: [id number] Date of Birth: June 05, 1955  Transition of Care [facility]) CM/SW Contact:    Anselm Pancoast, RN Phone Number: 04/06/2019, 11:11 AM  Clinical Narrative:                 63 year old male admitted with chronic chest, foot and back pain. Patient anxious to get home and states he is "frustrated that he has not been able to get therapy in the past due to his insurance". Patient on the phone with his ride and planning to discharge home within the hour. RN CM advised MD ordered home health and currently searching for available agency. Patient states he has experience with trying to get home health but has never had home health.   Expected Discharge Plan: [place]     Patient Goals and CMS Choice Patient states their goals for this hospitalization and ongoing recovery are:: get home and get stronger      Expected Discharge Plan and Services Expected Discharge Plan: [place]       Living arrangements for the past 2 months: Single Family Home                                      Prior Living Arrangements/Services Living arrangements for the past 2 months: Single Family Home Lives with:: Self Patient language and need for interpreter reviewed:: Yes Do you feel safe going back to the place where you live?: Yes      Need for Family Participation in Patient Care: No (Comment) Care giver support system in place?: Yes (comment) Current home services: DME(walker, wheelchair) Criminal Activity/Legal Involvement Pertinent to Current Situation/Hospitalization: No - Comment as needed  Activities of Daily Living      Permission Sought/Granted Permission sought to share information with : Facility Sport and exercise psychologist, Case Optician, dispensing granted to share information with : Yes, Verbal Permission Granted              Emotional  Assessment Appearance:: Appears stated age Attitude/Demeanor/Rapport: Complaining Affect (typically observed): Agitated Orientation: : Oriented to Self, Oriented to Place, Oriented to  Time, Oriented to Situation   Psych Involvement: No (comment)  Admission diagnosis:  CP Patient Active Problem List   Diagnosis Date Noted  . Status post total knee replacement using cement, right 12/11/2018  . Chronic pain of right knee 06/05/2018  . 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 Primary osteoarthritis of right knee 05/08/2018  . 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 Morbid obesity with BMI of 40.0-44.9, adult ([place]) 12/28/2015   PCP:  [name], MD Pharmacy:   [facility], [place] [address] Phone: [phone] Fax: [phone]  [facility] [address] [place] [address] Phone: [phone] Fax: [phone]     Social Determinants of Health (SDOH) Interventions    Readmission Risk Interventions No flowsheet data found.
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
## 2019-04-14 ENCOUNTER — Emergency Department: Payer: Medicaid Other

## 2019-04-14 ENCOUNTER — Other Ambulatory Visit: Payer: Self-pay

## 2019-04-14 ENCOUNTER — Emergency Department
Admission: EM | Admit: 2019-04-14 | Discharge: 2019-04-14 | Disposition: A | Discharge status: Home / Self Care | Payer: Medicaid Other | Attending: Emergency Medicine | Admitting: Emergency Medicine

## 2019-04-14 ENCOUNTER — Encounter: Payer: Self-pay | Admitting: Emergency Medicine

## 2019-04-14 DIAGNOSIS — R079 Chest pain, unspecified: Secondary | ICD-10-CM | POA: Diagnosis present

## 2019-04-14 DIAGNOSIS — Z5321 Procedure and treatment not carried out due to patient leaving prior to being seen by health care provider: Secondary | ICD-10-CM | POA: Diagnosis not present

## 2019-04-14 LAB — BASIC METABOLIC PANEL
Anion gap: 18 — ABNORMAL HIGH (ref 5–15)
BUN: 14 mg/dL (ref 8–23)
CO2: 25 mmol/L (ref 22–32)
Calcium: 9 mg/dL (ref 8.9–10.3)
Chloride: 92 mmol/L — ABNORMAL LOW (ref 98–111)
Creatinine, Ser: 1.08 mg/dL (ref 0.61–1.24)
GFR calc Af Amer: >60 mL/min (ref >60)
GFR calc non Af Amer: >60 mL/min (ref >60)
Glucose, Bld: 113 mg/dL — ABNORMAL HIGH (ref 70–99)
Potassium: 3.9 mmol/L (ref 3.5–5.1)
Sodium: 135 mmol/L (ref 135–145)

## 2019-04-14 LAB — CBC
HCT: 41.8 % (ref 39.0–52.0)
Hemoglobin: 14.6 g/dL (ref 13.0–17.0)
MCH: 27.6 pg (ref 26.0–34.0)
MCHC: 34.9 g/dL (ref 30.0–36.0)
MCV: 79 fL — ABNORMAL LOW (ref 80.0–100.0)
Platelets: 227 10*3/uL (ref 150–400)
RBC: 5.29 MIL/uL (ref 4.22–5.81)
RDW: 19.9 % — ABNORMAL HIGH (ref 11.5–15.5)
WBC: 9.2 10*3/uL (ref 4.0–10.5)
nRBC: 0 % (ref 0.0–0.2)

## 2019-04-14 LAB — TROPONIN I (HIGH SENSITIVITY): Troponin I (High Sensitivity): 7 ng/L (ref <18)

## 2019-04-14 NOTE — ED Triage Notes (Signed)
Pt reports chest pain for over a year now that is sometimes sharpe, heavy, shooting etc.

## 2019-04-14 NOTE — ED Triage Notes (Signed)
First Nurse Note: Arrives via [facility].  C/O left sided chest pain radiating into left arm.  Patient taken off pain medications 3 months about and has been seen multiple times for similar symptoms.    18g LFA.  4 Baby ASA given PTA

## 2019-04-16 ENCOUNTER — Other Ambulatory Visit: Payer: Self-pay

## 2019-04-16 ENCOUNTER — Telehealth: Payer: Self-pay

## 2019-04-16 ENCOUNTER — Emergency Department
Admission: EM | Admit: 2019-04-16 | Discharge: 2019-04-16 | Disposition: A | Discharge status: Home / Self Care | Payer: Medicaid Other | Attending: Emergency Medicine | Admitting: Emergency Medicine

## 2019-04-16 DIAGNOSIS — I10 Essential (primary) hypertension: Secondary | ICD-10-CM | POA: Insufficient documentation

## 2019-04-16 DIAGNOSIS — G8929 Other chronic pain: Secondary | ICD-10-CM | POA: Diagnosis present

## 2019-04-16 DIAGNOSIS — Z96651 Presence of right artificial knee joint: Secondary | ICD-10-CM | POA: Insufficient documentation

## 2019-04-16 DIAGNOSIS — Z79899 Other long term (current) drug therapy: Secondary | ICD-10-CM | POA: Diagnosis not present

## 2019-04-16 DIAGNOSIS — Z96642 Presence of left artificial hip joint: Secondary | ICD-10-CM | POA: Diagnosis not present

## 2019-04-16 DIAGNOSIS — Z7901 Long term (current) use of anticoagulants: Secondary | ICD-10-CM | POA: Insufficient documentation

## 2019-04-16 DIAGNOSIS — F17220 Nicotine dependence, chewing tobacco, uncomplicated: Secondary | ICD-10-CM | POA: Insufficient documentation

## 2019-04-16 MED ORDER — LIDOCAINE 5 % EX PTCH
1.0000 | MEDICATED_PATCH | CUTANEOUS | Status: DC
  Administered 2019-04-16: 1 via TRANSDERMAL
  Filled 2019-04-16: qty 1

## 2019-04-16 MED ORDER — ACETAMINOPHEN 500 MG PO TABS
1000.0000 mg | ORAL_TABLET | Freq: Once | ORAL | Status: AC
  Administered 2019-04-16: 1000 mg via ORAL
  Filled 2019-04-16: qty 2

## 2019-04-16 NOTE — ED Notes (Signed)
Pt readjusted in bed and pillow provided. Pt requesting more pain medication and MD made aware. Pt updated that there would be no more pain medication at this time.

## 2019-04-16 NOTE — ED Notes (Signed)
Pt requesting to leave ED, stating he is in pain and not happy with the medications he was given to treat pain. Dr. Charna Archer made aware and ok to discharge patient. Pt walking through ED hall, steady gait, NAD noted.  Pt refusing d/c vitals, and d/c paperwork.  Pt called his son to pick him up from hospital and walked to lobby, NAD noted.

## 2019-04-16 NOTE — Care Management (Signed)
RN CM: Incoming call from [place], son states they talked with ALF and were told it would take weeks to get placed in ALF from home. RN CM advised son to schedule appointment with PCP and discuss desire to be placed. Also reviewed transportation assistance available through Medicaid and ability to call [place] DSS.   Incoming call from patient states he does not know what to do because he is not able to sit in a hospital bed for hours in an emergency department. RN CM advised patient to make appointment with PCP to reduce his wait time.   RN CM outreached to [place] @ ALF to update on status change and patient discharging home at his own wishes.

## 2019-04-16 NOTE — Care Management (Signed)
RN CM discussed patients wishes for discharge. Patient states up until 3 months ago he was independent and still working 2-3 days a week. Had misunderstanding with [facility] Pain clinic per patient and was stopped from all pain medications. Per patient since then he has been gradually getting worse and unable to care for himself. Patient discussed concerns with his PCP and was told he would have to follow up with a pain clinic. Patient has been unsuccessful in getting scheduled with new pain clinic due to misinformation in his records. Patient reports he has always followed the directions on his pain medication including his Methadone but failed a couple of urine tests causing problems. Patient states he is in need of dental care, back surgery and treatment for his plantar fascitis. Patient wants to transfer to [place] where his wife is as well.

## 2019-04-16 NOTE — Care Management (Signed)
RN CM: Call made to patient son, [name], to update with plans to transfer to ALF. [name] advised CM that he had just picked patient up from emergency department and was headed home. RN CM advised patient had requested placement at ALF and steps were being taken. [name] was apologetic and states his dad gets this way some times. RN CM advised [name] and patient to outreach to [employer], Scientist, physiological at [employer] and discuss possibility of patient admitting from home. [name] verbalized understanding and agreed.

## 2019-04-16 NOTE — TOC Initial Note (Signed)
Transition of Care [facility]) - Initial/Assessment Note    Patient Details  Name: Joshua Petersen MRN: [id number] Date of Birth: January 08, 1956  Transition of Care [facility]) CM/SW Contact:    Anselm Pancoast, RN Phone Number: 04/16/2019, 11:33 AM  Clinical Narrative:                 63 year old male admitted from home via EMS after calling case manager at hospital and reported he was not able to complete activities of daily living and had not eaten or been bathed in several days. Patient is requesting placement into long term care facility.  Case manager instructed patient to call EMS and get medical check. CM/SW will work on placement.    Expected Discharge Plan: [place]     Patient Goals and CMS Choice Patient states their goals for this hospitalization and ongoing recovery are:: Get pain controlled and get into long term nursing home      Expected Discharge Plan and Services Expected Discharge Plan: [place]       Living arrangements for the past 2 months: Single Family Home                                      Prior Living Arrangements/Services Living arrangements for the past 2 months: Single Family Home Lives with:: Self Patient language and need for interpreter reviewed:: Yes Do you feel safe going back to the place where you live?: No   no longer able to care for himself-seeking placement  Need for Family Participation in Patient Care: Yes (Comment) Care giver support system in place?: No (comment) Current home services: DME(walker, cane) Criminal Activity/Legal Involvement Pertinent to Current Situation/Hospitalization: No - Comment as needed  Activities of Daily Living      Permission Sought/Granted Permission sought to share information with : Case Manager, Customer service manager Permission granted to share information with : Yes, Verbal Permission Granted     Permission granted to share info w AGENCY: [facility]         Emotional Assessment Appearance:: Appears stated age, Disheveled Attitude/Demeanor/Rapport: Gracious Affect (typically observed): Agitated Orientation: : Oriented to Self, Oriented to Place, Oriented to  Time, Oriented to Situation   Psych Involvement: No (comment)  Admission diagnosis:  social work call Patient Active Problem List   Diagnosis Date Noted  . Status post total knee replacement using cement, right 12/11/2018  . Chronic pain of right knee 06/05/2018  . 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 Primary osteoarthritis of right knee 05/08/2018  . 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 Morbid obesity with BMI of 40.0-44.9, adult ([place]) 12/28/2015   PCP:  [name], MD Pharmacy:   [facility], [place] [address] Phone: [phone] Fax: [phone]  [facility] [address] [place] - [place] [address] Phone: [phone] Fax: [phone]     Social Determinants of Health (SDOH) Interventions    Readmission Risk Interventions No flowsheet data found.
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
## 2019-04-16 NOTE — ED Notes (Signed)
Pt voicing complaints that he is in pain, that he did not like the bed he was provided, and that he is upset he is not in a room. Pt reminded that he was given medications to treat pain.

## 2019-04-16 NOTE — ED Triage Notes (Signed)
Pt to ED via [facility] from home reporting that he spoke with case manager Andreas Newport regarding trouble caring for himself at home and was told to report to ED. Pt reports now c/o back pain. NAD noted.

## 2019-04-17 ENCOUNTER — Other Ambulatory Visit: Payer: Self-pay

## 2019-04-17 ENCOUNTER — Emergency Department: Payer: Medicaid Other

## 2019-04-17 ENCOUNTER — Emergency Department
Admission: EM | Admit: 2019-04-17 | Discharge: 2019-04-17 | Disposition: A | Discharge status: Home / Self Care | Payer: Medicaid Other | Attending: Emergency Medicine | Admitting: Emergency Medicine

## 2019-04-17 DIAGNOSIS — R072 Precordial pain: Secondary | ICD-10-CM | POA: Diagnosis not present

## 2019-04-17 DIAGNOSIS — I10 Essential (primary) hypertension: Secondary | ICD-10-CM | POA: Insufficient documentation

## 2019-04-17 DIAGNOSIS — Z7901 Long term (current) use of anticoagulants: Secondary | ICD-10-CM | POA: Diagnosis not present

## 2019-04-17 DIAGNOSIS — Z87891 Personal history of nicotine dependence: Secondary | ICD-10-CM | POA: Diagnosis not present

## 2019-04-17 DIAGNOSIS — Z79899 Other long term (current) drug therapy: Secondary | ICD-10-CM | POA: Insufficient documentation

## 2019-04-17 DIAGNOSIS — R0789 Other chest pain: Secondary | ICD-10-CM | POA: Diagnosis present

## 2019-04-17 DIAGNOSIS — Z96642 Presence of left artificial hip joint: Secondary | ICD-10-CM | POA: Diagnosis not present

## 2019-04-17 DIAGNOSIS — Z96651 Presence of right artificial knee joint: Secondary | ICD-10-CM | POA: Insufficient documentation

## 2019-04-17 LAB — CBC
HCT: 41.3 % (ref 39.0–52.0)
Hemoglobin: 14.9 g/dL (ref 13.0–17.0)
MCH: 28.1 pg (ref 26.0–34.0)
MCHC: 36.1 g/dL — ABNORMAL HIGH (ref 30.0–36.0)
MCV: 77.9 fL — ABNORMAL LOW (ref 80.0–100.0)
Platelets: 209 10*3/uL (ref 150–400)
RBC: 5.3 MIL/uL (ref 4.22–5.81)
RDW: 19.7 % — ABNORMAL HIGH (ref 11.5–15.5)
WBC: 8.4 10*3/uL (ref 4.0–10.5)
nRBC: 0 % (ref 0.0–0.2)

## 2019-04-17 LAB — BASIC METABOLIC PANEL
Anion gap: 19 — ABNORMAL HIGH (ref 5–15)
BUN: 12 mg/dL (ref 8–23)
CO2: 23 mmol/L (ref 22–32)
Calcium: 9 mg/dL (ref 8.9–10.3)
Chloride: 89 mmol/L — ABNORMAL LOW (ref 98–111)
Creatinine, Ser: 0.81 mg/dL (ref 0.61–1.24)
GFR calc Af Amer: >60 mL/min (ref >60)
GFR calc non Af Amer: >60 mL/min (ref >60)
Glucose, Bld: 109 mg/dL — ABNORMAL HIGH (ref 70–99)
Potassium: 3.2 mmol/L — ABNORMAL LOW (ref 3.5–5.1)
Sodium: 131 mmol/L — ABNORMAL LOW (ref 135–145)

## 2019-04-17 LAB — TROPONIN I (HIGH SENSITIVITY)
Troponin I (High Sensitivity): 9 ng/L (ref <18)
Troponin I (High Sensitivity): 9 ng/L (ref <18)

## 2019-04-17 LAB — PROTIME-INR
INR: 1 (ref 0.8–1.2)
Prothrombin Time: 12.7 seconds (ref 11.4–15.2)

## 2019-04-17 MED ORDER — GABAPENTIN 600 MG PO TABS
600.0000 mg | ORAL_TABLET | ORAL | Status: AC
  Administered 2019-04-17: 600 mg via ORAL
  Filled 2019-04-17: qty 1

## 2019-04-17 MED ORDER — SODIUM CHLORIDE 0.9% FLUSH: 3.0000 mL | Freq: Once | INTRAVENOUS | Status: DC

## 2019-04-17 MED ORDER — ASPIRIN 81 MG PO CHEW
324.0000 mg | CHEWABLE_TABLET | Freq: Once | ORAL | Status: AC
  Administered 2019-04-17: 324 mg via ORAL
  Filled 2019-04-17: qty 4

## 2019-04-17 MED ORDER — NITROGLYCERIN 0.4 MG SL SUBL
0.4000 mg | SUBLINGUAL_TABLET | SUBLINGUAL | Status: DC | PRN
  Administered 2019-04-17: 0.4 mg via SUBLINGUAL
  Filled 2019-04-17: qty 1

## 2019-04-17 NOTE — Discharge Instructions (Signed)

## 2019-04-17 NOTE — ED Notes (Signed)
Patient transported to X-ray 

## 2019-04-17 NOTE — ED Triage Notes (Signed)
Pt comes from home with CP starting today. Pt states that this happens when he doesn't sleep for a few days. Pt HR 130 in triage. Pt states crushing pain in center of chest. Pt gripping chest.
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
## 2019-04-17 NOTE — ED Notes (Signed)
Pt returned from Xray at this time  

## 2019-04-17 NOTE — ED Notes (Signed)
ED Provider at bedside. 

## 2019-04-22 ENCOUNTER — Other Ambulatory Visit: Payer: Self-pay

## 2019-04-22 DIAGNOSIS — F419 Anxiety disorder, unspecified: Secondary | ICD-10-CM | POA: Diagnosis present

## 2019-04-22 DIAGNOSIS — E871 Hypo-osmolality and hyponatremia: Secondary | ICD-10-CM | POA: Diagnosis present

## 2019-04-22 DIAGNOSIS — E876 Hypokalemia: Secondary | ICD-10-CM | POA: Diagnosis present

## 2019-04-22 DIAGNOSIS — Z96642 Presence of left artificial hip joint: Secondary | ICD-10-CM | POA: Diagnosis present

## 2019-04-22 DIAGNOSIS — J9601 Acute respiratory failure with hypoxia: Secondary | ICD-10-CM | POA: Diagnosis present

## 2019-04-22 DIAGNOSIS — T510X1A Toxic effect of ethanol, accidental (unintentional), initial encounter: Secondary | ICD-10-CM | POA: Diagnosis present

## 2019-04-22 DIAGNOSIS — F17220 Nicotine dependence, chewing tobacco, uncomplicated: Secondary | ICD-10-CM | POA: Diagnosis present

## 2019-04-22 DIAGNOSIS — Z66 Do not resuscitate: Secondary | ICD-10-CM | POA: Diagnosis not present

## 2019-04-22 DIAGNOSIS — G894 Chronic pain syndrome: Secondary | ICD-10-CM | POA: Diagnosis present

## 2019-04-22 DIAGNOSIS — F10239 Alcohol dependence with withdrawal, unspecified: Secondary | ICD-10-CM | POA: Diagnosis present

## 2019-04-22 DIAGNOSIS — N401 Enlarged prostate with lower urinary tract symptoms: Secondary | ICD-10-CM | POA: Diagnosis present

## 2019-04-22 DIAGNOSIS — J9602 Acute respiratory failure with hypercapnia: Secondary | ICD-10-CM | POA: Diagnosis present

## 2019-04-22 DIAGNOSIS — F1110 Opioid abuse, uncomplicated: Secondary | ICD-10-CM | POA: Diagnosis present

## 2019-04-22 DIAGNOSIS — K219 Gastro-esophageal reflux disease without esophagitis: Secondary | ICD-10-CM | POA: Diagnosis present

## 2019-04-22 DIAGNOSIS — Z96651 Presence of right artificial knee joint: Secondary | ICD-10-CM | POA: Diagnosis present

## 2019-04-22 DIAGNOSIS — I420 Dilated cardiomyopathy: Secondary | ICD-10-CM | POA: Diagnosis present

## 2019-04-22 DIAGNOSIS — Z9081 Acquired absence of spleen: Secondary | ICD-10-CM

## 2019-04-22 DIAGNOSIS — Z79899 Other long term (current) drug therapy: Secondary | ICD-10-CM

## 2019-04-22 DIAGNOSIS — R5381 Other malaise: Secondary | ICD-10-CM | POA: Diagnosis present

## 2019-04-22 DIAGNOSIS — I5023 Acute on chronic systolic (congestive) heart failure: Secondary | ICD-10-CM | POA: Diagnosis present

## 2019-04-22 DIAGNOSIS — Z6841 Body Mass Index (BMI) 40.0 and over, adult: Secondary | ICD-10-CM

## 2019-04-22 DIAGNOSIS — E870 Hyperosmolality and hypernatremia: Secondary | ICD-10-CM | POA: Diagnosis not present

## 2019-04-22 DIAGNOSIS — E7800 Pure hypercholesterolemia, unspecified: Secondary | ICD-10-CM | POA: Diagnosis present

## 2019-04-22 DIAGNOSIS — R338 Other retention of urine: Secondary | ICD-10-CM | POA: Diagnosis present

## 2019-04-22 DIAGNOSIS — T40601A Poisoning by unspecified narcotics, accidental (unintentional), initial encounter: Principal | ICD-10-CM | POA: Diagnosis present

## 2019-04-22 DIAGNOSIS — Z20828 Contact with and (suspected) exposure to other viral communicable diseases: Secondary | ICD-10-CM | POA: Diagnosis present

## 2019-04-22 DIAGNOSIS — N179 Acute kidney failure, unspecified: Secondary | ICD-10-CM | POA: Diagnosis present

## 2019-04-22 DIAGNOSIS — G92 Toxic encephalopathy: Secondary | ICD-10-CM | POA: Diagnosis present

## 2019-04-22 DIAGNOSIS — N138 Other obstructive and reflux uropathy: Secondary | ICD-10-CM | POA: Diagnosis present

## 2019-04-22 DIAGNOSIS — E785 Hyperlipidemia, unspecified: Secondary | ICD-10-CM | POA: Diagnosis present

## 2019-04-22 DIAGNOSIS — I110 Hypertensive heart disease with heart failure: Secondary | ICD-10-CM | POA: Diagnosis present

## 2019-04-22 DIAGNOSIS — I426 Alcoholic cardiomyopathy: Secondary | ICD-10-CM | POA: Diagnosis present

## 2019-04-22 DIAGNOSIS — E872 Acidosis: Secondary | ICD-10-CM | POA: Diagnosis present

## 2019-04-22 DIAGNOSIS — Z515 Encounter for palliative care: Secondary | ICD-10-CM | POA: Diagnosis not present

## 2019-04-22 NOTE — ED Triage Notes (Addendum)
Pt brought in by ems for chronic for years is here for pain management. States needs to get into the pain clinic.

## 2019-04-23 ENCOUNTER — Other Ambulatory Visit: Payer: Self-pay

## 2019-04-23 ENCOUNTER — Emergency Department: Payer: Medicaid Other

## 2019-04-23 ENCOUNTER — Inpatient Hospital Stay
Admission: EM | Admit: 2019-04-23 | Discharge: 2019-05-12 | DRG: 917 | Disposition: A | Discharge status: Skilled Nursing Facility | Payer: Medicaid Other | Attending: Internal Medicine | Admitting: Internal Medicine

## 2019-04-23 DIAGNOSIS — Z515 Encounter for palliative care: Secondary | ICD-10-CM | POA: Diagnosis not present

## 2019-04-23 DIAGNOSIS — I10 Essential (primary) hypertension: Secondary | ICD-10-CM | POA: Diagnosis not present

## 2019-04-23 DIAGNOSIS — I420 Dilated cardiomyopathy: Secondary | ICD-10-CM | POA: Diagnosis not present

## 2019-04-23 DIAGNOSIS — R0902 Hypoxemia: Secondary | ICD-10-CM

## 2019-04-23 DIAGNOSIS — Z20828 Contact with and (suspected) exposure to other viral communicable diseases: Secondary | ICD-10-CM | POA: Diagnosis present

## 2019-04-23 DIAGNOSIS — A419 Sepsis, unspecified organism: Secondary | ICD-10-CM

## 2019-04-23 DIAGNOSIS — I5023 Acute on chronic systolic (congestive) heart failure: Secondary | ICD-10-CM | POA: Diagnosis present

## 2019-04-23 DIAGNOSIS — E871 Hypo-osmolality and hyponatremia: Secondary | ICD-10-CM | POA: Diagnosis not present

## 2019-04-23 DIAGNOSIS — F1110 Opioid abuse, uncomplicated: Secondary | ICD-10-CM

## 2019-04-23 DIAGNOSIS — T50901D Poisoning by unspecified drugs, medicaments and biological substances, accidental (unintentional), subsequent encounter: Secondary | ICD-10-CM | POA: Diagnosis not present

## 2019-04-23 DIAGNOSIS — R942 Abnormal results of pulmonary function studies: Secondary | ICD-10-CM

## 2019-04-23 DIAGNOSIS — N138 Other obstructive and reflux uropathy: Secondary | ICD-10-CM | POA: Diagnosis present

## 2019-04-23 DIAGNOSIS — F419 Anxiety disorder, unspecified: Secondary | ICD-10-CM | POA: Diagnosis present

## 2019-04-23 DIAGNOSIS — R531 Weakness: Secondary | ICD-10-CM

## 2019-04-23 DIAGNOSIS — T40601A Poisoning by unspecified narcotics, accidental (unintentional), initial encounter: Secondary | ICD-10-CM | POA: Diagnosis present

## 2019-04-23 DIAGNOSIS — E876 Hypokalemia: Secondary | ICD-10-CM | POA: Diagnosis not present

## 2019-04-23 DIAGNOSIS — R4182 Altered mental status, unspecified: Secondary | ICD-10-CM

## 2019-04-23 DIAGNOSIS — E870 Hyperosmolality and hypernatremia: Secondary | ICD-10-CM | POA: Diagnosis not present

## 2019-04-23 DIAGNOSIS — T50901A Poisoning by unspecified drugs, medicaments and biological substances, accidental (unintentional), initial encounter: Secondary | ICD-10-CM | POA: Diagnosis present

## 2019-04-23 DIAGNOSIS — F10239 Alcohol dependence with withdrawal, unspecified: Secondary | ICD-10-CM | POA: Diagnosis present

## 2019-04-23 DIAGNOSIS — Z7189 Other specified counseling: Secondary | ICD-10-CM | POA: Diagnosis not present

## 2019-04-23 DIAGNOSIS — G92 Toxic encephalopathy: Secondary | ICD-10-CM | POA: Diagnosis present

## 2019-04-23 DIAGNOSIS — R0789 Other chest pain: Secondary | ICD-10-CM | POA: Diagnosis not present

## 2019-04-23 DIAGNOSIS — J9601 Acute respiratory failure with hypoxia: Secondary | ICD-10-CM | POA: Diagnosis present

## 2019-04-23 DIAGNOSIS — N401 Enlarged prostate with lower urinary tract symptoms: Secondary | ICD-10-CM | POA: Diagnosis present

## 2019-04-23 DIAGNOSIS — N179 Acute kidney failure, unspecified: Secondary | ICD-10-CM | POA: Diagnosis not present

## 2019-04-23 DIAGNOSIS — I426 Alcoholic cardiomyopathy: Secondary | ICD-10-CM

## 2019-04-23 DIAGNOSIS — Z66 Do not resuscitate: Secondary | ICD-10-CM | POA: Diagnosis not present

## 2019-04-23 DIAGNOSIS — F1010 Alcohol abuse, uncomplicated: Secondary | ICD-10-CM | POA: Diagnosis present

## 2019-04-23 DIAGNOSIS — Z6841 Body Mass Index (BMI) 40.0 and over, adult: Secondary | ICD-10-CM | POA: Diagnosis not present

## 2019-04-23 DIAGNOSIS — I5021 Acute systolic (congestive) heart failure: Secondary | ICD-10-CM

## 2019-04-23 DIAGNOSIS — F10939 Alcohol use, unspecified with withdrawal, unspecified: Secondary | ICD-10-CM | POA: Diagnosis not present

## 2019-04-23 DIAGNOSIS — E872 Acidosis: Secondary | ICD-10-CM | POA: Diagnosis not present

## 2019-04-23 DIAGNOSIS — G894 Chronic pain syndrome: Secondary | ICD-10-CM | POA: Diagnosis present

## 2019-04-23 DIAGNOSIS — G9341 Metabolic encephalopathy: Secondary | ICD-10-CM

## 2019-04-23 DIAGNOSIS — J9602 Acute respiratory failure with hypercapnia: Secondary | ICD-10-CM | POA: Diagnosis not present

## 2019-04-23 DIAGNOSIS — I509 Heart failure, unspecified: Secondary | ICD-10-CM | POA: Diagnosis not present

## 2019-04-23 DIAGNOSIS — E785 Hyperlipidemia, unspecified: Secondary | ICD-10-CM | POA: Diagnosis present

## 2019-04-23 DIAGNOSIS — R338 Other retention of urine: Secondary | ICD-10-CM | POA: Diagnosis present

## 2019-04-23 DIAGNOSIS — R062 Wheezing: Secondary | ICD-10-CM

## 2019-04-23 DIAGNOSIS — E7800 Pure hypercholesterolemia, unspecified: Secondary | ICD-10-CM | POA: Diagnosis present

## 2019-04-23 LAB — BLOOD GAS, ARTERIAL
Acid-base deficit: 0.7 mmol/L (ref 0.0–2.0)
Bicarbonate: 26.6 mmol/L (ref 20.0–28.0)
FIO2: 0.24
O2 Saturation: 95.1 %
Patient temperature: 37
pCO2 arterial: 54 mmHg — ABNORMAL HIGH (ref 32.0–48.0)
pH, Arterial: 7.3 — ABNORMAL LOW (ref 7.350–7.450)
pO2, Arterial: 84 mmHg (ref 83.0–108.0)

## 2019-04-23 LAB — BASIC METABOLIC PANEL
Anion gap: 16 — ABNORMAL HIGH (ref 5–15)
BUN: 37 mg/dL — ABNORMAL HIGH (ref 8–23)
CO2: 24 mmol/L (ref 22–32)
Calcium: 8 mg/dL — ABNORMAL LOW (ref 8.9–10.3)
Chloride: 89 mmol/L — ABNORMAL LOW (ref 98–111)
Creatinine, Ser: 3.7 mg/dL — ABNORMAL HIGH (ref 0.61–1.24)
GFR calc Af Amer: 19 mL/min — ABNORMAL LOW (ref >60)
GFR calc non Af Amer: 16 mL/min — ABNORMAL LOW (ref >60)
Glucose, Bld: 115 mg/dL — ABNORMAL HIGH (ref 70–99)
Potassium: 3.2 mmol/L — ABNORMAL LOW (ref 3.5–5.1)
Sodium: 129 mmol/L — ABNORMAL LOW (ref 135–145)

## 2019-04-23 LAB — BLOOD GAS, VENOUS
Acid-base deficit: 1.9 mmol/L (ref 0.0–2.0)
Acid-base deficit: 2.3 mmol/L — ABNORMAL HIGH (ref 0.0–2.0)
Bicarbonate: 28.4 mmol/L — ABNORMAL HIGH (ref 20.0–28.0)
Bicarbonate: 27.4 mmol/L (ref 20.0–28.0)
O2 Saturation: 52.7 %
O2 Saturation: 81 %
Patient temperature: 37
Patient temperature: 37
pCO2, Ven: 76 mmHg (ref 44.0–60.0)
pCO2, Ven: 70 mmHg — ABNORMAL HIGH (ref 44.0–60.0)
pH, Ven: 7.18 — CL (ref 7.250–7.430)
pH, Ven: 7.2 — ABNORMAL LOW (ref 7.250–7.430)
pO2, Ven: 36 mmHg (ref 32.0–45.0)
pO2, Ven: 56 mmHg — ABNORMAL HIGH (ref 32.0–45.0)

## 2019-04-23 LAB — COMPREHENSIVE METABOLIC PANEL
ALT: 51 U/L — ABNORMAL HIGH (ref 0–44)
AST: 137 U/L — ABNORMAL HIGH (ref 15–41)
Albumin: 4.1 g/dL (ref 3.5–5.0)
Alkaline Phosphatase: 75 U/L (ref 38–126)
Anion gap: 23 — ABNORMAL HIGH (ref 5–15)
BUN: 40 mg/dL — ABNORMAL HIGH (ref 8–23)
CO2: 24 mmol/L (ref 22–32)
Calcium: 8.9 mg/dL (ref 8.9–10.3)
Chloride: 81 mmol/L — ABNORMAL LOW (ref 98–111)
Creatinine, Ser: 4.12 mg/dL — ABNORMAL HIGH (ref 0.61–1.24)
GFR calc Af Amer: 17 mL/min — ABNORMAL LOW (ref >60)
GFR calc non Af Amer: 14 mL/min — ABNORMAL LOW (ref >60)
Glucose, Bld: 236 mg/dL — ABNORMAL HIGH (ref 70–99)
Potassium: 2.8 mmol/L — ABNORMAL LOW (ref 3.5–5.1)
Sodium: 128 mmol/L — ABNORMAL LOW (ref 135–145)
Total Bilirubin: 1.1 mg/dL (ref 0.3–1.2)
Total Protein: 8.2 g/dL — ABNORMAL HIGH (ref 6.5–8.1)

## 2019-04-23 LAB — URINE DRUG SCREEN, QUALITATIVE (ARMC ONLY)
Amphetamines, Ur Screen: NOT DETECTED
Barbiturates, Ur Screen: NOT DETECTED
Benzodiazepine, Ur Scrn: NOT DETECTED
Cannabinoid 50 Ng, Ur ~~LOC~~: NOT DETECTED
Cocaine Metabolite,Ur ~~LOC~~: NOT DETECTED
MDMA (Ecstasy)Ur Screen: NOT DETECTED
Methadone Scn, Ur: NOT DETECTED
Opiate, Ur Screen: POSITIVE — AB
Phencyclidine (PCP) Ur S: NOT DETECTED
Tricyclic, Ur Screen: POSITIVE — AB

## 2019-04-23 LAB — AMMONIA: Ammonia: 21 umol/L (ref 9–35)

## 2019-04-23 LAB — URINALYSIS, ROUTINE W REFLEX MICROSCOPIC
Bilirubin Urine: NEGATIVE
Glucose, UA: 150 mg/dL — AB
Ketones, ur: NEGATIVE mg/dL
Leukocytes,Ua: NEGATIVE
Nitrite: NEGATIVE
Protein, ur: 100 mg/dL — AB
Specific Gravity, Urine: 1.016 (ref 1.005–1.030)
Squamous Epithelial / HPF: NONE SEEN (ref 0–5)
pH: 5 (ref 5.0–8.0)

## 2019-04-23 LAB — TROPONIN I (HIGH SENSITIVITY): Troponin I (High Sensitivity): 30 ng/L — ABNORMAL HIGH (ref <18)

## 2019-04-23 LAB — CBC WITH DIFFERENTIAL/PLATELET
Abs Immature Granulocytes: 0.09 10*3/uL — ABNORMAL HIGH (ref 0.00–0.07)
Basophils Absolute: 0 10*3/uL (ref 0.0–0.1)
Basophils Relative: 0 %
Eosinophils Absolute: 0 10*3/uL (ref 0.0–0.5)
Eosinophils Relative: 0 %
HCT: 41.9 % (ref 39.0–52.0)
Hemoglobin: 13.9 g/dL (ref 13.0–17.0)
Immature Granulocytes: 1 %
Lymphocytes Relative: 6 %
Lymphs Abs: 0.6 10*3/uL — ABNORMAL LOW (ref 0.7–4.0)
MCH: 28.1 pg (ref 26.0–34.0)
MCHC: 33.2 g/dL (ref 30.0–36.0)
MCV: 84.8 fL (ref 80.0–100.0)
Monocytes Absolute: 0.5 10*3/uL (ref 0.1–1.0)
Monocytes Relative: 5 %
Neutro Abs: 8.8 10*3/uL — ABNORMAL HIGH (ref 1.7–7.7)
Neutrophils Relative %: 88 %
Platelets: 152 10*3/uL (ref 150–400)
RBC: 4.94 MIL/uL (ref 4.22–5.81)
RDW: 19.9 % — ABNORMAL HIGH (ref 11.5–15.5)
WBC: 10 10*3/uL (ref 4.0–10.5)
nRBC: 0.4 % — ABNORMAL HIGH (ref 0.0–0.2)

## 2019-04-23 LAB — GLUCOSE, CAPILLARY
Glucose-Capillary: 232 mg/dL — ABNORMAL HIGH (ref 70–99)
Glucose-Capillary: 85 mg/dL (ref 70–99)

## 2019-04-23 LAB — ETHANOL: Alcohol, Ethyl (B): <10 mg/dL (ref <10)

## 2019-04-23 LAB — LACTIC ACID, PLASMA
Lactic Acid, Venous: 5.1 mmol/L (ref 0.5–1.9)
Lactic Acid, Venous: 2.2 mmol/L (ref 0.5–1.9)
Lactic Acid, Venous: 1.9 mmol/L (ref 0.5–1.9)

## 2019-04-23 LAB — PROTIME-INR
INR: 1 (ref 0.8–1.2)
Prothrombin Time: 12.6 seconds (ref 11.4–15.2)

## 2019-04-23 LAB — MRSA PCR SCREENING: MRSA by PCR: NEGATIVE

## 2019-04-23 LAB — SARS CORONAVIRUS 2 (TAT 6-24 HRS): SARS Coronavirus 2: NEGATIVE

## 2019-04-23 LAB — ACETAMINOPHEN LEVEL: Acetaminophen (Tylenol), Serum: <10 ug/mL — ABNORMAL LOW (ref 10–30)

## 2019-04-23 LAB — MAGNESIUM: Magnesium: 2.4 mg/dL (ref 1.7–2.4)

## 2019-04-23 LAB — SALICYLATE LEVEL: Salicylate Lvl: <7 mg/dL (ref 2.8–30.0)

## 2019-04-23 LAB — APTT: aPTT: 26 seconds (ref 24–36)

## 2019-04-23 LAB — CK: Total CK: 105 U/L (ref 49–397)

## 2019-04-23 LAB — LIPASE, BLOOD: Lipase: 49 U/L (ref 11–51)

## 2019-04-23 MED ORDER — CHLORHEXIDINE GLUCONATE CLOTH 2 % EX PADS
6.0000 | MEDICATED_PAD | Freq: Every day | CUTANEOUS | Status: DC
  Administered 2019-04-23 – 2019-05-05 (×12): 6 via TOPICAL

## 2019-04-23 MED ORDER — SODIUM CHLORIDE 0.9 % IV BOLUS
1000.0000 mL | Freq: Once | INTRAVENOUS | Status: AC
  Administered 2019-04-23: 1000 mL via INTRAVENOUS

## 2019-04-23 MED ORDER — VANCOMYCIN HCL IN DEXTROSE 1-5 GM/200ML-% IV SOLN
1000.0000 mg | Freq: Once | INTRAVENOUS | Status: AC
  Administered 2019-04-23: 1000 mg via INTRAVENOUS
  Filled 2019-04-23: qty 200

## 2019-04-23 MED ORDER — ENOXAPARIN SODIUM 30 MG/0.3ML ~~LOC~~ SOLN
30.0000 mg | SUBCUTANEOUS | Status: DC
  Administered 2019-04-23 – 2019-04-24 (×2): 30 mg via SUBCUTANEOUS
  Filled 2019-04-23 (×2): qty 0.3

## 2019-04-23 MED ORDER — THIAMINE HCL 100 MG/ML IJ SOLN
Freq: Once | INTRAVENOUS | Status: DC
  Filled 2019-04-23: qty 1000

## 2019-04-23 MED ORDER — CLONIDINE HCL 0.1 MG PO TABS: 0.1000 mg | ORAL_TABLET | Freq: Two times a day (BID) | ORAL | Status: DC

## 2019-04-23 MED ORDER — NALOXONE HCL 2 MG/2ML IJ SOSY
1.0000 mg | PREFILLED_SYRINGE | Freq: Once | INTRAMUSCULAR | Status: AC
  Administered 2019-04-23: 1 mg via INTRAVENOUS

## 2019-04-23 MED ORDER — CHLORDIAZEPOXIDE HCL 5 MG PO CAPS
5.0000 mg | ORAL_CAPSULE | Freq: Three times a day (TID) | ORAL | Status: DC
  Administered 2019-04-23 – 2019-05-01 (×6): 5 mg via ORAL
  Filled 2019-04-23 (×9): qty 1

## 2019-04-23 MED ORDER — ORAL CARE MOUTH RINSE
15.0000 mL | Freq: Two times a day (BID) | OROMUCOSAL | Status: DC
  Administered 2019-04-24 – 2019-05-11 (×30): 15 mL via OROMUCOSAL

## 2019-04-23 MED ORDER — ADULT MULTIVITAMIN W/MINERALS CH
1.0000 | ORAL_TABLET | Freq: Every day | ORAL | Status: DC
  Administered 2019-04-23 – 2019-05-05 (×7): 1 via ORAL
  Filled 2019-04-23 (×7): qty 1

## 2019-04-23 MED ORDER — NALOXONE HCL 2 MG/2ML IJ SOSY
PREFILLED_SYRINGE | INTRAMUSCULAR | Status: AC
  Filled 2019-04-23: qty 4

## 2019-04-23 MED ORDER — POTASSIUM CHLORIDE 20 MEQ/15ML (10%) PO SOLN
20.0000 meq | Freq: Once | ORAL | Status: AC
  Administered 2019-04-23: 20 meq via ORAL
  Filled 2019-04-23: qty 15

## 2019-04-23 MED ORDER — SODIUM CHLORIDE 0.9 % IV SOLN
2.0000 g | Freq: Once | INTRAVENOUS | Status: AC
  Administered 2019-04-23: 2 g via INTRAVENOUS
  Filled 2019-04-23: qty 2

## 2019-04-23 MED ORDER — TRAMADOL HCL 50 MG PO TABS
50.0000 mg | ORAL_TABLET | Freq: Four times a day (QID) | ORAL | Status: DC | PRN
  Administered 2019-04-23 – 2019-04-24 (×3): 100 mg via ORAL
  Filled 2019-04-23 (×3): qty 2

## 2019-04-23 MED ORDER — THIAMINE HCL 100 MG/ML IJ SOLN
Freq: Once | INTRAVENOUS | Status: AC
  Administered 2019-04-23: 13:00:00 via INTRAVENOUS
  Filled 2019-04-23: qty 1000

## 2019-04-23 MED ORDER — FINASTERIDE 5 MG PO TABS
5.0000 mg | ORAL_TABLET | Freq: Every day | ORAL | Status: DC
  Administered 2019-04-23 – 2019-05-08 (×10): 5 mg via ORAL
  Filled 2019-04-23 (×10): qty 1

## 2019-04-23 MED ORDER — NALOXONE HCL 4 MG/10ML IJ SOLN
0.5000 mg/h | INTRAVENOUS | Status: DC
  Administered 2019-04-23 (×4): 0.5 mg/h via INTRAVENOUS
  Filled 2019-04-23 (×4): qty 10

## 2019-04-23 MED ORDER — SODIUM CHLORIDE 0.9 % IV SOLN
INTRAVENOUS | Status: DC
  Administered 2019-04-23 – 2019-04-24 (×2): via INTRAVENOUS

## 2019-04-23 MED ORDER — POTASSIUM CHLORIDE 10 MEQ/100ML IV SOLN
10.0000 meq | INTRAVENOUS | Status: AC
  Administered 2019-04-23 (×2): 10 meq via INTRAVENOUS
  Filled 2019-04-23 (×2): qty 100

## 2019-04-23 MED ORDER — PANTOPRAZOLE SODIUM 20 MG PO TBEC
20.0000 mg | DELAYED_RELEASE_TABLET | Freq: Every day | ORAL | Status: DC
  Administered 2019-04-23: 20 mg via ORAL
  Filled 2019-04-23 (×2): qty 1

## 2019-04-23 MED ORDER — SODIUM CHLORIDE 0.9 % IV SOLN: INTRAVENOUS | Status: DC

## 2019-04-23 MED ORDER — ONDANSETRON HCL 4 MG/2ML IJ SOLN
INTRAMUSCULAR | Status: AC
  Filled 2019-04-23: qty 2

## 2019-04-23 MED ORDER — TAMSULOSIN HCL 0.4 MG PO CAPS
0.8000 mg | ORAL_CAPSULE | Freq: Every day | ORAL | Status: DC
  Administered 2019-04-23 – 2019-05-08 (×10): 0.8 mg via ORAL
  Filled 2019-04-23 (×10): qty 2

## 2019-04-23 MED ORDER — LACTATED RINGERS IV BOLUS
1000.0000 mL | Freq: Once | INTRAVENOUS | Status: AC
  Administered 2019-04-23: 1000 mL via INTRAVENOUS

## 2019-04-23 MED ORDER — NALOXONE HCL 2 MG/2ML IJ SOSY
0.5000 mg | PREFILLED_SYRINGE | Freq: Once | INTRAMUSCULAR | Status: AC
  Administered 2019-04-23: 0.5 mg via INTRAVENOUS

## 2019-04-23 MED ORDER — ALBUTEROL SULFATE (2.5 MG/3ML) 0.083% IN NEBU
2.5000 mg | INHALATION_SOLUTION | Freq: Four times a day (QID) | RESPIRATORY_TRACT | Status: DC | PRN
  Administered 2019-04-26: 2.5 mg via RESPIRATORY_TRACT

## 2019-04-23 MED ORDER — ATORVASTATIN CALCIUM 20 MG PO TABS
40.0000 mg | ORAL_TABLET | Freq: Every day | ORAL | Status: DC
  Administered 2019-04-23 – 2019-05-04 (×6): 40 mg via ORAL
  Filled 2019-04-23 (×6): qty 2

## 2019-04-23 MED ORDER — METRONIDAZOLE IN NACL 5-0.79 MG/ML-% IV SOLN
500.0000 mg | Freq: Once | INTRAVENOUS | Status: AC
  Administered 2019-04-23: 500 mg via INTRAVENOUS
  Filled 2019-04-23: qty 100

## 2019-04-23 NOTE — Progress Notes (Signed)
Notified bedside nurse of need to order fluid bolus. 

## 2019-04-23 NOTE — Progress Notes (Signed)
Anticoagulation monitoring(Lovenox):  63yo  male ordered Lovenox 40 mg Q24h  Filed Weights   04/22/19 2216  Weight: 270 lb (122.5 kg)   BMI 37.66   Lab Results  Component Value Date   CREATININE 3.70 (H) 04/23/2019   CREATININE 4.12 (H) 04/23/2019   CREATININE 0.81 04/17/2019   Estimated Creatinine Clearance: 26.9 mL/min (A) (by C-G formula based on SCr of 3.7 mg/dL (H)). Hemoglobin & Hematocrit     Component Value Date/Time   HGB 13.9 04/23/2019 0130   HGB 15.2 06/18/2011 1352   HCT 41.9 04/23/2019 0130   HCT 45.5 06/18/2011 1352     Per Protocol for Patient with estCrcl < 30 ml/min and BMI < 40, will transition to Lovenox 30 mg Q24h.Marland Kitchen

## 2019-04-23 NOTE — ED Notes (Signed)
PT arrived with a large amount of dried stool caked on legs. Cleaned pt's legs and feet up. 0

## 2019-04-23 NOTE — ED Notes (Signed)
PT given Narcan 0.5 mg at 0210 and 0213 by Larry Sierras with verbal from Jari Pigg, MD d/t low o2 saturations. See paper MAR.

## 2019-04-23 NOTE — ED Notes (Signed)
PT with snoring respirations. Oriented upon verbal arousal.

## 2019-04-23 NOTE — Progress Notes (Signed)
Patient transported to CCU on bipap with no complications. When arrived in ccu patient was in no respiratory distress and was awake and answering question appropriately. Bipap taken off while RN was in room. Patient tolerating well at this time. Will continue to monitor.

## 2019-04-23 NOTE — Progress Notes (Signed)
CODE SEPSIS - PHARMACY COMMUNICATION  **Broad Spectrum Antibiotics should be administered within 1 hour of Sepsis diagnosis**  Time Code Sepsis Called/Page Received: 0300  Antibiotics Ordered: Cefepime, Flagyl and Vancomycin  Time of 1st antibiotic administration: 0348, Cefepime  Additional action taken by pharmacy: n/a  If necessary, Name of Provider/Nurse Contacted: n/a  Ena Dawley ,PharmD Clinical Pharmacist  04/23/2019  4:01 AM

## 2019-04-23 NOTE — Progress Notes (Signed)
PHARMACY -  BRIEF ANTIBIOTIC NOTE   Pharmacy has received consult(s) for Vancomycin and Cefepime from an ED provider.  The patient's profile has been reviewed for ht/wt/allergies/indication/available labs.    One time order(s) placed for Cefepime 2gm and Vancomycin 2gm (1gm bag + 1gm bag)  Further antibiotics/pharmacy consults should be ordered by admitting physician if indicated.                       Thank you, Hart Robinsons A 04/23/2019  4:03 AM

## 2019-04-23 NOTE — ED Notes (Signed)
Bladder scan reveals greater than 859 mL
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
## 2019-04-23 NOTE — ED Notes (Signed)
Called respiratory to let them know about VBG sent to lab
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
## 2019-04-24 ENCOUNTER — Encounter: Payer: Self-pay | Admitting: Internal Medicine

## 2019-04-24 DIAGNOSIS — F10239 Alcohol dependence with withdrawal, unspecified: Secondary | ICD-10-CM | POA: Diagnosis not present

## 2019-04-24 DIAGNOSIS — F10939 Alcohol use, unspecified with withdrawal, unspecified: Secondary | ICD-10-CM | POA: Diagnosis not present

## 2019-04-24 DIAGNOSIS — N179 Acute kidney failure, unspecified: Secondary | ICD-10-CM | POA: Diagnosis present

## 2019-04-24 DIAGNOSIS — J9601 Acute respiratory failure with hypoxia: Secondary | ICD-10-CM | POA: Diagnosis present

## 2019-04-24 DIAGNOSIS — J9602 Acute respiratory failure with hypercapnia: Secondary | ICD-10-CM | POA: Diagnosis present

## 2019-04-24 LAB — COMPREHENSIVE METABOLIC PANEL
ALT: 49 U/L — ABNORMAL HIGH (ref 0–44)
AST: 153 U/L — ABNORMAL HIGH (ref 15–41)
Albumin: 3.2 g/dL — ABNORMAL LOW (ref 3.5–5.0)
Alkaline Phosphatase: 56 U/L (ref 38–126)
Anion gap: 13 (ref 5–15)
BUN: 30 mg/dL — ABNORMAL HIGH (ref 8–23)
CO2: 25 mmol/L (ref 22–32)
Calcium: 8.2 mg/dL — ABNORMAL LOW (ref 8.9–10.3)
Chloride: 94 mmol/L — ABNORMAL LOW (ref 98–111)
Creatinine, Ser: 2.52 mg/dL — ABNORMAL HIGH (ref 0.61–1.24)
GFR calc Af Amer: 30 mL/min — ABNORMAL LOW (ref >60)
GFR calc non Af Amer: 26 mL/min — ABNORMAL LOW (ref >60)
Glucose, Bld: 102 mg/dL — ABNORMAL HIGH (ref 70–99)
Potassium: 3.2 mmol/L — ABNORMAL LOW (ref 3.5–5.1)
Sodium: 132 mmol/L — ABNORMAL LOW (ref 135–145)
Total Bilirubin: 1.2 mg/dL (ref 0.3–1.2)
Total Protein: 6.5 g/dL (ref 6.5–8.1)

## 2019-04-24 LAB — CBC
HCT: 36.5 % — ABNORMAL LOW (ref 39.0–52.0)
Hemoglobin: 12 g/dL — ABNORMAL LOW (ref 13.0–17.0)
MCH: 28.4 pg (ref 26.0–34.0)
MCHC: 32.9 g/dL (ref 30.0–36.0)
MCV: 86.5 fL (ref 80.0–100.0)
Platelets: 122 10*3/uL — ABNORMAL LOW (ref 150–400)
RBC: 4.22 MIL/uL (ref 4.22–5.81)
RDW: 20.2 % — ABNORMAL HIGH (ref 11.5–15.5)
WBC: 8.9 10*3/uL (ref 4.0–10.5)
nRBC: 0.2 % (ref 0.0–0.2)

## 2019-04-24 LAB — URINE CULTURE: Culture: NO GROWTH

## 2019-04-24 LAB — HIV ANTIBODY (ROUTINE TESTING W REFLEX): HIV Screen 4th Generation wRfx: NONREACTIVE

## 2019-04-24 MED ORDER — LORAZEPAM 2 MG/ML IJ SOLN
1.0000 mg | INTRAMUSCULAR | Status: AC | PRN
  Administered 2019-04-24 – 2019-04-27 (×8): 2 mg via INTRAVENOUS
  Filled 2019-04-24: qty 2
  Filled 2019-04-24 (×7): qty 1

## 2019-04-24 MED ORDER — LORAZEPAM 1 MG PO TABS: 1.0000 mg | ORAL_TABLET | ORAL | Status: AC | PRN

## 2019-04-24 MED ORDER — PANTOPRAZOLE SODIUM 40 MG PO TBEC
40.0000 mg | DELAYED_RELEASE_TABLET | Freq: Every day | ORAL | Status: DC
  Administered 2019-04-24 – 2019-05-12 (×13): 40 mg via ORAL
  Filled 2019-04-24 (×12): qty 1

## 2019-04-24 MED ORDER — ENOXAPARIN SODIUM 40 MG/0.4ML ~~LOC~~ SOLN
40.0000 mg | SUBCUTANEOUS | Status: DC
  Administered 2019-04-25 – 2019-05-05 (×11): 40 mg via SUBCUTANEOUS
  Filled 2019-04-24 (×11): qty 0.4

## 2019-04-24 MED ORDER — THIAMINE HCL 100 MG/ML IJ SOLN
100.0000 mg | Freq: Every day | INTRAMUSCULAR | Status: DC
  Administered 2019-04-24 – 2019-04-27 (×4): 100 mg via INTRAVENOUS
  Filled 2019-04-24 (×4): qty 2

## 2019-04-24 MED ORDER — DEXMEDETOMIDINE HCL IN NACL 400 MCG/100ML IV SOLN
0.4000 ug/kg/h | INTRAVENOUS | Status: DC
  Administered 2019-04-24: 0.5 ug/kg/h via INTRAVENOUS
  Administered 2019-04-24: 0.8 ug/kg/h via INTRAVENOUS
  Administered 2019-04-24: 1 ug/kg/h via INTRAVENOUS
  Administered 2019-04-24: 0.8 ug/kg/h via INTRAVENOUS
  Administered 2019-04-24: 1.2 ug/kg/h via INTRAVENOUS
  Administered 2019-04-25: 0.8 ug/kg/h via INTRAVENOUS
  Administered 2019-04-25 (×3): 1.2 ug/kg/h via INTRAVENOUS
  Administered 2019-04-25: 0.4 ug/kg/h via INTRAVENOUS
  Administered 2019-04-25: 1.2 ug/kg/h via INTRAVENOUS
  Administered 2019-04-26: 1 ug/kg/h via INTRAVENOUS
  Administered 2019-04-26 – 2019-04-27 (×10): 1.2 ug/kg/h via INTRAVENOUS
  Administered 2019-04-27: 0.4 ug/kg/h via INTRAVENOUS
  Administered 2019-04-27: 0.8 ug/kg/h via INTRAVENOUS
  Filled 2019-04-24 (×23): qty 100

## 2019-04-24 MED ORDER — LORAZEPAM 2 MG/ML IJ SOLN: 2.0000 mg | INTRAMUSCULAR | Status: AC

## 2019-04-24 MED ORDER — POTASSIUM CHLORIDE IN NACL 20-0.9 MEQ/L-% IV SOLN
INTRAVENOUS | Status: DC
  Administered 2019-04-24 – 2019-04-25 (×2): via INTRAVENOUS
  Filled 2019-04-24 (×4): qty 1000

## 2019-04-24 MED ORDER — VITAMIN B-1 100 MG PO TABS: 100.0000 mg | ORAL_TABLET | Freq: Every day | ORAL | Status: DC

## 2019-04-24 MED ORDER — FOLIC ACID 1 MG PO TABS: 1.0000 mg | ORAL_TABLET | Freq: Every day | ORAL | Status: DC

## 2019-04-24 MED ORDER — ADULT MULTIVITAMIN W/MINERALS CH: 1.0000 | ORAL_TABLET | Freq: Every day | ORAL | Status: DC

## 2019-04-24 MED ORDER — LORAZEPAM 2 MG/ML IJ SOLN
INTRAMUSCULAR | Status: AC
  Administered 2019-04-24: 2 mg via INTRAVENOUS
  Filled 2019-04-24: qty 1

## 2019-04-24 NOTE — TOC Initial Note (Signed)
Transition of Care [facility]) - Initial/Assessment Note    Patient Details  Name: Joshua Petersen MRN: [id number] Date of Birth: [birth year]  [facility] CM/SW Contact:    Shade Flood, LCSW Phone Number: 04/24/2019, 3:21 PM  Clinical Narrative:                  Pt admitted from home. Pt unable to participate in assessment today due to hallucinations and agitation. Pt discussed in Rounds. Chart reviewed. Pt was recently here in the ED at [facility] CM was attempting to place pt in LTC at [facility] at that time. Pt was initially agreeable but then changed his mind and he was discharged home.  TOC is consulted at this time for placement. Will assess when pt stable and more oriented to be able to participate.  Expected Discharge Plan: Long Term Nursing Home Barriers to Discharge: Continued Medical Work up   Patient Goals and CMS Choice        Expected Discharge Plan and Services Expected Discharge Plan: [place]       Living arrangements for the past 2 months: Single Family Home                                      Prior Living Arrangements/Services Living arrangements for the past 2 months: Single Family Home Lives with:: Self Patient language and need for interpreter reviewed:: Yes        Need for Family Participation in Patient Care: Yes (Comment) Care giver support system in place?: No (comment) Current home services: DME Criminal Activity/Legal Involvement Pertinent to Current Situation/Hospitalization: No - Comment as needed  Activities of Daily Living Home Assistive Devices/Equipment: Walker (specify type) ADL Screening (condition at time of admission) Patient's cognitive ability adequate to safely complete daily activities?: Yes Is the patient deaf or have difficulty hearing?: No Does the patient have difficulty seeing, even when wearing glasses/contacts?: No Does the patient have difficulty concentrating, remembering, or making  decisions?: No Patient able to express need for assistance with ADLs?: Yes Does the patient have difficulty dressing or bathing?: Yes Independently performs ADLs?: No Communication: Independent Dressing (OT): Needs assistance Is this a change from baseline?: Pre-admission baseline Grooming: Needs assistance Is this a change from baseline?: Pre-admission baseline Feeding: Independent Bathing: Needs assistance Is this a change from baseline?: Pre-admission baseline Toileting: Needs assistance Is this a change from baseline?: Pre-admission baseline In/Out Bed: Needs assistance Is this a change from baseline?: Pre-admission baseline Walks in Home: Needs assistance Is this a change from baseline?: Pre-admission baseline Does the patient have difficulty walking or climbing stairs?: Yes Weakness of Legs: Both Weakness of Arms/Hands: None  Permission Sought/Granted                  Emotional Assessment Appearance:: Appears stated age Attitude/Demeanor/Rapport: Unable to Assess Affect (typically observed): Unable to Assess Orientation: : Oriented to Self   Psych Involvement: No (comment)  Admission diagnosis:  Drug overdose [T50.901A] Opioid abuse ([place]) [F11.10] Hypoxia [R09.02] AKI (acute kidney injury) ([place]) [N17.9] Altered mental status, unspecified altered mental status type [R41.82] Sepsis, due to unspecified organism, unspecified whether acute organ dysfunction present [facility]) [A41.9] Patient Active Problem List   Diagnosis Date Noted  . Drug overdose 04/23/2019  . Status post total knee replacement using cement, right 12/11/2018  . Chronic pain of right knee 06/05/2018  . 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 Primary osteoarthritis of right knee 05/08/2018  . 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 Morbid obesity with BMI of 40.0-44.9, adult (HCC) 12/28/2015   PCP:  Dione Housekeeper, MD Pharmacy:   [facility] [place] - [address] [place] [address] Phone: [phone] Fax: [phone]  [facility] [address] [place] - [address] [place] [address] Phone: [phone] Fax: [phone]     Social Determinants of Health (SDOH) Interventions    Readmission Risk Interventions Readmission Risk Prevention Plan 04/24/2019  Medication Review (RN Care Manager) Complete  Palliative Care Screening Not Applicable  Some recent data might be hidden

## 2019-04-24 NOTE — Progress Notes (Signed)
PT Cancellation Note  Patient Details Name: Joshua Petersen MRN: [id number] DOB: Dec 08, 1955   Cancelled Treatment:    Reason Eval/Treat Not Completed: Other (comment). Consult received and chart reviewed. Pt currently confused and hallucinating. Per RN, not appropriate for participation at this time. Will follow and re-attempt next date.   Cornie Herrington 04/24/2019, 2:15 PM Greggory Stallion, PT, DPT [phone]

## 2019-04-24 NOTE — Progress Notes (Addendum)
Joshua Petersen Patient found standing beside his bed. He was talking to the walls calling them his sons. Patient had tied his foley tubing in a knot and trying to walk around  In his room. Patient was asked to get back in bed. He did with a lot of persuation. He is hallucinating and talking to people around his bed. Dr. Lanney Gins called. Order for IV Lorazapam obtained. Given. Patient had also pulled out one of his IVs. Offering his lunch to [place] as he talked to several friends. Calmer after Lorazapam but still confused. Tremors also noted to be severe in bilateral arms.

## 2019-04-24 NOTE — Progress Notes (Signed)
OT Cancellation Note  Patient Details Name: Joshua Petersen MRN: [id number] DOB: [birth year]   Cancelled Treatment:    Reason Eval/Treat Not Completed: Other (comment). Consult received and chart reviewed. Pt recently confused and hallucinating; currently sleeping soundly. Per RN, not appropriate for participation at this time. Will follow and re-attempt next date.   Jeni Salles, MPH, MS, OTR/L ascom [phone] 04/24/19, 2:27 PM

## 2019-04-24 NOTE — Progress Notes (Signed)
After being heavily sedated and placed on Precedex IV, patient now asleep and controllable.

## 2019-04-24 NOTE — Progress Notes (Addendum)
Progress Note    Joshua Petersen  GYI:[id number] DOB: [birth year]  DOA: 04/23/2019 PCP: Dione Housekeeper, MD        Assessment/Plan:   Principal Problem:   Drug overdose Active Problems:   Alcohol withdrawal syndrome with complication, with unspecified complication (HCC)   Acute respiratory failure with hypoxia and hypercapnia (HCC)   AKI (acute kidney injury) (HCC)   Body mass index is 42.37 kg/m.   Accidental drug overdose: Discontinue IV Narcan infusion.  Acute hypoxemic and hypercapnic respiratory failure: He is off of BiPAP.  Continue oxygen via nasal cannula.  Acute toxic metabolic encephalopathy: Supportive care  Acute kidney injury: Improving.  Continue IV fluids for hydration.  Monitor BMP  BPH with acute urinary retention: Foley catheter in place.  Continue finasteride and Flomax.  Hypokalemia: Replete potassium  Hyponatremia: Continue IV fluids and monitor sodium levels.  Alcohol abuse with alcohol withdrawal syndrome: Patient has been started on Precedex drip and CIWA protocol.  Hypertension: Lisinopril and HCTZ have been held.  Debility: Consult PT and OT.  Social worker to assist with disposition.   Family Communication/Anticipated D/C date and plan/Code Status   DVT prophylaxis: Lovenox Code Status: Full code Family Communication: None  disposition Plan: Possible discharge to skilled nursing facility in 3 to 4 days     Subjective:   Patient was feeling fine when I saw him earlier this morning.  He told me that he took oxycodone and clonidine at different times for pain and shaking respectively.  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  He said he lives at home alone and believes that he would need some kind of rehabilitation prior to going back home.  However later in the day, I was informed by his nurse that he had become confused and he was having hallucinations.   Objective:    Vitals:   04/24/19 1100  04/24/19 1200 04/24/19 1400 04/24/19 1500  BP: 124/73  (!) 85/53 126/76  Pulse: 96  89 77  Resp: 13  15 (!) 7  Temp:  98.5 F (36.9 C)    TempSrc:      SpO2: 94%  91% 99%  Weight:      Height:        Intake/Output Summary (Last 24 hours) at 04/24/2019 1650 Last data filed at 04/24/2019 1404 Gross per 24 hour  Intake 2096.59 ml  Output 4050 ml  Net -1953.41 ml   Filed Weights   04/22/19 2216 04/23/19 0922  Weight: 122.5 kg 126.4 kg    Exam:  GEN: NAD SKIN: No rash EYES: EOMI ENT: MMM CV: RRR PULM: CTA B ABD: soft, ND, NT, +BS CNS: AAO x 3 at the time of my visit this morning, non focal EXT: No edema or tenderness GU: Foley cath draining amber urine  Data Reviewed:   I have personally reviewed following labs and imaging studies:  Labs: Labs show the following:   Basic Metabolic Panel: Recent Labs  Lab 04/17/19 1709 04/23/19 0130 04/23/19 0335 04/24/19 0642  NA 131* 128* 129* 132*  K 3.2* 2.8* 3.2* 3.2*  CL 89* 81* 89* 94*  CO2 23 24 24 25   GLUCOSE 109* 236* 115* 102*  BUN 12 40* 37* 30*  CREATININE 0.81 4.12* 3.70* 2.52*  CALCIUM 9.0 8.9 8.0* 8.2*  MG  --  2.4  --   --    GFR Estimated Creatinine Clearance: 38.4 mL/min (A) (by C-G formula based on  SCr of 2.52 mg/dL (H)). Liver Function Tests: Recent Labs  Lab 04/23/19 0130 04/24/19 0642  AST 137* 153*  ALT 51* 49*  ALKPHOS 75 56  BILITOT 1.1 1.2  PROT 8.2* 6.5  ALBUMIN 4.1 3.2*   Recent Labs  Lab 04/23/19 0130  LIPASE 49   Recent Labs  Lab 04/23/19 0402  AMMONIA 21   Coagulation profile Recent Labs  Lab 04/17/19 1709 04/23/19 0130  INR 1.0 1.0    CBC: Recent Labs  Lab 04/17/19 1709 04/23/19 0130 04/24/19 0642  WBC 8.4 10.0 8.9  NEUTROABS  --  8.8*  --   HGB 14.9 13.9 12.0*  HCT 41.3 41.9 36.5*  MCV 77.9* 84.8 86.5  PLT 209 152 122*   Cardiac Enzymes: Recent Labs  Lab 04/23/19 0130  CKTOTAL 105   BNP (last 3 results) No results for input(s): PROBNP in the  last 8760 hours. CBG: Recent Labs  Lab 04/23/19 0125 04/23/19 0937  GLUCAP 232* 85   D-Dimer: No results for input(s): DDIMER in the last 72 hours. Hgb A1c: No results for input(s): HGBA1C in the last 72 hours. Lipid Profile: No results for input(s): CHOL, HDL, LDLCALC, TRIG, CHOLHDL, LDLDIRECT in the last 72 hours. Thyroid function studies: No results for input(s): TSH, T4TOTAL, T3FREE, THYROIDAB in the last 72 hours.  Invalid input(s): FREET3 Anemia work up: No results for input(s): VITAMINB12, FOLATE, FERRITIN, TIBC, IRON, RETICCTPCT in the last 72 hours. Sepsis Labs: Recent Labs  Lab 04/17/19 1709 04/23/19 0130 04/23/19 0335 04/23/19 1154 04/24/19 0642  WBC 8.4 10.0  --   --  8.9  LATICACIDVEN  --  5.1* 2.2* 1.9  --     Microbiology Recent Results (from the past 240 hour(s))  Blood Culture (routine x 2)     Status: None (Preliminary result)   Collection Time: 04/23/19 12:30 AM   Specimen: BLOOD  Result Value Ref Range Status   Specimen Description [facility]LOOD LEFT [facility]  Final   Special Requests   Final    BOTTLES DRAWN AEROBIC AND ANAEROBIC Blood Culture adequate volume   Culture   Final    NO GROWTH 1 DAY Performed at [facility], [address] [place], [place] [address]    Report Status PENDING  Incomplete  Blood Culture (routine x 2)     Status: None (Preliminary result)   Collection Time: 04/23/19  3:35 AM   Specimen: BLOOD  Result Value Ref Range Status   Specimen Description BLOOD RIGHT HAND  Final   Special Requests   Final    BOTTLES DRAWN AEROBIC AND ANAEROBIC Blood Culture adequate volume   Culture   Final    NO GROWTH 1 DAY Performed at [facility], [address] [place], [place] [address]    Report Status PENDING  Incomplete  Urine culture     Status: None   Collection Time: 04/23/19  4:21 AM   Specimen: Urine, Clean Catch  Result Value Ref Range Status   Specimen Description   Final    URINE, CLEAN CATCH Performed at  [facility], [address] [place], [place] [address]    Special Requests   Final    NONE Performed at [facility], [address] [place], [place] [address]    Culture   Final    NO GROWTH Performed at [facility], [address] [place], [place] [address]    Report Status 04/24/2019 FINAL  Final  SARS CORONAVIRUS 2 (  TAT 6-24 HRS) Nasopharyngeal Nasopharyngeal Swab     Status: None   Collection Time: 04/23/19  5:30 AM   Specimen: Nasopharyngeal Swab  Result Value Ref Range Status   SARS Coronavirus 2 NEGATIVE NEGATIVE Final    Comment: (NOTE) SARS-CoV-2 target nucleic acids are NOT DETECTED. The SARS-CoV-2 RNA is generally detectable in upper and lower respiratory specimens during the acute phase of infection. Negative results do not preclude SARS-CoV-2 infection, do not rule out co-infections with other pathogens, and should not be used as the sole basis for treatment or other patient management decisions. Negative results must be combined with clinical observations, patient history, and epidemiological information. The expected result is Negative. Fact Sheet for Patients: SugarRoll.be Fact Sheet for Healthcare Providers: https://www.woods-mathews.com/ This test is not yet approved or cleared by the [place] FDA and  has been authorized for detection and/or diagnosis of SARS-CoV-2 by FDA under an Emergency Use Authorization (EUA). This EUA will remain  in effect (meaning this test can be used) for the duration of the COVID-19 declaration under Section 56 4(b)(1) of the Act, 21 U.S.C. section 360bbb-3(b)(1), unless the authorization is terminated or revoked sooner. Performed at [facility], [place] [address], [place], [place] [address]   MRSA PCR Screening     Status: None   Collection Time: 04/23/19  9:29 AM   Specimen: Nasopharyngeal  Result Value Ref Range Status   MRSA by PCR NEGATIVE  NEGATIVE Final    Comment:        The GeneXpert MRSA Assay (FDA approved for NASAL specimens only), is one component of a comprehensive MRSA colonization surveillance program. It is not intended to diagnose MRSA infection nor to guide or monitor treatment for MRSA infections. Performed at [facility], [address] [place], [place] [address]     Procedures and diagnostic studies:  CT Head Wo Contrast  Result Date: 04/23/2019 CLINICAL DATA:  Encephalopathy and chronic pain EXAM: CT HEAD WITHOUT CONTRAST CT CERVICAL SPINE WITHOUT CONTRAST TECHNIQUE: Multidetector CT imaging of the head and cervical spine was performed following the standard protocol without intravenous contrast. Multiplanar CT image reconstructions of the cervical spine were also generated. COMPARISON:  None. FINDINGS: CT HEAD FINDINGS Brain: There is no mass, hemorrhage or extra-axial collection. The size and configuration of the ventricles and extra-axial CSF spaces are normal. The brain parenchyma is normal, without evidence of acute or chronic infarction. Vascular: Atherosclerotic calcification of the internal carotid arteries at the skull base. No abnormal hyperdensity of the major intracranial arteries or dural venous sinuses. Skull: The visualized skull base, calvarium and extracranial soft tissues are normal. Sinuses/Orbits: No fluid levels or advanced mucosal thickening of the visualized paranasal sinuses. No mastoid or middle ear effusion. The orbits are normal. CT CERVICAL SPINE FINDINGS Alignment: No static subluxation. Facets are aligned. Occipital condyles are normally positioned. Skull base and vertebrae: No acute fracture. Soft tissues and spinal canal: No prevertebral fluid or swelling. No visible canal hematoma. Disc levels: No advanced spinal canal or neural foraminal stenosis. Upper chest: No pneumothorax, pulmonary nodule or pleural effusion. Other: Normal visualized paraspinal cervical soft  tissues. IMPRESSION: 1. No acute intracranial abnormality. 2. No acute fracture or static subluxation of the cervical spine. Electronically Signed   By: Ulyses Jarred M.D.   On: 04/23/2019 03:28   CT Cervical Spine Wo Contrast  Result Date: 04/23/2019 CLINICAL DATA:  Encephalopathy and chronic pain EXAM: CT HEAD WITHOUT CONTRAST CT CERVICAL SPINE WITHOUT CONTRAST TECHNIQUE: Multidetector  CT imaging of the head and cervical spine was performed following the standard protocol without intravenous contrast. Multiplanar CT image reconstructions of the cervical spine were also generated. COMPARISON:  None. FINDINGS: CT HEAD FINDINGS Brain: There is no mass, hemorrhage or extra-axial collection. The size and configuration of the ventricles and extra-axial CSF spaces are normal. The brain parenchyma is normal, without evidence of acute or chronic infarction. Vascular: Atherosclerotic calcification of the internal carotid arteries at the skull base. No abnormal hyperdensity of the major intracranial arteries or dural venous sinuses. Skull: The visualized skull base, calvarium and extracranial soft tissues are normal. Sinuses/Orbits: No fluid levels or advanced mucosal thickening of the visualized paranasal sinuses. No mastoid or middle ear effusion. The orbits are normal. CT CERVICAL SPINE FINDINGS Alignment: No static subluxation. Facets are aligned. Occipital condyles are normally positioned. Skull base and vertebrae: No acute fracture. Soft tissues and spinal canal: No prevertebral fluid or swelling. No visible canal hematoma. Disc levels: No advanced spinal canal or neural foraminal stenosis. Upper chest: No pneumothorax, pulmonary nodule or pleural effusion. Other: Normal visualized paraspinal cervical soft tissues. IMPRESSION: 1. No acute intracranial abnormality. 2. No acute fracture or static subluxation of the cervical spine. Electronically Signed   By: Deatra [place] M.D.   On: 04/23/2019 03:28   DG Chest  Port 1 View  Result Date: 04/23/2019 CLINICAL DATA:  Shortness of breath. Hypoxia. EXAM: PORTABLE CHEST 1 VIEW COMPARISON:  04/17/2019 FINDINGS: Lower lung volumes from prior exam. Mild cardiomegaly, likely accentuated by portable technique. No vascular congestion and peribronchial thickening. No pneumothorax or large pleural effusion. No confluent airspace disease. Chronic left lung base atelectasis or scarring again seen. IMPRESSION: 1. Lower lung volumes from exam 6 days ago. Cardiomegaly and vascular congestion, likely accentuated by technique 2. Peribronchial thickening may be pulmonary edema or bronchitis. Electronically Signed   By: Narda Rutherford M.D.   On: 04/23/2019 03:09    Medications:   . atorvastatin  40 mg Oral QHS  . chlordiazePOXIDE  5 mg Oral TID  . Chlorhexidine Gluconate Cloth  6 each Topical Daily  . [START ON 04/25/2019] enoxaparin (LOVENOX) injection  40 mg Subcutaneous Q24H  . finasteride  5 mg Oral Daily  . folic acid  1 mg Oral Daily  . mouth rinse  15 mL Mouth Rinse BID  . multivitamin with minerals  1 tablet Oral Daily  . pantoprazole  40 mg Oral Daily  . tamsulosin  0.8 mg Oral Daily  . thiamine  100 mg Oral Daily   Or  . thiamine  100 mg Intravenous Daily   Continuous Infusions: . 0.9 % NaCl with KCl 20 mEq / L    . dexmedetomidine (PRECEDEX) IV infusion 0.8 mcg/kg/hr (04/24/19 1545)     LOS: 1 day   Marvelyn Bouchillon  [facility]   *Please refer to [email or web address], password [email or web address] to get updated schedule on who will round on this patient, as hospitalists switch teams weekly. If 7PM-7AM, please contact night-coverage at [email or web address], password [email or web address] for any overnight needs.  04/24/2019, 4:50 PM

## 2019-04-24 NOTE — Progress Notes (Addendum)
1430 Precedex and Ativan have calmed Joshua Petersen, Joshua Petersen down. Now sleeping with regular respirations and oxygen per nasal cannula at 4 L.

## 2019-04-25 LAB — BASIC METABOLIC PANEL
Anion gap: 12 (ref 5–15)
BUN: 30 mg/dL — ABNORMAL HIGH (ref 8–23)
CO2: 23 mmol/L (ref 22–32)
Calcium: 8.6 mg/dL — ABNORMAL LOW (ref 8.9–10.3)
Chloride: 101 mmol/L (ref 98–111)
Creatinine, Ser: 2.2 mg/dL — ABNORMAL HIGH (ref 0.61–1.24)
GFR calc Af Amer: 35 mL/min — ABNORMAL LOW (ref >60)
GFR calc non Af Amer: 31 mL/min — ABNORMAL LOW (ref >60)
Glucose, Bld: 118 mg/dL — ABNORMAL HIGH (ref 70–99)
Potassium: 3.8 mmol/L (ref 3.5–5.1)
Sodium: 136 mmol/L (ref 135–145)

## 2019-04-25 LAB — CBC
HCT: 40.5 % (ref 39.0–52.0)
Hemoglobin: 13 g/dL (ref 13.0–17.0)
MCH: 28.3 pg (ref 26.0–34.0)
MCHC: 32.1 g/dL (ref 30.0–36.0)
MCV: 88.2 fL (ref 80.0–100.0)
Platelets: 136 10*3/uL — ABNORMAL LOW (ref 150–400)
RBC: 4.59 MIL/uL (ref 4.22–5.81)
RDW: 20.7 % — ABNORMAL HIGH (ref 11.5–15.5)
WBC: 9.4 10*3/uL (ref 4.0–10.5)
nRBC: 0.3 % — ABNORMAL HIGH (ref 0.0–0.2)

## 2019-04-25 LAB — MAGNESIUM: Magnesium: 1.9 mg/dL (ref 1.7–2.4)

## 2019-04-25 LAB — PHOSPHORUS: Phosphorus: 2.9 mg/dL (ref 2.5–4.6)

## 2019-04-25 MED ORDER — KCL IN DEXTROSE-NACL 20-5-0.9 MEQ/L-%-% IV SOLN
INTRAVENOUS | Status: DC
  Administered 2019-04-25 – 2019-04-26 (×2): via INTRAVENOUS
  Filled 2019-04-25 (×3): qty 1000

## 2019-04-25 MED ORDER — BUPRENORPHINE HCL-NALOXONE HCL 2-0.5 MG SL SUBL
1.0000 | SUBLINGUAL_TABLET | Freq: Every day | SUBLINGUAL | Status: DC
  Administered 2019-04-27 – 2019-05-05 (×10): 1 via SUBLINGUAL
  Filled 2019-04-25 (×9): qty 1

## 2019-04-25 NOTE — Progress Notes (Signed)
PT Cancellation Note  Patient Details Name: Joshua Petersen MRN: [id number] DOB: August 07, 1955   Cancelled Treatment:    Reason Eval/Treat Not Completed: Medical issues which prohibited therapy;Patient's level of consciousness(Chart reviewed, RN consulted. Per RN pt remains agitated and sedated. Will attempt PT evaluation again at later date/time.)  1:19 PM, 04/25/19 Etta Grandchild, PT, DPT Physical Therapist - [facility]  [phone] ([place])    Dhruv Christina C 04/25/2019, 1:19 PM

## 2019-04-25 NOTE — Progress Notes (Addendum)
Progress Note    Joshua Petersen  PFX:[id number] DOB: 12/19/1955  DOA: 04/23/2019 PCP: Dione Housekeeper, MD        Assessment/Plan:   Principal Problem:   Drug overdose Active Problems:   Alcohol withdrawal syndrome with complication, with unspecified complication (HCC)   Acute respiratory failure with hypoxia and hypercapnia (HCC)   AKI (acute kidney injury) (HCC)   Body mass index is 42.37 kg/m.   Accidental drug overdose: He is off of Narcan infusion.  Acute hypoxemic and hypercapnic respiratory failure: He is off of BiPAP.  Continue oxygen via nasal cannula.  Acute toxic metabolic encephalopathy: Supportive care  Acute kidney injury: Improving.  Continue IV fluids for hydration.  Monitor BMP  BPH with acute urinary retention: Foley catheter in place.  Continue finasteride and Flomax.  Hypokalemia: Improved  Hyponatremia: Improved  Alcohol abuse with alcohol withdrawal syndrome: Continue Precedex drip and CIWA protocol for sedation.  Hypertension: Lisinopril and HCTZ have been held.  Debility: Consult PT and OT.  Social worker to assist with disposition.   Family Communication/Anticipated D/C date and plan/Code Status   DVT prophylaxis: Lovenox Code Status: Full code Family Communication: None  disposition Plan: Possible discharge to skilled nursing facility in 3 to 4 days     Subjective:   Patient is sedated and unable to provide any history. According to his nurse, he became very confused and agitated when attempt was made to wean off Precedex infusion.   Objective:    Vitals:   04/25/19 1130 04/25/19 1200 04/25/19 1300 04/25/19 1400  BP: 137/89 131/86 (!) 137/94 (!) 134/98  Pulse: (!) 59 60 (!) 59 (!) 58  Resp: 10   10  Temp: 98.8 F (37.1 C)     TempSrc: Axillary     SpO2: 99%   100%  Weight:      Height:        Intake/Output Summary (Last 24 hours) at 04/25/2019 1545 Last data filed at 04/25/2019 1325 Gross per 24 hour    Intake 1160.29 ml  Output 3525 ml  Net -2364.71 ml   Filed Weights   04/22/19 2216 04/23/19 0922  Weight: 122.5 kg 126.4 kg    Exam:  GEN: NAD SKIN: No rash EYES: No pallor or icterus ENT: MMM CV: RRR PULM: CTA B ABD: soft, obese, NT, +BS CNS: sedated EXT: No edema or tenderness GU: Foley catheter draining amber urine    Data Reviewed:   I have personally reviewed following labs and imaging studies:  Labs: Labs show the following:   Basic Metabolic Panel: Recent Labs  Lab 04/23/19 0130 04/23/19 0335 04/24/19 0642 04/25/19 0640  NA 128* 129* 132* 136  K 2.8* 3.2* 3.2* 3.8  CL 81* 89* 94* 101  CO2 24 24 25 23   GLUCOSE 236* 115* 102* 118*  BUN 40* 37* 30* 30*  CREATININE 4.12* 3.70* 2.52* 2.20*  CALCIUM 8.9 8.0* 8.2* 8.6*  MG 2.4  --   --  1.9  PHOS  --   --   --  2.9   GFR Estimated Creatinine Clearance: 43.9 mL/min (A) (by C-G formula based on SCr of 2.2 mg/dL (H)). Liver Function Tests: Recent Labs  Lab 04/23/19 0130 04/24/19 0642  AST 137* 153*  ALT 51* 49*  ALKPHOS 75 56  BILITOT 1.1 1.2  PROT 8.2* 6.5  ALBUMIN 4.1 3.2*   Recent Labs  Lab 04/23/19 0130  LIPASE 49   Recent Labs  Lab 04/23/19 0402  AMMONIA 21   Coagulation profile Recent Labs  Lab 04/23/19 0130  INR 1.0    CBC: Recent Labs  Lab 04/23/19 0130 04/24/19 0642 04/25/19 0640  WBC 10.0 8.9 9.4  NEUTROABS 8.8*  --   --   HGB 13.9 12.0* 13.0  HCT 41.9 36.5* 40.5  MCV 84.8 86.5 88.2  PLT 152 122* 136*   Cardiac Enzymes: Recent Labs  Lab 04/23/19 0130  CKTOTAL 105   BNP (last 3 results) No results for input(s): PROBNP in the last 8760 hours. CBG: Recent Labs  Lab 04/23/19 0125 04/23/19 0937  GLUCAP 232* 85   D-Dimer: No results for input(s): DDIMER in the last 72 hours. Hgb A1c: No results for input(s): HGBA1C in the last 72 hours. Lipid Profile: No results for input(s): CHOL, HDL, LDLCALC, TRIG, CHOLHDL, LDLDIRECT in the last 72 hours. Thyroid  function studies: No results for input(s): TSH, T4TOTAL, T3FREE, THYROIDAB in the last 72 hours.  Invalid input(s): FREET3 Anemia work up: No results for input(s): VITAMINB12, FOLATE, FERRITIN, TIBC, IRON, RETICCTPCT in the last 72 hours. Sepsis Labs: Recent Labs  Lab 04/23/19 0130 04/23/19 0335 04/23/19 1154 04/24/19 0642 04/25/19 0640  WBC 10.0  --   --  8.9 9.4  LATICACIDVEN 5.1* 2.2* 1.9  --   --     Microbiology Recent Results (from the past 240 hour(s))  Blood Culture (routine x 2)     Status: None (Preliminary result)   Collection Time: 04/23/19 12:30 AM   Specimen: BLOOD  Result Value Ref Range Status   Specimen Description BLOOD LEFT [facility]  Final   Special Requests   Final    BOTTLES DRAWN AEROBIC AND ANAEROBIC Blood Culture adequate volume   Culture   Final    NO GROWTH 2 DAYS Performed at [facility], [address] [place], [place] [address]    Report Status PENDING  Incomplete  Blood Culture (routine x 2)     Status: None (Preliminary result)   Collection Time: 04/23/19  3:35 AM   Specimen: BLOOD  Result Value Ref Range Status   Specimen Description BLOOD RIGHT HAND  Final   Special Requests   Final    BOTTLES DRAWN AEROBIC AND ANAEROBIC Blood Culture adequate volume   Culture   Final    NO GROWTH 2 DAYS Performed at [facility], [address] [place], [place] [address]    Report Status PENDING  Incomplete  Urine culture     Status: None   Collection Time: 04/23/19  4:21 AM   Specimen: Urine, Clean Catch  Result Value Ref Range Status   Specimen Description   Final    URINE, CLEAN CATCH Performed at [facility], [address], [place], [place] [address]    Special Requests   Final    NONE Performed at [facility], [address], [place], [place] [address]    Culture   Final    NO GROWTH Performed at [facility], [place] [address] [place], [place] [address]    Report Status 04/24/2019 FINAL   Final  SARS CORONAVIRUS 2 (TAT 6-24 HRS) Nasopharyngeal Nasopharyngeal Swab     Status: None   Collection Time: 04/23/19  5:30 AM   Specimen: Nasopharyngeal Swab  Result Value Ref Range Status   SARS Coronavirus 2 NEGATIVE NEGATIVE Final    Comment: (NOTE) SARS-CoV-2 target nucleic acids are NOT DETECTED. The SARS-CoV-2 RNA is generally detectable in upper and lower respiratory specimens  during the acute phase of infection. Negative results do not preclude SARS-CoV-2 infection, do not rule out co-infections with other pathogens, and should not be used as the sole basis for treatment or other patient management decisions. Negative results must be combined with clinical observations, patient history, and epidemiological information. The expected result is Negative. Fact Sheet for Patients: HairSlick.no Fact Sheet for Healthcare Providers: quierodirigir.com This test is not yet approved or cleared by the [place] FDA and  has been authorized for detection and/or diagnosis of SARS-CoV-2 by FDA under an Emergency Use Authorization (EUA). This EUA will remain  in effect (meaning this test can be used) for the duration of the COVID-19 declaration under Section 56 4(b)(1) of the Act, 21 U.S.C. section 360bbb-3(b)(1), unless the authorization is terminated or revoked sooner. Performed at [facility], [address], [place], [place] [address]   MRSA PCR Screening     Status: None   Collection Time: 04/23/19  9:29 AM   Specimen: Nasopharyngeal  Result Value Ref Range Status   MRSA by PCR NEGATIVE NEGATIVE Final    Comment:        The GeneXpert MRSA Assay (FDA approved for NASAL specimens only), is one component of a comprehensive MRSA colonization surveillance program. It is not intended to diagnose MRSA infection nor to guide or monitor treatment for MRSA infections. Performed at [facility], [address] [place], [place] [address]     Procedures and diagnostic studies:  No results found.  Medications:   . atorvastatin  40 mg Oral QHS  . chlordiazePOXIDE  5 mg Oral TID  . Chlorhexidine Gluconate Cloth  6 each Topical Daily  . enoxaparin (LOVENOX) injection  40 mg Subcutaneous Q24H  . finasteride  5 mg Oral Daily  . folic acid  1 mg Oral Daily  . mouth rinse  15 mL Mouth Rinse BID  . multivitamin with minerals  1 tablet Oral Daily  . pantoprazole  40 mg Oral Daily  . tamsulosin  0.8 mg Oral Daily  . thiamine  100 mg Oral Daily   Or  . thiamine  100 mg Intravenous Daily   Continuous Infusions: . dexmedetomidine (PRECEDEX) IV infusion 1.2 mcg/kg/hr (04/25/19 1408)  . dextrose 5 % and 0.9 % NaCl with KCl 20 mEq/L       LOS: 2 days   Cayleb Jarnigan  [facility]   *Please refer to [email or web address], password [email or web address] to get updated schedule on who will round on this patient, as hospitalists switch teams weekly. If 7PM-7AM, please contact night-coverage at [email or web address], password [email or web address] for any overnight needs.  04/25/2019, 3:45 PM

## 2019-04-25 NOTE — Progress Notes (Signed)
Pt found to be attempting to get out bed. Was very diaphoretic and insisting that his daughter was born today and that he needed to go see her. Would not answer questions or respond to reorientation attempts. 2 mg ativan given per CIWA scale. Pt now resting calmly. Will continue to assess.

## 2019-04-25 NOTE — Progress Notes (Signed)
OT Cancellation Note  Patient Details Name: Joshua Petersen MRN: [id number] DOB: [birth year]   Cancelled Treatment:    Reason Eval/Treat Not Completed: Other (comment)  Medical issues which prohibited therapy Patient's level of consciousness- Chart review done and PT called OT after consulting with RN Per RN pt remains agitated and sedated. Will attempt OT evaluation again at later date/time.)  Rosalyn Gess  OTR/L,CLT 04/25/2019, 2:21 PM

## 2019-04-26 ENCOUNTER — Inpatient Hospital Stay: Payer: Medicaid Other

## 2019-04-26 LAB — BASIC METABOLIC PANEL
Anion gap: 13 (ref 5–15)
BUN: 34 mg/dL — ABNORMAL HIGH (ref 8–23)
CO2: 19 mmol/L — ABNORMAL LOW (ref 22–32)
Calcium: 8.5 mg/dL — ABNORMAL LOW (ref 8.9–10.3)
Chloride: 107 mmol/L (ref 98–111)
Creatinine, Ser: 1.67 mg/dL — ABNORMAL HIGH (ref 0.61–1.24)
GFR calc Af Amer: 49 mL/min — ABNORMAL LOW (ref >60)
GFR calc non Af Amer: 43 mL/min — ABNORMAL LOW (ref >60)
Glucose, Bld: 158 mg/dL — ABNORMAL HIGH (ref 70–99)
Potassium: 4.6 mmol/L (ref 3.5–5.1)
Sodium: 139 mmol/L (ref 135–145)

## 2019-04-26 MED ORDER — FUROSEMIDE 10 MG/ML IJ SOLN
40.0000 mg | Freq: Once | INTRAMUSCULAR | Status: AC
  Administered 2019-04-26: 40 mg via INTRAVENOUS
  Filled 2019-04-26: qty 4

## 2019-04-26 NOTE — Progress Notes (Signed)
PT Cancellation Note  Patient Details Name: Joshua Petersen MRN: [id number] DOB: [birth year]   Cancelled Treatment:    Reason Eval/Treat Not Completed: Medical issues which prohibited therapy;Patient's level of consciousness(RN consulted, reports pt remains agitated, on precedex drip, recently given ativan as well. Will reattempt evaluation at later date/time once pt is able to participate.)  3:33 PM, 04/26/19 Etta Grandchild, PT, DPT Physical Therapist - [facility]  [phone] ([place])    [facility] 04/26/2019, 3:33 PM

## 2019-04-26 NOTE — Progress Notes (Signed)
Progress Note    Joshua Petersen  DJM:[id number] DOB: Jan 19, 1956  DOA: 04/23/2019 PCP: Dione Housekeeper, MD        Assessment/Plan:   Principal Problem:   Drug overdose Active Problems:   Alcohol withdrawal syndrome with complication, with unspecified complication (HCC)   Acute respiratory failure with hypoxia and hypercapnia (HCC)   AKI (acute kidney injury) (HCC)   Body mass index is 42.37 kg/m.   Accidental drug overdose: He is off of Narcan infusion.  Acute hypoxemic and hypercapnic respiratory failure: He is off of BiPAP.  Continue oxygen via nasal cannula.  Acute toxic metabolic encephalopathy: Supportive care  Acute kidney injury: Improving.  Discontinue IV fluids because of fluid overload.  Monitor BMP  Fluid overload: Chest x-ray showed  showed pulmonary edema.  Treat with IV Lasix.  Obtain 2D echo.  BPH with acute urinary retention: Foley catheter in place.  Continue finasteride and Flomax.  Hypokalemia: Improved  Hyponatremia: Improved  Alcohol abuse with alcohol withdrawal syndrome: Continue Precedex drip and CIWA protocol for sedation.  Hypertension: Lisinopril and HCTZ have been held.  Debility: PT and OT evaluation when he is alert enough to participate in therapy.  Social worker to assist with disposition.   Family Communication/Anticipated D/C date and plan/Code Status   DVT prophylaxis: Lovenox Code Status: Full code Family Communication: None  disposition Plan: Possible discharge to skilled nursing facility in 3 to 4 days     Subjective:   Patient is still sedated and unable to provide any history.  According to his nurse, he had to be given IV Ativan this morning because he was agitated despite being on maximum dose of Precedex.  Objective:    Vitals:   04/26/19 1200 04/26/19 1300 04/26/19 1400 04/26/19 1500  BP: (!) 151/103 (!) 146/99 (!) 141/101 (!) 146/104  Pulse: 82 73 76 71  Resp: (!) 21 18 18 20   Temp: (!) 97.5 F  (36.4 C)     TempSrc: Axillary     SpO2: 98% 92% 95% 94%  Weight:      Height:        Intake/Output Summary (Last 24 hours) at 04/26/2019 1617 Last data filed at 04/26/2019 1500 Gross per 24 hour  Intake 2402.74 ml  Output 1000 ml  Net 1402.74 ml   Filed Weights   04/22/19 2216 04/23/19 0922  Weight: 122.5 kg 126.4 kg    Exam:   GEN: NAD SKIN: No rash EYES:  No pallor or icterus ENT: MMM CV: RRR PULM: CTA B ABD: soft, ND, NT, +BS CNS: sedated but arousable EXT: No edema or tenderness     Data Reviewed:   I have personally reviewed following labs and imaging studies:  Labs: Labs show the following:   Basic Metabolic Panel: Recent Labs  Lab 04/23/19 0130 04/23/19 0335 04/24/19 0642 04/25/19 0640 04/26/19 1240  NA 128* 129* 132* 136 139  K 2.8* 3.2* 3.2* 3.8 4.6  CL 81* 89* 94* 101 107  CO2 24 24 25 23  19*  GLUCOSE 236* 115* 102* 118* 158*  BUN 40* 37* 30* 30* 34*  CREATININE 4.12* 3.70* 2.52* 2.20* 1.67*  CALCIUM 8.9 8.0* 8.2* 8.6* 8.5*  MG 2.4  --   --  1.9  --   PHOS  --   --   --  2.9  --    GFR Estimated Creatinine Clearance: 57.9 mL/min (A) (by C-G formula based on SCr of 1.67 mg/dL (H)). Liver Function Tests:  Recent Labs  Lab 04/23/19 0130 04/24/19 0642  AST 137* 153*  ALT 51* 49*  ALKPHOS 75 56  BILITOT 1.1 1.2  PROT 8.2* 6.5  ALBUMIN 4.1 3.2*   Recent Labs  Lab 04/23/19 0130  LIPASE 49   Recent Labs  Lab 04/23/19 0402  AMMONIA 21   Coagulation profile Recent Labs  Lab 04/23/19 0130  INR 1.0    CBC: Recent Labs  Lab 04/23/19 0130 04/24/19 0642 04/25/19 0640  WBC 10.0 8.9 9.4  NEUTROABS 8.8*  --   --   HGB 13.9 12.0* 13.0  HCT 41.9 36.5* 40.5  MCV 84.8 86.5 88.2  PLT 152 122* 136*   Cardiac Enzymes: Recent Labs  Lab 04/23/19 0130  CKTOTAL 105   BNP (last 3 results) No results for input(s): PROBNP in the last 8760 hours. CBG: Recent Labs  Lab 04/23/19 0125 04/23/19 0937  GLUCAP 232* 85    D-Dimer: No results for input(s): DDIMER in the last 72 hours. Hgb A1c: No results for input(s): HGBA1C in the last 72 hours. Lipid Profile: No results for input(s): CHOL, HDL, LDLCALC, TRIG, CHOLHDL, LDLDIRECT in the last 72 hours. Thyroid function studies: No results for input(s): TSH, T4TOTAL, T3FREE, THYROIDAB in the last 72 hours.  Invalid input(s): FREET3 Anemia work up: No results for input(s): VITAMINB12, FOLATE, FERRITIN, TIBC, IRON, RETICCTPCT in the last 72 hours. Sepsis Labs: Recent Labs  Lab 04/23/19 0130 04/23/19 0335 04/23/19 1154 04/24/19 0642 04/25/19 0640  WBC 10.0  --   --  8.9 9.4  LATICACIDVEN 5.1* 2.2* 1.9  --   --     Microbiology Recent Results (from the past 240 hour(s))  Blood Culture (routine x 2)     Status: None (Preliminary result)   Collection Time: 04/23/19 12:30 AM   Specimen: BLOOD  Result Value Ref Range Status   Specimen Description [facility]LOOD LEFT [facility]  Final   Special Requests   Final    BOTTLES DRAWN AEROBIC AND ANAEROBIC Blood Culture adequate volume   Culture   Final    NO GROWTH 3 DAYS Performed at [facility], [address] [place], [place] [address]    Report Status PENDING  Incomplete  Blood Culture (routine x 2)     Status: None (Preliminary result)   Collection Time: 04/23/19  3:35 AM   Specimen: BLOOD  Result Value Ref Range Status   Specimen Description BLOOD RIGHT HAND  Final   Special Requests   Final    BOTTLES DRAWN AEROBIC AND ANAEROBIC Blood Culture adequate volume   Culture   Final    NO GROWTH 3 DAYS Performed at [facility], [address] [place], [place] [address]    Report Status PENDING  Incomplete  Urine culture     Status: None   Collection Time: 04/23/19  4:21 AM   Specimen: Urine, Clean Catch  Result Value Ref Range Status   Specimen Description   Final    URINE, CLEAN CATCH Performed at [facility], [address] [place], [place] [address]    Special Requests    Final    NONE Performed at [facility], [address] [place], [place] [address]    Culture   Final    NO GROWTH Performed at [facility], [address] [place], [place] [address]    Report Status 04/24/2019 FINAL  Final  SARS CORONAVIRUS 2 (TAT 6-24 HRS) Nasopharyngeal Nasopharyngeal Swab     Status:  None   Collection Time: 04/23/19  5:30 AM   Specimen: Nasopharyngeal Swab  Result Value Ref Range Status   SARS Coronavirus 2 NEGATIVE NEGATIVE Final    Comment: (NOTE) SARS-CoV-2 target nucleic acids are NOT DETECTED. The SARS-CoV-2 RNA is generally detectable in upper and lower respiratory specimens during the acute phase of infection. Negative results do not preclude SARS-CoV-2 infection, do not rule out co-infections with other pathogens, and should not be used as the sole basis for treatment or other patient management decisions. Negative results must be combined with clinical observations, patient history, and epidemiological information. The expected result is Negative. Fact Sheet for Patients: HairSlick.no Fact Sheet for Healthcare Providers: quierodirigir.com This test is not yet approved or cleared by the [place] FDA and  has been authorized for detection and/or diagnosis of SARS-CoV-2 by FDA under an Emergency Use Authorization (EUA). This EUA will remain  in effect (meaning this test can be used) for the duration of the COVID-19 declaration under Section 56 4(b)(1) of the Act, 21 U.S.C. section 360bbb-3(b)(1), unless the authorization is terminated or revoked sooner. Performed at [facility], [address] [place], [place] [address]   MRSA PCR Screening     Status: None   Collection Time: 04/23/19  9:29 AM   Specimen: Nasopharyngeal  Result Value Ref Range Status   MRSA by PCR NEGATIVE NEGATIVE Final    Comment:        The GeneXpert MRSA Assay (FDA approved for NASAL  specimens only), is one component of a comprehensive MRSA colonization surveillance program. It is not intended to diagnose MRSA infection nor to guide or monitor treatment for MRSA infections. Performed at [facility], [address] [place], [place] [address]     Procedures and diagnostic studies:  DG Chest [facility] 1 View  Result Date: 04/26/2019 CLINICAL DATA:  Wheezing. EXAM: PORTABLE CHEST 1 VIEW COMPARISON:  04/23/2019 FINDINGS: Cardiac silhouette mildly enlarged.  No mediastinal or hilar masses. There is vascular congestion, interstitial thickening and mild hazy central lung opacity, findings consistent with pulmonary edema. Findings have increased compared to the previous exam. No convincing pneumonia. Left lung base not fully imaged on this exam. No convincing pleural effusion. No pneumothorax. Skeletal structures are grossly intact. IMPRESSION: 1. Worsened lung aeration compared to the most recent prior study. Findings consistent with congestive heart failure with pulmonary edema. Electronically Signed   By: Amie Portland M.D.   On: 04/26/2019 15:09    Medications:   . atorvastatin  40 mg Oral QHS  . buprenorphine-naloxone  1 tablet Sublingual Daily  . chlordiazePOXIDE  5 mg Oral TID  . Chlorhexidine Gluconate Cloth  6 each Topical Daily  . enoxaparin (LOVENOX) injection  40 mg Subcutaneous Q24H  . finasteride  5 mg Oral Daily  . folic acid  1 mg Oral Daily  . furosemide  40 mg Intravenous Once  . mouth rinse  15 mL Mouth Rinse BID  . multivitamin with minerals  1 tablet Oral Daily  . pantoprazole  40 mg Oral Daily  . tamsulosin  0.8 mg Oral Daily  . thiamine  100 mg Oral Daily   Or  . thiamine  100 mg Intravenous Daily   Continuous Infusions: . dexmedetomidine (PRECEDEX) IV infusion 1.2 mcg/kg/hr (04/26/19 1500)     LOS: 3 days   Lenice Koper  [facility]   *Please refer to [email or web address], password [email or web address] to get updated schedule on who will round on  this patient,  as hospitalists switch teams weekly. If 7PM-7AM, please contact night-coverage at [email or web address], password [email or web address] for any overnight needs.  04/26/2019, 4:17 PM

## 2019-04-27 ENCOUNTER — Inpatient Hospital Stay (HOSPITAL_COMMUNITY)
Admit: 2019-04-27 | Discharge: 2019-04-27 | Disposition: A | Discharge status: Home / Self Care | Payer: Medicaid Other | Attending: Internal Medicine | Admitting: Internal Medicine

## 2019-04-27 ENCOUNTER — Inpatient Hospital Stay: Payer: Medicaid Other

## 2019-04-27 DIAGNOSIS — I509 Heart failure, unspecified: Secondary | ICD-10-CM

## 2019-04-27 DIAGNOSIS — I5043 Acute on chronic combined systolic (congestive) and diastolic (congestive) heart failure: Secondary | ICD-10-CM

## 2019-04-27 LAB — ECHOCARDIOGRAM COMPLETE
Height: 68 in
Weight: 4458.58 oz

## 2019-04-27 LAB — BASIC METABOLIC PANEL
Anion gap: 16 — ABNORMAL HIGH (ref 5–15)
BUN: 41 mg/dL — ABNORMAL HIGH (ref 8–23)
CO2: 20 mmol/L — ABNORMAL LOW (ref 22–32)
Calcium: 8.9 mg/dL (ref 8.9–10.3)
Chloride: 107 mmol/L (ref 98–111)
Creatinine, Ser: 1.71 mg/dL — ABNORMAL HIGH (ref 0.61–1.24)
GFR calc Af Amer: 48 mL/min — ABNORMAL LOW (ref >60)
GFR calc non Af Amer: 41 mL/min — ABNORMAL LOW (ref >60)
Glucose, Bld: 146 mg/dL — ABNORMAL HIGH (ref 70–99)
Potassium: 4.4 mmol/L (ref 3.5–5.1)
Sodium: 143 mmol/L (ref 135–145)

## 2019-04-27 LAB — CBC
HCT: 46.6 % (ref 39.0–52.0)
Hemoglobin: 15.4 g/dL (ref 13.0–17.0)
MCH: 28.1 pg (ref 26.0–34.0)
MCHC: 33 g/dL (ref 30.0–36.0)
MCV: 85 fL (ref 80.0–100.0)
Platelets: 166 10*3/uL (ref 150–400)
RBC: 5.48 MIL/uL (ref 4.22–5.81)
RDW: 22.1 % — ABNORMAL HIGH (ref 11.5–15.5)
WBC: 13.7 10*3/uL — ABNORMAL HIGH (ref 4.0–10.5)
nRBC: 6.2 % — ABNORMAL HIGH (ref 0.0–0.2)

## 2019-04-27 LAB — PATHOLOGIST SMEAR REVIEW

## 2019-04-27 MED ORDER — LORAZEPAM 1 MG PO TABS: 1.0000 mg | ORAL_TABLET | ORAL | Status: DC | PRN

## 2019-04-27 MED ORDER — LORAZEPAM 2 MG/ML IJ SOLN
INTRAMUSCULAR | Status: AC
  Administered 2019-04-27: 2 mg via INTRAVENOUS
  Filled 2019-04-27: qty 1

## 2019-04-27 MED ORDER — FUROSEMIDE 10 MG/ML IJ SOLN
20.0000 mg | Freq: Once | INTRAMUSCULAR | Status: AC
  Administered 2019-04-27: 20 mg via INTRAVENOUS
  Filled 2019-04-27: qty 2

## 2019-04-27 MED ORDER — PERFLUTREN LIPID MICROSPHERE
1.0000 mL | INTRAVENOUS | Status: AC | PRN
  Administered 2019-04-27: 2 mL via INTRAVENOUS
  Filled 2019-04-27: qty 10

## 2019-04-27 MED ORDER — LORAZEPAM 2 MG/ML IJ SOLN
1.0000 mg | INTRAMUSCULAR | Status: DC | PRN
  Administered 2019-04-28 (×3): 2 mg via INTRAVENOUS
  Administered 2019-04-29 (×3): 4 mg via INTRAVENOUS
  Administered 2019-04-29: 2 mg via INTRAVENOUS
  Filled 2019-04-27 (×2): qty 1
  Filled 2019-04-27 (×3): qty 2
  Filled 2019-04-27 (×2): qty 1

## 2019-04-27 NOTE — Progress Notes (Signed)
PT Cancellation Note  Patient Details Name: Joshua Petersen MRN: [id number] DOB: [birth year]   Cancelled Treatment:    Reason Eval/Treat Not Completed: Other (comment). 4th attempt to evaluate pt. He is still very lethargic. Able to open eyes briefly and tell me his name then falls back asleep quickly. Unable to follow commands to squeeze hands or ask history questions. Not appropriate this date for OOB mobility due to lethargy. Will dc current order. Please re-order when pt appropriate for participation.   Lizbett Garciagarcia 04/27/2019, 2:19 PM Greggory Stallion, PT, DPT [phone]

## 2019-04-27 NOTE — Progress Notes (Signed)
Remains on Precedex for sedation. Refused all PO  medications, fluids and food. Swung arms at nurse when attempted to arouse patient.

## 2019-04-27 NOTE — Progress Notes (Signed)
*  PRELIMINARY RESULTS* Echocardiogram 2D Echocardiogram has been performed.  Joshua Petersen Joshua Petersen Joshua Petersen Joshua Petersen 04/27/2019, 7:37 AM

## 2019-04-27 NOTE — Progress Notes (Signed)
OT Cancellation Note  Patient Details Name: Joshua Petersen MRN: [id number] DOB: April 06, 1956   Cancelled Treatment:    Reason Eval/Treat Not Completed: Patient not medically ready. Spoke with PT. Pt is still very lethargic. Able to open eyes briefly and tell me his name then falls back asleep quickly. Unable to follow commands to squeeze hands or ask history questions. Not appropriate this date for OOB mobility due to lethargy. Will dc current order. Please re-order when pt appropriate for participation.   Jeni Salles, MPH, MS, OTR/L ascom [phone] 04/27/19, 2:55 PM

## 2019-04-27 NOTE — Progress Notes (Addendum)
Progress Note    Joshua Petersen  QMG:[id number] DOB: [birth year]  DOA: 04/23/2019 PCP: Dione Housekeeper, MD        Assessment/Plan:   Principal Problem:   Drug overdose Active Problems:   Alcohol withdrawal syndrome with complication, with unspecified complication (HCC)   Acute respiratory failure with hypoxia and hypercapnia (HCC)   AKI (acute kidney injury) (HCC)   Body mass index is 42.37 kg/m.   Accidental drug overdose: Resolved. S/p Narcan infusion.  Acute hypoxemic and hypercapnic respiratory failure: He had to be placed on BiPAP last night because of acute pulmonary edema.  He is tolerating oxygen via nasal cannula.  Taper off as able.    Acute toxic metabolic encephalopathy: Supportive care  Acute kidney injury: Improving but creatinine is not back to baseline.  Creatinine was 0.81 on 04/17/2019.  Continue to monitor creatinine off of IV fluids.  Renal ultrasound has been ordered for further evaluation.  Fluid overload/acute pulmonary edema: He was given IV Lasix yesterday.  2D echo is pending.  BPH with acute urinary retention: Foley catheter in place.  Continue finasteride and Flomax as able.  Hypokalemia: Improved  Hyponatremia: Improved  Alcohol abuse with alcohol withdrawal syndrome: Taper off Precedex drip as able.  Continue CIWA protocol   Hypertension: Lisinopril and HCTZ have been held.  Debility: PT and OT evaluation when he is alert enough to participate in therapy.  Social worker to assist with disposition.   Family Communication/Anticipated D/C date and plan/Code Status   DVT prophylaxis: Lovenox Code Status: Full code Family Communication: None  disposition Plan: Possible discharge to skilled nursing facility in 3 to 4 days     Subjective:   Patient is still sedated on Precedex drip.  He is unable to provide any history.   Objective:    Vitals:   04/27/19 0400 04/27/19 0500 04/27/19 0600 04/27/19 0700  BP: (!) 139/101  (!) 157/108 (!) 160/101 (!) 154/110  Pulse: 75     Resp: (!) 22 20 (!) 24 (!) 22  Temp:      TempSrc:      SpO2: 98%     Weight:      Height:        Intake/Output Summary (Last 24 hours) at 04/27/2019 1033 Last data filed at 04/27/2019 0700 Gross per 24 hour  Intake 1332.59 ml  Output 1600 ml  Net -267.41 ml   Filed Weights   04/22/19 2216 04/23/19 0922  Weight: 122.5 kg 126.4 kg    Exam:   GEN: NAD SKIN: No rash EYES: PERRL ENT: MMM CV: RRR PULM: CTA B ABD: soft, ND, NT, +BS CNS: sedated,  EXT: No edema GU: Foley catheter draining amber urine     Data Reviewed:   I have personally reviewed following labs and imaging studies:  Labs: Labs show the following:   Basic Metabolic Panel: Recent Labs  Lab 04/23/19 0130 04/23/19 0335 04/24/19 0642 04/25/19 0640 04/26/19 1240 04/27/19 0928  NA 128* 129* 132* 136 139 143  K 2.8* 3.2* 3.2* 3.8 4.6 4.4  CL 81* 89* 94* 101 107 107  CO2 24 24 25 23  19* 20*  GLUCOSE 236* 115* 102* 118* 158* 146*  BUN 40* 37* 30* 30* 34* 41*  CREATININE 4.12* 3.70* 2.52* 2.20* 1.67* 1.71*  CALCIUM 8.9 8.0* 8.2* 8.6* 8.5* 8.9  MG 2.4  --   --  1.9  --   --   PHOS  --   --   --  2.9  --   --    GFR Estimated Creatinine Clearance: 56.5 mL/min (A) (by C-G formula based on SCr of 1.71 mg/dL (H)). Liver Function Tests: Recent Labs  Lab 04/23/19 0130 04/24/19 0642  AST 137* 153*  ALT 51* 49*  ALKPHOS 75 56  BILITOT 1.1 1.2  PROT 8.2* 6.5  ALBUMIN 4.1 3.2*   Recent Labs  Lab 04/23/19 0130  LIPASE 49   Recent Labs  Lab 04/23/19 0402  AMMONIA 21   Coagulation profile Recent Labs  Lab 04/23/19 0130  INR 1.0    CBC: Recent Labs  Lab 04/23/19 0130 04/24/19 0642 04/25/19 0640 04/27/19 0928  WBC 10.0 8.9 9.4 PENDING  NEUTROABS 8.8*  --   --   --   HGB 13.9 12.0* 13.0 15.4  HCT 41.9 36.5* 40.5 46.6  MCV 84.8 86.5 88.2 85.0  PLT 152 122* 136* 166   Cardiac Enzymes: Recent Labs  Lab 04/23/19 0130    CKTOTAL 105   BNP (last 3 results) No results for input(s): PROBNP in the last 8760 hours. CBG: Recent Labs  Lab 04/23/19 0125 04/23/19 0937  GLUCAP 232* 85   D-Dimer: No results for input(s): DDIMER in the last 72 hours. Hgb A1c: No results for input(s): HGBA1C in the last 72 hours. Lipid Profile: No results for input(s): CHOL, HDL, LDLCALC, TRIG, CHOLHDL, LDLDIRECT in the last 72 hours. Thyroid function studies: No results for input(s): TSH, T4TOTAL, T3FREE, THYROIDAB in the last 72 hours.  Invalid input(s): FREET3 Anemia work up: No results for input(s): VITAMINB12, FOLATE, FERRITIN, TIBC, IRON, RETICCTPCT in the last 72 hours. Sepsis Labs: Recent Labs  Lab 04/23/19 0130 04/23/19 0335 04/23/19 1154 04/24/19 0642 04/25/19 0640 04/27/19 0928  WBC 10.0  --   --  8.9 9.4 PENDING  LATICACIDVEN 5.1* 2.2* 1.9  --   --   --     Microbiology Recent Results (from the past 240 hour(s))  Blood Culture (routine x 2)     Status: None (Preliminary result)   Collection Time: 04/23/19 12:30 AM   Specimen: BLOOD  Result Value Ref Range Status   Specimen Description [facility]LOOD LEFT [facility]  Final   Special Requests   Final    BOTTLES DRAWN AEROBIC AND ANAEROBIC Blood Culture adequate volume   Culture   Final    NO GROWTH 4 DAYS Performed at [facility], [address] [place], [place] [address]    Report Status PENDING  Incomplete  Blood Culture (routine x 2)     Status: None (Preliminary result)   Collection Time: 04/23/19  3:35 AM   Specimen: BLOOD  Result Value Ref Range Status   Specimen Description BLOOD RIGHT HAND  Final   Special Requests   Final    BOTTLES DRAWN AEROBIC AND ANAEROBIC Blood Culture adequate volume   Culture   Final    NO GROWTH 4 DAYS Performed at [facility], [address] [place], [place] [address]    Report Status PENDING  Incomplete  Urine culture     Status: None   Collection Time: 04/23/19  4:21 AM   Specimen: Urine, Clean  Catch  Result Value Ref Range Status   Specimen Description   Final    URINE, CLEAN CATCH Performed at [facility], [address] [place], [place] [address]    Special Requests   Final    NONE Performed at [facility], [address] [place], [place] [address]  Culture   Final    NO GROWTH Performed at [facility], [address], [place], [place] [address]    Report Status 04/24/2019 FINAL  Final  SARS CORONAVIRUS 2 (TAT 6-24 HRS) Nasopharyngeal Nasopharyngeal Swab     Status: None   Collection Time: 04/23/19  5:30 AM   Specimen: Nasopharyngeal Swab  Result Value Ref Range Status   SARS Coronavirus 2 NEGATIVE NEGATIVE Final    Comment: (NOTE) SARS-CoV-2 target nucleic acids are NOT DETECTED. The SARS-CoV-2 RNA is generally detectable in upper and lower respiratory specimens during the acute phase of infection. Negative results do not preclude SARS-CoV-2 infection, do not rule out co-infections with other pathogens, and should not be used as the sole basis for treatment or other patient management decisions. Negative results must be combined with clinical observations, patient history, and epidemiological information. The expected result is Negative. Fact Sheet for Patients: HairSlick.no Fact Sheet for Healthcare Providers: quierodirigir.com This test is not yet approved or cleared by the [place] FDA and  has been authorized for detection and/or diagnosis of SARS-CoV-2 by FDA under an Emergency Use Authorization (EUA). This EUA will remain  in effect (meaning this test can be used) for the duration of the COVID-19 declaration under Section 56 4(b)(1) of the Act, 21 U.S.C. section 360bbb-3(b)(1), unless the authorization is terminated or revoked sooner. Performed at [facility], [address], [place], [place] [address]   MRSA PCR Screening     Status: None   Collection  Time: 04/23/19  9:29 AM   Specimen: Nasopharyngeal  Result Value Ref Range Status   MRSA by PCR NEGATIVE NEGATIVE Final    Comment:        The GeneXpert MRSA Assay (FDA approved for NASAL specimens only), is one component of a comprehensive MRSA colonization surveillance program. It is not intended to diagnose MRSA infection nor to guide or monitor treatment for MRSA infections. Performed at [facility], [address] [place], [place] [address]     Procedures and diagnostic studies:  DG Chest [facility] 1 View  Result Date: 04/26/2019 CLINICAL DATA:  Wheezing. EXAM: PORTABLE CHEST 1 VIEW COMPARISON:  04/23/2019 FINDINGS: Cardiac silhouette mildly enlarged.  No mediastinal or hilar masses. There is vascular congestion, interstitial thickening and mild hazy central lung opacity, findings consistent with pulmonary edema. Findings have increased compared to the previous exam. No convincing pneumonia. Left lung base not fully imaged on this exam. No convincing pleural effusion. No pneumothorax. Skeletal structures are grossly intact. IMPRESSION: 1. Worsened lung aeration compared to the most recent prior study. Findings consistent with congestive heart failure with pulmonary edema. Electronically Signed   By: Amie Portland M.D.   On: 04/26/2019 15:09    Medications:   . atorvastatin  40 mg Oral QHS  . buprenorphine-naloxone  1 tablet Sublingual Daily  . chlordiazePOXIDE  5 mg Oral TID  . Chlorhexidine Gluconate Cloth  6 each Topical Daily  . enoxaparin (LOVENOX) injection  40 mg Subcutaneous Q24H  . finasteride  5 mg Oral Daily  . folic acid  1 mg Oral Daily  . mouth rinse  15 mL Mouth Rinse BID  . multivitamin with minerals  1 tablet Oral Daily  . pantoprazole  40 mg Oral Daily  . tamsulosin  0.8 mg Oral Daily  . thiamine  100 mg Oral Daily   Or  . thiamine  100 mg Intravenous Daily   Continuous Infusions: . dexmedetomidine (PRECEDEX) IV infusion  0.4 mcg/kg/hr (04/27/19  1032)     LOS: 4 days   Kymberlyn Eckford  [facility]   *Please refer to [email or web address], password [email or web address] to get updated schedule on who will round on this patient, as hospitalists switch teams weekly. If 7PM-7AM, please contact night-coverage at [email or web address], password [email or web address] for any overnight needs.  04/27/2019, 10:33 AM

## 2019-04-27 NOTE — Progress Notes (Signed)
Patient sedated from Precedex. Precedex dose decreased to .6. BiPAP removed and placed on 4 L nasal cannula.Respirations regular.

## 2019-04-28 DIAGNOSIS — R0789 Other chest pain: Secondary | ICD-10-CM

## 2019-04-28 DIAGNOSIS — N179 Acute kidney failure, unspecified: Secondary | ICD-10-CM

## 2019-04-28 DIAGNOSIS — I5021 Acute systolic (congestive) heart failure: Secondary | ICD-10-CM

## 2019-04-28 LAB — BASIC METABOLIC PANEL
Anion gap: 13 (ref 5–15)
BUN: 45 mg/dL — ABNORMAL HIGH (ref 8–23)
CO2: 26 mmol/L (ref 22–32)
Calcium: 8.9 mg/dL (ref 8.9–10.3)
Chloride: 107 mmol/L (ref 98–111)
Creatinine, Ser: 1.42 mg/dL — ABNORMAL HIGH (ref 0.61–1.24)
GFR calc Af Amer: >60 mL/min (ref >60)
GFR calc non Af Amer: 52 mL/min — ABNORMAL LOW (ref >60)
Glucose, Bld: 111 mg/dL — ABNORMAL HIGH (ref 70–99)
Potassium: 4.1 mmol/L (ref 3.5–5.1)
Sodium: 146 mmol/L — ABNORMAL HIGH (ref 135–145)

## 2019-04-28 LAB — CBC
HCT: 41.5 % (ref 39.0–52.0)
Hemoglobin: 14.1 g/dL (ref 13.0–17.0)
MCH: 28.1 pg (ref 26.0–34.0)
MCHC: 34 g/dL (ref 30.0–36.0)
MCV: 82.8 fL (ref 80.0–100.0)
Platelets: 197 10*3/uL (ref 150–400)
RBC: 5.01 MIL/uL (ref 4.22–5.81)
RDW: 22.1 % — ABNORMAL HIGH (ref 11.5–15.5)
WBC: 14.3 10*3/uL — ABNORMAL HIGH (ref 4.0–10.5)
nRBC: 8.5 % — ABNORMAL HIGH (ref 0.0–0.2)

## 2019-04-28 LAB — CULTURE, BLOOD (ROUTINE X 2)
Culture: NO GROWTH
Culture: NO GROWTH
Special Requests: ADEQUATE
Special Requests: ADEQUATE

## 2019-04-28 LAB — LIPID PANEL
Cholesterol: 156 mg/dL (ref 0–200)
HDL: 58 mg/dL (ref >40)
LDL Cholesterol: 75 mg/dL (ref 0–99)
Total CHOL/HDL Ratio: 2.7 RATIO
Triglycerides: 115 mg/dL (ref <150)
VLDL: 23 mg/dL (ref 0–40)

## 2019-04-28 LAB — GLUCOSE, CAPILLARY
Glucose-Capillary: 104 mg/dL — ABNORMAL HIGH (ref 70–99)
Glucose-Capillary: 151 mg/dL — ABNORMAL HIGH (ref 70–99)
Glucose-Capillary: 120 mg/dL — ABNORMAL HIGH (ref 70–99)

## 2019-04-28 LAB — TROPONIN I (HIGH SENSITIVITY): Troponin I (High Sensitivity): 295 ng/L (ref <18)

## 2019-04-28 LAB — MAGNESIUM: Magnesium: 1.7 mg/dL (ref 1.7–2.4)

## 2019-04-28 LAB — PHOSPHORUS: Phosphorus: 3.3 mg/dL (ref 2.5–4.6)

## 2019-04-28 LAB — BRAIN NATRIURETIC PEPTIDE: B Natriuretic Peptide: 2209 pg/mL — ABNORMAL HIGH (ref 0.0–100.0)

## 2019-04-28 LAB — TSH: TSH: 1.831 u[IU]/mL (ref 0.350–4.500)

## 2019-04-28 MED ORDER — CARVEDILOL 6.25 MG PO TABS: 6.2500 mg | ORAL_TABLET | Freq: Two times a day (BID) | ORAL | Status: DC

## 2019-04-28 MED ORDER — METOPROLOL TARTRATE 5 MG/5ML IV SOLN
5.0000 mg | Freq: Once | INTRAVENOUS | Status: AC
  Administered 2019-04-28: 5 mg via INTRAVENOUS
  Filled 2019-04-28: qty 5

## 2019-04-28 MED ORDER — METOPROLOL TARTRATE 5 MG/5ML IV SOLN
2.5000 mg | Freq: Three times a day (TID) | INTRAVENOUS | Status: AC
  Administered 2019-04-28 – 2019-04-29 (×2): 2.5 mg via INTRAVENOUS
  Filled 2019-04-28 (×2): qty 5

## 2019-04-28 NOTE — Progress Notes (Signed)
1600 Patient still pale and not interactive after complete bath beard trim and hair wash. Dr. Mortimer Fries notified. Dr. Saunders Revel called to assessed. 43 Dr. Saunders Revel also notified of consistent S.Tac after 2 doses of Metoprolol and 1724 run of V.Tac. Lab work done.

## 2019-04-28 NOTE — Consult Note (Signed)
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 contact info under     Signed, Arvil Chaco, PA-C  04/28/2019 3:16 PM

## 2019-04-28 NOTE — Progress Notes (Addendum)
Progress Note    Joshua Petersen  RUE:[id number] DOB: 1956/03/09  DOA: 04/23/2019 PCP: Dione Housekeeper, MD        Assessment/Plan:   Principal Problem:   Drug overdose Active Problems:   Alcohol withdrawal syndrome with complication, with unspecified complication (HCC)   Acute respiratory failure with hypoxia and hypercapnia (HCC)   AKI (acute kidney injury) (HCC)   Acute systolic CHF (congestive heart failure) (HCC)   Body mass index is 42.37 kg/m.   Accidental drug overdose: Resolved. S/p Narcan infusion.  Acute hypoxemic and hypercapnic respiratory failure: He is tolerating 3 L/min oxygen via nasal cannula  Acute toxic metabolic encephalopathy: Improving.  Continue supportive care  Acute kidney injury: Improving but creatinine is not back to baseline.  Creatinine was 0.81 on 04/17/2019.  Continue to monitor creatinine.  No hydronephrosis or obstruction noted on renal ultrasound.    Acute systolic heart failure fluid overload/acute pulmonary edema/sinus tachycardia: 2D echo on 04/27/2019 showed EF of 25 to 30%.  He was given IV metoprolol today to control his heart rate because he refused to swallow his pills including carvedilol.  Consulted cardiologist.  Ordered VQ scan to rule out PE though less likely.  Elevated troponin: Follow-up with cardiologist.  BPH with acute urinary retention: Foley catheter in place.  Continue finasteride and Flomax as able.  Hypokalemia: Improved  Hyponatremia: Improved but now mildly hypernatremic (sodium is slightly above normal).  Continue to monitor.  Alcohol abuse with alcohol withdrawal syndrome: He is off of Precedex drip.  Continue CIWA protocol   Hypertension: Lisinopril and HCTZ have been held.  Debility: PT and OT evaluation when he is alert enough to participate in therapy.  Social worker to assist with disposition.      Family Communication/Anticipated D/C date and plan/Code Status   DVT prophylaxis:  Lovenox Code Status: Full code Family Communication: Plan discussed with his son, Joshua Petersen, over the phone. disposition Plan: Possible discharge to skilled nursing facility in 3 to 4 days     Subjective:   Patient is still sedated on Precedex drip.  He is unable to provide any history.   Objective:    Vitals:   04/28/19 1200 04/28/19 1300 04/28/19 1400 04/28/19 1600  BP: (!) 138/102  (!) 139/96 138/90  Pulse:  (!) 106 (!) 124   Resp: (!) 22 (!) 22 (!) 24   Temp: 98.4 F (36.9 C)   98.6 F (37 C)  TempSrc:      SpO2:  94% 99%   Weight:      Height:        Intake/Output Summary (Last 24 hours) at 04/28/2019 1756 Last data filed at 04/28/2019 1412 Gross per 24 hour  Intake 493.53 ml  Output 950 ml  Net -456.47 ml   Filed Weights   04/22/19 2216 04/23/19 0922  Weight: 122.5 kg 126.4 kg    Exam:  GEN: NAD SKIN: No rash EYES: PERRLA ENT: MMM CV: RRR PULM: CTA B ABD: soft, obese, NT, +BS CNS: Drowsy but arousable, non focal EXT: Trace bilateral leg edema, no tenderness GU: Foley catheter draining amber urine     Data Reviewed:   I have personally reviewed following labs and imaging studies:  Labs: Labs show the following:   Basic Metabolic Panel: Recent Labs  Lab 04/23/19 0130 04/24/19 0642 04/25/19 0640 04/26/19 1240 04/27/19 0928 04/28/19 0502  NA 128* 132* 136 139 143 146*  K 2.8* 3.2* 3.8 4.6 4.4 4.1  CL 81* 94* 101 107 107 107  CO2 24 25 23  19* 20* 26  GLUCOSE 236* 102* 118* 158* 146* 111*  BUN 40* 30* 30* 34* 41* 45*  CREATININE 4.12* 2.52* 2.20* 1.67* 1.71* 1.42*  CALCIUM 8.9 8.2* 8.6* 8.5* 8.9 8.9  MG 2.4  --  1.9  --   --  1.7  PHOS  --   --  2.9  --   --  3.3   GFR Estimated Creatinine Clearance: 68.1 mL/min (A) (by C-G formula based on SCr of 1.42 mg/dL (H)). Liver Function Tests: Recent Labs  Lab 04/23/19 0130 04/24/19 0642  AST 137* 153*  ALT 51* 49*  ALKPHOS 75 56  BILITOT 1.1 1.2  PROT 8.2* 6.5  ALBUMIN 4.1 3.2*    Recent Labs  Lab 04/23/19 0130  LIPASE 49   Recent Labs  Lab 04/23/19 0402  AMMONIA 21   Coagulation profile Recent Labs  Lab 04/23/19 0130  INR 1.0    CBC: Recent Labs  Lab 04/23/19 0130 04/24/19 0642 04/25/19 0640 04/27/19 0928 04/28/19 0502  WBC 10.0 8.9 9.4 13.7* 14.3*  NEUTROABS 8.8*  --   --   --   --   HGB 13.9 12.0* 13.0 15.4 14.1  HCT 41.9 36.5* 40.5 46.6 41.5  MCV 84.8 86.5 88.2 85.0 82.8  PLT 152 122* 136* 166 197   Cardiac Enzymes: Recent Labs  Lab 04/23/19 0130  CKTOTAL 105   BNP (last 3 results) No results for input(s): PROBNP in the last 8760 hours. CBG: Recent Labs  Lab 04/23/19 0125 04/23/19 0937 04/28/19 0759 04/28/19 1206 04/28/19 1633  GLUCAP 232* 85 104* 151* 120*   D-Dimer: No results for input(s): DDIMER in the last 72 hours. Hgb A1c: No results for input(s): HGBA1C in the last 72 hours. Lipid Profile: No results for input(s): CHOL, HDL, LDLCALC, TRIG, CHOLHDL, LDLDIRECT in the last 72 hours. Thyroid function studies: Recent Labs    04/28/19 1555  TSH 1.831   Anemia work up: No results for input(s): VITAMINB12, FOLATE, FERRITIN, TIBC, IRON, RETICCTPCT in the last 72 hours. Sepsis Labs: Recent Labs  Lab 04/23/19 0130 04/23/19 0335 04/23/19 1154 04/24/19 0642 04/25/19 0640 04/27/19 0928 04/28/19 0502  WBC 10.0  --   --  8.9 9.4 13.7* 14.3*  LATICACIDVEN 5.1* 2.2* 1.9  --   --   --   --     Microbiology Recent Results (from the past 240 hour(s))  Blood Culture (routine x 2)     Status: None   Collection Time: 04/23/19 12:30 AM   Specimen: BLOOD  Result Value Ref Range Status   Specimen Description BLOOD LEFT [facility]  Final   Special Requests   Final    BOTTLES DRAWN AEROBIC AND ANAEROBIC Blood Culture adequate volume   Culture   Final    NO GROWTH 5 DAYS Performed at [facility], [address], [place], [place]    Report Status 04/28/2019 FINAL  Final  Blood Culture (routine x 2)      Status: None   Collection Time: 04/23/19  3:35 AM   Specimen: BLOOD  Result Value Ref Range Status   Specimen Description BLOOD RIGHT HAND  Final   Special Requests   Final    BOTTLES DRAWN AEROBIC AND ANAEROBIC Blood Culture adequate volume   Culture   Final    NO GROWTH 5 DAYS Performed at [facility], [address] [place], [place]  Report Status 04/28/2019 FINAL  Final  Urine culture     Status: None   Collection Time: 04/23/19  4:21 AM   Specimen: Urine, Clean Catch  Result Value Ref Range Status   Specimen Description   Final    URINE, CLEAN CATCH Performed at [facility], [address], [place], [place] [address]    Special Requests   Final    NONE Performed at [facility], [address] [place], [place] [address]    Culture   Final    NO GROWTH Performed at [facility], [address], [place], [place] [address]    Report Status 04/24/2019 FINAL  Final  SARS CORONAVIRUS 2 (TAT 6-24 HRS) Nasopharyngeal Nasopharyngeal Swab     Status: None   Collection Time: 04/23/19  5:30 AM   Specimen: Nasopharyngeal Swab  Result Value Ref Range Status   SARS Coronavirus 2 NEGATIVE NEGATIVE Final    Comment: (NOTE) SARS-CoV-2 target nucleic acids are NOT DETECTED. The SARS-CoV-2 RNA is generally detectable in upper and lower respiratory specimens during the acute phase of infection. Negative results do not preclude SARS-CoV-2 infection, do not rule out co-infections with other pathogens, and should not be used as the sole basis for treatment or other patient management decisions. Negative results must be combined with clinical observations, patient history, and epidemiological information. The expected result is Negative. Fact Sheet for Patients: HairSlick.no Fact Sheet for Healthcare Providers: quierodirigir.com This test is not yet approved or cleared by the [place] FDA and  has been authorized for detection and/or diagnosis of SARS-CoV-2 by FDA under an Emergency Use Authorization (EUA). This EUA will remain  in effect (meaning this test can be used) for the duration of the COVID-19 declaration under Section 56 4(b)(1) of the Act, 21 U.S.C. section 360bbb-3(b)(1), unless the authorization is terminated or revoked sooner. Performed at [facility], [address], [place], [place] [address]   MRSA PCR Screening     Status: None   Collection Time: 04/23/19  9:29 AM   Specimen: Nasopharyngeal  Result Value Ref Range Status   MRSA by PCR NEGATIVE NEGATIVE Final    Comment:        The GeneXpert MRSA Assay (FDA approved for NASAL specimens only), is one component of a comprehensive MRSA colonization surveillance program. It is not intended to diagnose MRSA infection nor to guide or monitor treatment for MRSA infections. Performed at [facility], [address] [place], [place] [address]     Procedures and diagnostic studies:  US RENAL  Result Date: 05-06-19 CLINICAL DATA:  Acute kidney injury. EXAM: RENAL / URINARY TRACT ULTRASOUND COMPLETE COMPARISON:  CT abdomen and pelvis 07/04/2016. FINDINGS: Right Kidney: Renal measurements: 14.7 x 6.0 x 5.8 cm = volume: 266.0 mL . Echogenicity within normal limits. No mass or hydronephrosis visualized. Left Kidney: Renal measurements: 13.2 x 6.9 x 6.1 cm = volume: 289.2 mL. Echogenicity within normal limits. No worrisome mass or hydronephrosis visualized. An echogenic focus in the cortex of the midpole measuring 1 cm craniocaudal could be a nonshadowing stone or small angiomyolipoma. Bladder: Decompressed with a Foley catheter in place. Other: None. IMPRESSION: Negative for hydronephrosis. No acute finding or abnormality to explain acute kidney injury. Electronically Signed   By: Drusilla Kanner M.D.   On: 2019/05/06 11:33   ECHOCARDIOGRAM COMPLETE  Result Date: 2019/05/06    ECHOCARDIOGRAM REPORT   Patient Name:   Joshua Petersen Date of  Exam: 04/27/2019 Medical Rec #:  [id number]    Height:       68.0 in Accession #:    [id number]   Weight:       278.7 lb Date of Birth:  [birth year]    BSA:          2.35 m Patient Age:    14 years     BP:           154/110 mmHg Patient Gender: M            HR:           74 bpm. Exam Location:  [facility] Procedure: 2D Echo, Cardiac Doppler, Color Doppler and Intracardiac            Opacification Agent Indications:     I50.9 Congestive Heart Failure  History:         Patient has no prior history of Echocardiogram examinations.                  Risk Factors:Hypertension and HCL. Hepatitis.  Sonographer:     Charmayne Sheer RDCS (AE) Referring Phys:  [place] Diagnosing Phys: Kathlyn Sacramento MD  Sonographer Comments: Technically difficult study due to poor echo windows. Image acquisition challenging due to uncooperative patient and Image acquisition challenging due to respiratory motion. IMPRESSIONS  1. Very suboptiaml study even with contrast use. Left ventricular ejection fraction, by visual estimation, is 25 to 30%. The left ventricle has severely decreased function. There is no left ventricular hypertrophy.  2. Definity contrast agent was given IV to delineate the left ventricular endocardial borders.  3. Left ventricular diastolic parameters are indeterminate.  4. Mildly dilated left ventricular internal cavity size.  5. Global right ventricle has normal systolic function.The right ventricular size is normal. No increase in right ventricular wall thickness.  6. Left atrial size was mildly dilated.  7. Right atrial size was normal.  8. The mitral valve is normal in structure. No evidence of mitral valve regurgitation. No evidence of mitral stenosis.  9. The tricuspid valve is normal in structure. Tricuspid valve regurgitation is not demonstrated. 10. The aortic valve is normal in structure. Aortic valve regurgitation is not visualized. Mild aortic valve  sclerosis without stenosis. 11. The pulmonic valve was normal in structure. Pulmonic valve regurgitation is not visualized. 12. TR signal is inadequate for assessing pulmonary artery systolic pressure. 13. The inferior vena cava is normal in size with greater than 50% respiratory variability, suggesting right atrial pressure of 3 mmHg. FINDINGS  Left Ventricle: Left ventricular ejection fraction, by visual estimation, is 25 to 30%. The left ventricle has severely decreased function. Definity contrast agent was given IV to delineate the left ventricular endocardial borders. The left ventricle is  not well visualized. The left ventricular internal cavity size was mildly dilated left ventricle. There is no left ventricular hypertrophy. Left ventricular diastolic parameters are indeterminate. Normal left atrial pressure. Right Ventricle: The right ventricular size is normal. No increase in right ventricular wall thickness. Global RV systolic function is has normal systolic function. Left Atrium: Left atrial size was mildly dilated. Right Atrium: Right atrial size was normal in size Pericardium: There is no evidence of pericardial effusion. Mitral Valve: The mitral valve is normal in structure. No evidence of mitral valve regurgitation. No evidence of mitral valve stenosis by observation. MV peak gradient, 2.7 mmHg. Tricuspid Valve: The tricuspid valve is normal in structure. Tricuspid valve regurgitation is not demonstrated. Aortic Valve: The aortic  valve is normal in structure. Aortic valve regurgitation is not visualized. Mild aortic valve sclerosis is present, with no evidence of aortic valve stenosis. Aortic valve mean gradient measures 1.0 mmHg. Aortic valve peak gradient measures 2.3 mmHg. Aortic valve area, by VTI measures 2.72 cm. Pulmonic Valve: The pulmonic valve was normal in structure. Pulmonic valve regurgitation is not visualized. Pulmonic regurgitation is not visualized. Aorta: The aortic root, ascending  aorta and aortic arch are all structurally normal, with no evidence of dilitation or obstruction. Venous: The inferior vena cava is normal in size with greater than 50% respiratory variability, suggesting right atrial pressure of 3 mmHg. IAS/Shunts: No atrial level shunt detected by color flow Doppler. There is no evidence of a patent foramen ovale. No ventricular septal defect is seen or detected. There is no evidence of an atrial septal defect.  LEFT VENTRICLE PLAX 2D LVIDd:         5.88 cm       Diastology LVIDs:         5.52 cm       LV e' medial:   4.03 cm/s LV PW:         0.98 cm       LV E/e' medial: 15.4 LV IVS:        0.72 cm LVOT diam:     2.30 cm LV SV:         23 ml LV SV Index:   9.19 LVOT Area:     4.15 cm  LV Volumes (MOD) LV area d, A4C:    37.30 cm LV area s, A4C:    25.50 cm LV major d, A4C:   8.95 cm LV major s, A4C:   8.45 cm LV vol d, MOD A4C: 127.0 ml LV vol s, MOD A4C: 66.0 ml LV SV MOD A4C:     127.0 ml LEFT ATRIUM             Index LA diam:        3.40 cm 1.44 cm/m LA Vol (A2C):   59.1 ml 25.12 ml/m LA Vol (A4C):   59.8 ml 25.41 ml/m LA Biplane Vol: 59.1 ml 25.12 ml/m  AORTIC VALVE                   PULMONIC VALVE AV Area (Vmax):    3.04 cm    PV Vmax:       0.68 m/s AV Area (Vmean):   2.73 cm    PV Vmean:      45.100 cm/s AV Area (VTI):     2.72 cm    PV VTI:        0.090 m AV Vmax:           75.80 cm/s  PV Peak grad:  1.9 mmHg AV Vmean:          53.500 cm/s PV Mean grad:  1.0 mmHg AV VTI:            0.122 m AV Peak Grad:      2.3 mmHg AV Mean Grad:      1.0 mmHg LVOT Vmax:         55.40 cm/s LVOT Vmean:        35.200 cm/s LVOT VTI:          0.080 m LVOT/AV VTI ratio: 0.65  AORTA Ao Root diam: 3.80 cm MITRAL VALVE MV Area (PHT): 3.92 cm  SHUNTS MV Peak grad:  2.7 mmHg             Systemic VTI:  0.08 m MV Mean grad:  1.0 mmHg             Systemic Diam: 2.30 cm MV Vmax:       0.82 m/s MV Vmean:      45.1 cm/s MV VTI:        0.19 m MV PHT:        56.11 msec MV Decel Time:  194 msec MV E velocity: 62.10 cm/s 103 cm/s MV A velocity: 33.00 cm/s 70.3 cm/s MV E/A ratio:  1.88       1.5  Lorine BearsMuhammad Arida MD Electronically signed by Lorine BearsMuhammad Arida MD Signature Date/Time: 04/27/2019/1:41:16 PM    Final     Medications:   . atorvastatin  40 mg Oral QHS  . buprenorphine-naloxone  1 tablet Sublingual Daily  . [START ON 04/29/2019] carvedilol  6.25 mg Oral BID WC  . chlordiazePOXIDE  5 mg Oral TID  . Chlorhexidine Gluconate Cloth  6 each Topical Daily  . enoxaparin (LOVENOX) injection  40 mg Subcutaneous Q24H  . finasteride  5 mg Oral Daily  . folic acid  1 mg Oral Daily  . mouth rinse  15 mL Mouth Rinse BID  . metoprolol tartrate  2.5 mg Intravenous Q8H  . multivitamin with minerals  1 tablet Oral Daily  . pantoprazole  40 mg Oral Daily  . tamsulosin  0.8 mg Oral Daily  . thiamine  100 mg Oral Daily   Continuous Infusions:    LOS: 5 days   Kyliana Standen  [facility]   *Please refer to [email or web address], password [email or web address] to get updated schedule on who will round on this patient, as hospitalists switch teams weekly. If 7PM-7AM, please contact night-coverage at [email or web address], password [email or web address] for any overnight needs.  04/28/2019, 5:56 PM

## 2019-04-29 ENCOUNTER — Inpatient Hospital Stay: Payer: Medicaid Other

## 2019-04-29 DIAGNOSIS — I10 Essential (primary) hypertension: Secondary | ICD-10-CM

## 2019-04-29 DIAGNOSIS — Z7189 Other specified counseling: Secondary | ICD-10-CM

## 2019-04-29 LAB — BASIC METABOLIC PANEL
Anion gap: 16 — ABNORMAL HIGH (ref 5–15)
BUN: 49 mg/dL — ABNORMAL HIGH (ref 8–23)
CO2: 23 mmol/L (ref 22–32)
Calcium: 9.1 mg/dL (ref 8.9–10.3)
Chloride: 111 mmol/L (ref 98–111)
Creatinine, Ser: 1.19 mg/dL (ref 0.61–1.24)
GFR calc Af Amer: >60 mL/min (ref >60)
GFR calc non Af Amer: >60 mL/min (ref >60)
Glucose, Bld: 161 mg/dL — ABNORMAL HIGH (ref 70–99)
Potassium: 3.8 mmol/L (ref 3.5–5.1)
Sodium: 150 mmol/L — ABNORMAL HIGH (ref 135–145)

## 2019-04-29 LAB — HEMOGLOBIN A1C
Hgb A1c MFr Bld: 6.8 % — ABNORMAL HIGH (ref 4.8–5.6)
Mean Plasma Glucose: 148.46 mg/dL

## 2019-04-29 MED ORDER — FOLIC ACID 5 MG/ML IJ SOLN
1.0000 mg | Freq: Every day | INTRAMUSCULAR | Status: AC
  Administered 2019-04-29 – 2019-05-01 (×3): 1 mg via INTRAVENOUS
  Filled 2019-04-29 (×3): qty 0.2

## 2019-04-29 MED ORDER — MORPHINE SULFATE (PF) 2 MG/ML IV SOLN
INTRAVENOUS | Status: AC
  Administered 2019-04-29: 18:00:00 2 mg via INTRAVENOUS
  Filled 2019-04-29: qty 1

## 2019-04-29 MED ORDER — TECHNETIUM TO 99M ALBUMIN AGGREGATED
4.0000 | Freq: Once | INTRAVENOUS | Status: AC | PRN
  Administered 2019-04-29: 4.6 via INTRAVENOUS

## 2019-04-29 MED ORDER — THIAMINE HCL 100 MG/ML IJ SOLN
100.0000 mg | Freq: Every day | INTRAMUSCULAR | Status: DC
  Administered 2019-04-29 – 2019-04-30 (×2): 100 mg via INTRAVENOUS
  Filled 2019-04-29 (×2): qty 2

## 2019-04-29 MED ORDER — ENALAPRILAT 1.25 MG/ML IV SOLN
0.6250 mg | Freq: Every day | INTRAVENOUS | Status: DC
  Administered 2019-04-29 – 2019-04-30 (×2): 0.625 mg via INTRAVENOUS
  Filled 2019-04-29 (×3): qty 0.5

## 2019-04-29 MED ORDER — LISINOPRIL 5 MG PO TABS: 5.0000 mg | ORAL_TABLET | Freq: Every day | ORAL | Status: DC

## 2019-04-29 MED ORDER — TECHNETIUM TC 99M DIETHYLENETRIAME-PENTAACETIC ACID
30.0000 | Freq: Once | INTRAVENOUS | Status: AC | PRN
  Administered 2019-04-29: 35.518 via INTRAVENOUS

## 2019-04-29 MED ORDER — METOPROLOL TARTRATE 5 MG/5ML IV SOLN
2.5000 mg | Freq: Three times a day (TID) | INTRAVENOUS | Status: AC
  Administered 2019-04-29 – 2019-04-30 (×3): 2.5 mg via INTRAVENOUS
  Filled 2019-04-29 (×3): qty 5

## 2019-04-29 MED ORDER — FUROSEMIDE 10 MG/ML IJ SOLN
40.0000 mg | Freq: Two times a day (BID) | INTRAMUSCULAR | Status: DC
  Administered 2019-04-29 – 2019-04-30 (×2): 40 mg via INTRAVENOUS
  Filled 2019-04-29 (×2): qty 4

## 2019-04-29 MED ORDER — FUROSEMIDE 10 MG/ML IJ SOLN
60.0000 mg | Freq: Once | INTRAMUSCULAR | Status: AC
  Administered 2019-04-29: 11:00:00 60 mg via INTRAVENOUS
  Filled 2019-04-29: qty 6

## 2019-04-29 MED ORDER — DEXTROSE 5 % IV SOLN: INTRAVENOUS | Status: DC

## 2019-04-29 MED ORDER — MORPHINE SULFATE (PF) 2 MG/ML IV SOLN: 1.0000 mg | INTRAVENOUS | Status: DC | PRN

## 2019-04-29 NOTE — Progress Notes (Addendum)
[place] [facility] [place] NAME: Joshua Petersen    MR#:  [id number]  DATE OF BIRTH:  18-Dec-1955  SUBJECTIVE:  patient keeps his eyes closed the whole time. Does not respond. Per RN not communicating. Refuses medications. Not eaten in six days REVIEW OF SYSTEMS:   Review of Systems  Unable to perform ROS: Patient nonverbal   Tolerating Diet:refuses Tolerating PT:   DRUG ALLERGIES:  No Known Allergies  VITALS:  Blood pressure (!) 141/108, pulse (!) 114, temperature 98.9 F (37.2 C), temperature source Axillary, resp. rate 12, height 5\' 8"  (1.727 m), weight 126.4 kg, SpO2 100 %.  PHYSICAL EXAMINATION:   Physical Examlimited  GENERAL:  63 y.o.-year-old patient lying in the bed with no acute distress. obese EYES: Pupils equal, round, reactive to light and accommodation. No scleral icterus.  HEENT: Head atraumatic, normocephalic. Oropharynx and nasopharynx clear. Oral mucosa dry NECK:  Supple, no jugular venous distention. No thyroid enlargement, no tenderness.  LUNGS: Normal breath sounds bilaterally, no wheezing, rales, rhonchi. No use of accessory muscles of respiration.  CARDIOVASCULAR: S1, S2 normal. No murmurs, rubs, or gallops.  ABDOMEN: Soft, nontender, nondistended. Bowel sounds present. No organomegaly or mass.  EXTREMITIES: No cyanosis, clubbing or edema b/l.    NEUROLOGIC: moves spontaneously extremities. Unable to participate in exam PSYCHIATRIC:  patient means lethargic although opens eyes temporarily on verbal command does not communicate SKIN: exam RN  LABORATORY PANEL:  CBC Recent Labs  Lab 04/28/19 0502  WBC 14.3*  HGB 14.1  HCT 41.5  PLT 197    Chemistries  Recent Labs  Lab 04/24/19 0642 04/28/19 0502 04/29/19 0505  NA 132* 146* 150*  K 3.2* 4.1 3.8  CL 94* 107 111  CO2 25 26 23   GLUCOSE 102* 111* 161*  BUN 30* 45* 49*  CREATININE 2.52* 1.42* 1.19  CALCIUM 8.2* 8.9 9.1  MG  --  1.7  --   AST 153*  --   --    ALT 49*  --   --   ALKPHOS 56  --   --   BILITOT 1.2  --   --    Cardiac Enzymes No results for input(s): TROPONINI in the last 168 hours. RADIOLOGY:  NM Pulmonary Perf and Vent  Result Date: 04/29/2019 CLINICAL DATA:  Respiratory failure, requiring BiPAP at night, recently quit smoking, alcohol detox, history of accidental drug overdose, CHF, acute kidney injury, hypertension, former smoker EXAM: NUCLEAR MEDICINE VENTILATION - PERFUSION LUNG SCAN TECHNIQUE: Ventilation images were obtained in multiple projections using inhaled aerosol Tc-47m DTPA. Perfusion images were obtained in multiple projections after intravenous injection of Tc-68m MAA. RADIOPHARMACEUTICALS:  35.52 mCi of Tc-51m DTPA aerosol inhalation and 4.6 mCi Tc57m MAA IV COMPARISON:  None Correlation: FINDINGS: Ventilation: Cardiomegaly. Airway deposition of aerosol centrally. Swallowed aerosol within stomach. Diminished ventilation peripherally in the RIGHT middle lobe laterally and inferiorly. Additional diminished ventilation in medial LEFT lower lobe and minimally at [place] lower lobe. Perfusion: Cardiomegaly. Matching areas of diminished perfusion in the RIGHT middle lobe and lateral/basilar RIGHT lower lobe. Better perfusion and ventilation in LEFT lower lobe. Chest radiograph: Cardiomegaly with pulmonary vascular congestion. Minimal RIGHT basilar atelectasis. IMPRESSION: Matching areas of diminished ventilation and perfusion in the RIGHT middle lobe and RIGHT lower lobe with better perfusion than ventilation in medial LEFT lower lobe. Findings represent a low probability for pulmonary embolism. Electronically Signed   By: Lavonia Dana M.D.   On: 04/29/2019 14:32  DG Chest Port 1 View  Result Date: 04/29/2019 CLINICAL DATA:  Abnormal perfusion lung scan. EXAM: PORTABLE CHEST 1 VIEW COMPARISON:  Nuclear medicine study same day. Radiography 04/26/2019 FINDINGS: Cardiomegaly. Pulmonary venous hypertension without  frank edema. No consolidation, collapse or effusion. IMPRESSION: Cardiomegaly and pulmonary venous hypertension. Electronically Signed   By: Paulina Fusi M.D.   On: 04/29/2019 14:13   ASSESSMENT AND PLAN:  Joshua Petersen is a 63 y.o. male with medical history significant of HTN, chronic low back pain, called EMS and was brought to the hospital for generalized body aches.  He was in the waiting room for a few hours and on intake he was found to be slumped over barely responsive.  *Acute hypoxemic and hypercapnic respiratory failure due to possible Narcotic OD - He is tolerating 3 L/min oxygen via nasal cannula wean down as tolerated  *Acute toxic metabolic encephalopathy: Improving.  Continue supportive care -he does not communicate. -Not on any sedating meds other than PRN Ativan for CIWA. Not sure with the CIWA of 20 is accurate?  *Acute kidney injury: Improving but creatinine is back to baseline.   -Creatinine was 0.81 on 04/17/2019.   -No hydronephrosis or obstruction noted on renal ultrasound.    *Acute systolic heart failure fluid overload/acute pulmonary edema/sinus tachycardia:  -2D echo on 04/27/2019 showed EF of 25 to 30%.   -cont IV metoprolol today to control his heart rate because he refused to swallow his pills including carvedilol.   -Consulted [facility] cardiologist.  -low probability of PE on V/Q scan  -IV Lasix 40 mg bid for tomorrow then assess daily need for Lasix  *Accidental drug overdose: Resolved. S/p Narcan infusion.  *BPH with acute urinary retention: Foley catheter in place.   -Continue finasteride and Flomax as able.  *Hypernatremia secondary to poor PO intake  -discussed with dietitian. Patient has not eaten in last six days. -He is denying to take his medication and oral diet.  *Alcohol abuse with alcohol withdrawal syndrome: He is off of Precedex drip.  Continue CIWA protocol -? Depression ? Failure to thrive  -palliative care to see patient. Overall  poor prognosis -psychiatry to see patient   *Hypertension:  -metoprolol IV and IV enalapril  *Debility: PT and OT evaluation when he is alert enough to participate in therapy. -  Child psychotherapist to assist with disposition. -Overall poor prognosis. Palliative care consultation placed-- plan to meet with patient's son tomorrow   Procedures:none Family communication : none consults : cardiology, psychiatry  Discharge Disposition :TBD CODE STATUS: FULL DVT Prophylaxis :lovenox  TOTAL TIME TAKING CARE OF THIS PATIENT: *30* minutes.  >50% time spent on counselling and coordination of care  POSSIBLE D/C IN **?* DAYS, DEPENDING ON CLINICAL CONDITION.  Note: This dictation was prepared with Dragon dictation along with smaller phrase technology. Any transcriptional errors that result from this process are unintentional.  Enedina Finner M.D on 04/29/2019 at 4:55 PM  Between 7am to 6pm - Pager - [phone]  After 6pm go to [email or web address]  [facility]   CC: Primary care physician; Dione Housekeeper, MDPatient ID: Joshua Petersen, male   DOB: June 12, 1955, 63 y.o.   MRN: [id number]

## 2019-04-29 NOTE — Plan of Care (Signed)
PMT note: Patient nonverbal. He opens his eyes briefly and closes them. He shakes his head to all questions asked, even those that should elicit a yes response. Spoke with son [name]. Will have e link meeting at 9:30-10:00 tomorrow morning.
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
## 2019-04-29 NOTE — Consult Note (Signed)
  63 year old patient with acute metabolic encephalopathy, depression, possible withdrawal.  Psychiatry consult was called due to patient's poor p.o. intake.  When evaluated patient was unable to open eyes or respond to writer's commands.  The nurse was called to bedside and with some further attempts patient was able to open his eyes and say hello however quickly drifted off back to sleep.  Multiple other attempts were made to wake or speak to the patient with no success.  Nurse also attempted to feed patient applesauce and patient was pulling his mouth away from the spoon.   Patient at this time is likely still in the midst of a delirious episode.  Unable to perform psychiatric evaluation at this time.  Psychiatry will continue to follow the patient and attempt to reassess when patient is more alert.

## 2019-04-29 NOTE — Progress Notes (Signed)
Progress Note  Patient Name: Joshua Petersen Date of Encounter: 04/29/2019  Primary Cardiologist: Dr. Fletcher Anon  Subjective   Patient extremely somnolent this morning.  Cannot follow my commands or answer my questions.  Sleeping and snoring loudly.  Inpatient Medications    Scheduled Meds: . atorvastatin  40 mg Oral QHS  . buprenorphine-naloxone  1 tablet Sublingual Daily  . carvedilol  6.25 mg Oral BID WC  . chlordiazePOXIDE  5 mg Oral TID  . Chlorhexidine Gluconate Cloth  6 each Topical Daily  . enoxaparin (LOVENOX) injection  40 mg Subcutaneous Q24H  . finasteride  5 mg Oral Daily  . folic acid  1 mg Oral Daily  . mouth rinse  15 mL Mouth Rinse BID  . multivitamin with minerals  1 tablet Oral Daily  . pantoprazole  40 mg Oral Daily  . tamsulosin  0.8 mg Oral Daily  . thiamine  100 mg Oral Daily   Continuous Infusions:  PRN Meds: albuterol, LORazepam **OR** LORazepam, traMADol   Vital Signs    Vitals:   04/29/19 0600 04/29/19 0800 04/29/19 0900 04/29/19 1000  BP: (!) 144/102 (!) 144/105 (!) 144/93 (!) 140/95  Pulse: (!) 124 (!) 120 (!) 126 (!) 122  Resp: 14 (!) 26 19 (!) 38  Temp:  99.1 F (37.3 C)    TempSrc:  Axillary    SpO2: 95% 99% 97% 92%  Weight:      Height:        Intake/Output Summary (Last 24 hours) at 04/29/2019 1212 Last data filed at 04/29/2019 0800 Gross per 24 hour  Intake 240 ml  Output 960 ml  Net -720 ml   Last 3 Weights 04/23/2019 04/22/2019 04/17/2019  Weight (lbs) 278 lb 10.6 oz 270 lb 273 lb 5.9 oz  Weight (kg) 126.4 kg 122.471 kg 124 kg      Telemetry    Sinus tachycardia, heart rate 120- Personally Reviewed  ECG    Not performed today  Physical Exam   GEN:  Somnolent.   Neck: No JVD Cardiac:  Tachycardic, no murmurs, rubs, or gallops.  Respiratory: Clear to auscultation bilaterally. GI: Soft, nontender, non-distended  MS: No edema; No deformity. Neuro:   Somnolent Psych: Unable to assess  Labs    High Sensitivity  Troponin:   Recent Labs  Lab 04/14/19 1449 04/17/19 1709 04/17/19 1917 04/23/19 0130 04/28/19 1555  TROPONINIHS 7 9 9  30* 295*      Chemistry Recent Labs  Lab 04/23/19 0130 04/24/19 0642 04/27/19 0928 04/28/19 0502 04/29/19 0505  NA 128* 132* 143 146* 150*  K 2.8* 3.2* 4.4 4.1 3.8  CL 81* 94* 107 107 111  CO2 24 25 20* 26 23  GLUCOSE 236* 102* 146* 111* 161*  BUN 40* 30* 41* 45* 49*  CREATININE 4.12* 2.52* 1.71* 1.42* 1.19  CALCIUM 8.9 8.2* 8.9 8.9 9.1  PROT 8.2* 6.5  --   --   --   ALBUMIN 4.1 3.2*  --   --   --   AST 137* 153*  --   --   --   ALT 51* 49*  --   --   --   ALKPHOS 75 56  --   --   --   BILITOT 1.1 1.2  --   --   --   GFRNONAA 14* 26* 41* 52* >60  GFRAA 17* 30* 48* >60 >60  ANIONGAP 23* 13 16* 13 16*     Hematology Recent Labs  Lab 04/25/19 0640 04/27/19 0928 04/28/19 0502  WBC 9.4 13.7* 14.3*  RBC 4.59 5.48 5.01  HGB 13.0 15.4 14.1  HCT 40.5 46.6 41.5  MCV 88.2 85.0 82.8  MCH 28.3 28.1 28.1  MCHC 32.1 33.0 34.0  RDW 20.7* 22.1* 22.1*  PLT 136* 166 197    BNP Recent Labs  Lab 04/28/19 1629  BNP 2,209.0*     DDimer No results for input(s): DDIMER in the last 168 hours.   Radiology    No results found.  Cardiac Studies   TTE 04/27/2019 1. Very suboptiaml study even with contrast use. Left ventricular ejection fraction, by visual estimation, is 25 to 30%. The left ventricle has severely decreased function. There is no left ventricular hypertrophy.  2. Definity contrast agent was given IV to delineate the left ventricular endocardial borders.  3. Left ventricular diastolic parameters are indeterminate.  4. Mildly dilated left ventricular internal cavity size.  5. Global right ventricle has normal systolic function.The right ventricular size is normal. No increase in right ventricular wall thickness.  6. Left atrial size was mildly dilated.  7. Right atrial size was normal.  8. The mitral valve is normal in structure. No evidence  of mitral valve regurgitation. No evidence of mitral stenosis.  9. The tricuspid valve is normal in structure. Tricuspid valve regurgitation is not demonstrated. 10. The aortic valve is normal in structure. Aortic valve regurgitation is not visualized. Mild aortic valve sclerosis without stenosis. 11. The pulmonic valve was normal in structure. Pulmonic valve regurgitation is not visualized. 12. TR signal is inadequate for assessing pulmonary artery systolic pressure. 13. The inferior vena cava is normal in size with greater than 50% respiratory variability, suggesting right atrial pressure of 3 mmHg  Pharmacologic stress test date 08/21/2018 Pharmacological myocardial perfusion imaging study with no significant  Ischemia On attenuation corrected images , there is a very small defect of mild intensity noted in the apical region on stress images, SPECT secondary to attenuation artifact Non-attenuation corrected images with significant inferior wall attenuation artifact consistent with diaphragmatic attenuation Normal wall motion, EF estimated at 59% No EKG changes concerning for ischemia at peak stress or in recovery. CT scan reviewed with at least mild coronary calcification, mild descending aorta atherosclerosis Low risk scan  Patient Profile     63 y.o. male with history of hypertension, hyperlipidemia, GERD, chronic pain who presents due to generalized body aches.  Found to be altered in the emergency room.  Also with hypercapnic respiratory failure.  Patient received Narcan in the ED was placed on BiPAP.  Hospital course has been marked with tachycardia.  Echocardiogram for work-up revealed severely reduced ejection fraction with EF of 25 to 30%.  Prior myocardial perfusion imaging stress test did not show any ischemia.  Cardiomyopathy thus deemed nonischemic.    Assessment & Plan   Patient very somnolent this morning.  Unable to follow commands lacks any questions.  Telemetry shows sinus  tachycardia with heart rate 124.  Upon chart review, there is documentation of patient stating drinking almost every day, I am not sure how long he has been drinking.  It is thus possible that his cardiomyopathy could be EtOH induced especially as last pharmacologic stress test did not reveal any evidence for ischemia.    1. cardiomyopathy, EF 25 to 30% Coreg 6.25 mg twice daily, start lisinopril 5 mg daily. Titrate beta-blocker and ACE inhibitor as blood pressure tolerates. -Sinus tachycardia is likely secondary to withdrawals.  2.  Hypertension -  Coreg and lisinopril as above -Titrate as needed  3.  Chronic pain, opioid use, possible EtOH withdrawal -Treating these will help with patient's sinus tachycardia -Management as per primary team  Signed, Debbe Odea, MD  04/29/2019, 12:12 PM

## 2019-04-30 ENCOUNTER — Inpatient Hospital Stay: Payer: Medicaid Other

## 2019-04-30 DIAGNOSIS — G9341 Metabolic encephalopathy: Secondary | ICD-10-CM

## 2019-04-30 DIAGNOSIS — R4182 Altered mental status, unspecified: Secondary | ICD-10-CM

## 2019-04-30 DIAGNOSIS — R0902 Hypoxemia: Secondary | ICD-10-CM

## 2019-04-30 DIAGNOSIS — Z515 Encounter for palliative care: Secondary | ICD-10-CM

## 2019-04-30 DIAGNOSIS — I420 Dilated cardiomyopathy: Secondary | ICD-10-CM

## 2019-04-30 DIAGNOSIS — F1110 Opioid abuse, uncomplicated: Secondary | ICD-10-CM

## 2019-04-30 DIAGNOSIS — Z7189 Other specified counseling: Secondary | ICD-10-CM

## 2019-04-30 LAB — BASIC METABOLIC PANEL
Anion gap: 10 (ref 5–15)
BUN: 54 mg/dL — ABNORMAL HIGH (ref 8–23)
CO2: 31 mmol/L (ref 22–32)
Calcium: 8.9 mg/dL (ref 8.9–10.3)
Chloride: 112 mmol/L — ABNORMAL HIGH (ref 98–111)
Creatinine, Ser: 1.35 mg/dL — ABNORMAL HIGH (ref 0.61–1.24)
GFR calc Af Amer: >60 mL/min (ref >60)
GFR calc non Af Amer: 55 mL/min — ABNORMAL LOW (ref >60)
Glucose, Bld: 116 mg/dL — ABNORMAL HIGH (ref 70–99)
Potassium: 3.6 mmol/L (ref 3.5–5.1)
Sodium: 153 mmol/L — ABNORMAL HIGH (ref 135–145)

## 2019-04-30 LAB — PHOSPHORUS: Phosphorus: 4.6 mg/dL (ref 2.5–4.6)

## 2019-04-30 LAB — AMMONIA: Ammonia: 38 umol/L — ABNORMAL HIGH (ref 9–35)

## 2019-04-30 MED ORDER — ENSURE ENLIVE PO LIQD
237.0000 mL | Freq: Two times a day (BID) | ORAL | Status: DC
  Administered 2019-05-01 – 2019-05-04 (×8): 237 mL via ORAL

## 2019-04-30 MED ORDER — METOPROLOL TARTRATE 5 MG/5ML IV SOLN
2.5000 mg | Freq: Three times a day (TID) | INTRAVENOUS | Status: DC
  Administered 2019-04-30 – 2019-05-01 (×3): 2.5 mg via INTRAVENOUS
  Filled 2019-04-30 (×3): qty 5

## 2019-04-30 MED ORDER — THIAMINE HCL 100 MG/ML IJ SOLN
500.0000 mg | Freq: Every day | INTRAVENOUS | Status: AC
  Administered 2019-04-30 – 2019-05-01 (×2): 500 mg via INTRAVENOUS
  Filled 2019-04-30 (×3): qty 5

## 2019-04-30 MED ORDER — DEXTROSE 5 % IV SOLN: INTRAVENOUS | Status: DC

## 2019-04-30 NOTE — Procedures (Signed)
Went to perform EEG.  Was informed that patient is going to MRI now.  Will attempt to do EEG tomorrow.

## 2019-04-30 NOTE — Progress Notes (Signed)
Initial Nutrition Assessment  RD working remotely.  DOCUMENTATION CODES:   Morbid obesity  INTERVENTION:  Provide Ensure Enlive po BID, each supplement provides 350 kcal and 20 grams of protein.   Provide Magic cup TID with meals, each supplement provides 290 kcal and 9 grams of protein.  Patient is refusing PO intake and medications. Recommend placement of small-bore NGT for initiation of tube feeds and also for provision of medications. Patient's son is deciding on goals of care and whether he would want NGT placed.  If tube feeds are initiated recommend: -Initiate Osmolite 1.5 Cal at 20 mL/hr and advance by 20 mL/hr ever 8 hours to goal rate of 60 mL/hr. Provide Pro-Stat 30 mL BID per tube. Goal regimen provides 2360 kcal, 120 grams of protein, 1094 mL H2O daily. -Provide minimum free water flush of 30 mL Q4hrs to maintain tube patency.  Patient will be at risk for refeeding syndrome if tube feeds are initiated. Recommend monitoring potassium, phosphorus, and magnesium daily and replacing as needed.  NUTRITION DIAGNOSIS:   Inadequate oral intake related to lethargy/confusion as evidenced by meal completion < 25%.  GOAL:   Patient will meet greater than or equal to 90% of their needs  MONITOR:   PO intake, Supplement acceptance, Labs, Weight trends, TF tolerance, I & O's  REASON FOR ASSESSMENT:   Rounds    ASSESSMENT:   63 year old male with PMHx of HTN, GERD, arthritis, hepatitis C, EtOH abuse admitted with acute hypoxemic and hypercapnic respiratory failure, acute toxic metabolic encephalopathy, AKI, acute systolic heart failure, accidental drug overdose, BPH, hypernatremia.   Discussed on rounds on 12/16 that patient is refusing PO intake and medications. Patient is unable to provide any history. Looked back in chart and it appears he has only had two documented meals this admission. He ate 100% of breakfast on 12/11 and 50% of lunch on 12/11. Every other meal is either  documented as 0% or not charted on. Palliative medicine has been consulted to discuss goals of care. Patient's son will be the decision maker and he would like a few more days to decide on NGT placement.  Weights in chart fluctuate between 122-131 kg so difficult to trend. He is now 126.4 kg (278.66 lbs).  Medications reviewed and include: folic acid 1 mg daily IV, MVI daily (unable to take), pantoprazole (unable to take), Flomax (unable to take), D5W at 50 mL/hr, thiamine 500 mg daily IV.  Labs reviewed: Sodium 153, Chloride 112, BUN 54, Creatinine 1.35.  NUTRITION - FOCUSED PHYSICAL EXAM:  Unable to complete at this time.  Diet Order:   Diet Order            Diet 2 gram sodium Room service appropriate? Yes; Fluid consistency: Thin  Diet effective now             EDUCATION NEEDS:   No education needs have been identified at this time  Skin:  Skin Assessment: Reviewed RN Assessment  Last BM:  04/28/2019 per chart  Height:   Ht Readings from Last 1 Encounters:  04/23/19 5\' 8"  (1.727 m)   Weight:   Wt Readings from Last 1 Encounters:  04/23/19 126.4 kg   Ideal Body Weight:  70 kg  BMI:  Body mass index is 42.37 kg/m.  Estimated Nutritional Needs:   Kcal:  2200-2400  Protein:  115-125 grams  Fluid:  1.8-2 L/day  Jacklynn Barnacle, MS, RD, LDN Office: [phone] Pager: [phone] After Hours/Weekend Pager: [phone]

## 2019-04-30 NOTE — Progress Notes (Signed)
Progress Note  Patient Name: Joshua Petersen Date of Encounter: 04/30/2019  Primary Cardiologist: Kirke Corin  Subjective   Lethargic. Minimally responsive.   Inpatient Medications    Scheduled Meds: . atorvastatin  40 mg Oral QHS  . buprenorphine-naloxone  1 tablet Sublingual Daily  . chlordiazePOXIDE  5 mg Oral TID  . Chlorhexidine Gluconate Cloth  6 each Topical Daily  . enalaprilat  0.625 mg Intravenous Daily  . enoxaparin (LOVENOX) injection  40 mg Subcutaneous Q24H  . finasteride  5 mg Oral Daily  . folic acid  1 mg Intravenous Daily  . furosemide  40 mg Intravenous BID  . mouth rinse  15 mL Mouth Rinse BID  . multivitamin with minerals  1 tablet Oral Daily  . pantoprazole  40 mg Oral Daily  . tamsulosin  0.8 mg Oral Daily  . thiamine injection  100 mg Intravenous Daily   Continuous Infusions:  PRN Meds: albuterol, LORazepam **OR** LORazepam, morphine injection, traMADol   Vital Signs    Vitals:   04/30/19 0400 04/30/19 0500 04/30/19 0600 04/30/19 0800  BP: 126/89 (!) 138/99 (!) 138/102 (!) 146/97  Pulse: 96 98 (!) 104 97  Resp: 10 12 14 14   Temp: 98.8 F (37.1 C)   98.6 F (37 C)  TempSrc: Axillary   Axillary  SpO2: 95% 98% (!) 88% 94%  Weight:      Height:        Intake/Output Summary (Last 24 hours) at 04/30/2019 1009 Last data filed at 04/30/2019 0800 Gross per 24 hour  Intake -  Output 2120 ml  Net -2120 ml   Filed Weights   04/22/19 2216 04/23/19 0922  Weight: 122.5 kg 126.4 kg    Telemetry    Sinus tach, low 100s bpm - Personally Reviewed  ECG    No new tracings - Personally Reviewed  Physical Exam   GEN: Lethargic; No acute distress.   Neck: No JVD. Cardiac: Mildly tachycardic, no murmurs, rubs, or gallops.  Respiratory: Poor inspiratory effort bilaterally.  GI: Soft, nontender, non-distended.   MS: No edema; No deformity. Neuro:  Somnolent.  Psych: Somnolent.  Labs    Chemistry Recent Labs  Lab 04/24/19 0642 04/28/19  0502 04/29/19 0505 04/30/19 0457  NA 132* 146* 150* 153*  K 3.2* 4.1 3.8 3.6  CL 94* 107 111 112*  CO2 25 26 23 31   GLUCOSE 102* 111* 161* 116*  BUN 30* 45* 49* 54*  CREATININE 2.52* 1.42* 1.19 1.35*  CALCIUM 8.2* 8.9 9.1 8.9  PROT 6.5  --   --   --   ALBUMIN 3.2*  --   --   --   AST 153*  --   --   --   ALT 49*  --   --   --   ALKPHOS 56  --   --   --   BILITOT 1.2  --   --   --   GFRNONAA 26* 52* >60 55*  GFRAA 30* >60 >60 >60  ANIONGAP 13 13 16* 10     Hematology Recent Labs  Lab 04/25/19 0640 04/27/19 0928 04/28/19 0502  WBC 9.4 13.7* 14.3*  RBC 4.59 5.48 5.01  HGB 13.0 15.4 14.1  HCT 40.5 46.6 41.5  MCV 88.2 85.0 82.8  MCH 28.3 28.1 28.1  MCHC 32.1 33.0 34.0  RDW 20.7* 22.1* 22.1*  PLT 136* 166 197    Cardiac EnzymesNo results for input(s): TROPONINI in the last 168 hours. No results  for input(s): TROPIPOC in the last 168 hours.   BNP Recent Labs  Lab 04/28/19 1629  BNP 2,209.0*     DDimer No results for input(s): DDIMER in the last 168 hours.   Radiology    NM Pulmonary Perf and Vent  Result Date: 04/29/2019 IMPRESSION: Matching areas of diminished ventilation and perfusion in the RIGHT middle lobe and RIGHT lower lobe with better perfusion than ventilation in medial LEFT lower lobe. Findings represent a low probability for pulmonary embolism. Electronically Signed   By: Lavonia Dana M.D.   On: 04/29/2019 14:32   DG Chest Port 1 View  Result Date: 04/29/2019 IMPRESSION: Cardiomegaly and pulmonary venous hypertension. Electronically Signed   By: Nelson Chimes M.D.   On: 04/29/2019 14:13    Cardiac Studies   2D Echo 04/27/2019: 1. Very suboptiaml study even with contrast use. Left ventricular ejection fraction, by visual estimation, is 25 to 30%. The left ventricle has severely decreased function. There is no left ventricular hypertrophy.  2. Definity contrast agent was given IV to delineate the left ventricular endocardial borders.  3. Left  ventricular diastolic parameters are indeterminate.  4. Mildly dilated left ventricular internal cavity size.  5. Global right ventricle has normal systolic function.The right ventricular size is normal. No increase in right ventricular wall thickness.  6. Left atrial size was mildly dilated.  7. Right atrial size was normal.  8. The mitral valve is normal in structure. No evidence of mitral valve regurgitation. No evidence of mitral stenosis.  9. The tricuspid valve is normal in structure. Tricuspid valve regurgitation is not demonstrated. 10. The aortic valve is normal in structure. Aortic valve regurgitation is not visualized. Mild aortic valve sclerosis without stenosis. 11. The pulmonic valve was normal in structure. Pulmonic valve regurgitation is not visualized. 12. TR signal is inadequate for assessing pulmonary artery systolic pressure. 13. The inferior vena cava is normal in size with greater than 50% respiratory variability, suggesting right atrial pressure of 3 mmHg.  Patient Profile     63 y.o. male with history of HTN, opioid and alcohol use, HLD, chronic pain, and GERD who we are seeing for HFrEF and tachycardia.   Assessment & Plan    1. HFrEF: -Felt to be NICM possibly secondary to alcohol in the setting of recent nonischemic Myoview  -Coreg and lisinopril have been discontinued as he refused to swallow oral medications -Resume GDMT as tolerated/able pending GOC -Overall, very poor prognosis  2. Sinus tachycardia: -Felt to be secondary to withdrawal and cardiomyopathy   3. HTN: -Refused oral medications 12/16 -Transitioned to IV metoprolol and enalapril   4. AKI/hypernatremia: -Felt to be secondary to poor PO intake -Hold IV Lasix -Per primary service   5. Overdose/alcohol/opioid use: -S/p Narcan -CIWA protocol  -Per primary service    For questions or updates, please contact [place] Please consult [email or web address] for contact info under  Cardiology/STEMI.    Signed, Christell Faith, PA-C [facility] Pager: [phone] 04/30/2019, 10:09 AM

## 2019-04-30 NOTE — Progress Notes (Signed)
[facility]  - [place] at [facility]   PATIENT NAME: Joshua Petersen    MR#:  [id number]  DATE OF BIRTH:  1955/09/03  SUBJECTIVE:  patient keeps his eyes closed the whole time. Does not respond. Per RN not communicating. Refuses medications.  Patient not eating REVIEW OF SYSTEMS:   Review of Systems  Unable to perform ROS: Patient nonverbal   Tolerating Diet:refuses Tolerating PT:   DRUG ALLERGIES:  No Known Allergies  VITALS:  Blood pressure (!) 138/97, pulse (!) 109, temperature 98.4 F (36.9 C), temperature source Axillary, resp. rate 11, height 5\' 8"  (1.727 m), weight 126.4 kg, SpO2 98 %.  PHYSICAL EXAMINATION:   Physical Examlimited  GENERAL:  63 y.o.-year-old patient lying in the bed with no acute distress. obese EYES: Pupils equal, round, reactive to light and accommodation. No scleral icterus.  HEENT: Head atraumatic, normocephalic. Oropharynx and nasopharynx clear. Oral mucosa dry NECK:  Supple, no jugular venous distention. No thyroid enlargement, no tenderness.  LUNGS: Normal breath sounds bilaterally, no wheezing, rales, rhonchi. No use of accessory muscles of respiration.  CARDIOVASCULAR: S1, S2 normal. No murmurs, rubs, or gallops.  ABDOMEN: Soft, nontender, nondistended. Bowel sounds present. No organomegaly or mass.  EXTREMITIES: No cyanosis, clubbing or edema b/l.    NEUROLOGIC: moves spontaneously extremities. Unable to participate in exam PSYCHIATRIC:  patient means lethargic although opens eyes temporarily on verbal command does not communicate SKIN: exam RN  LABORATORY PANEL:  CBC Recent Labs  Lab 04/28/19 0502  WBC 14.3*  HGB 14.1  HCT 41.5  PLT 197    Chemistries  Recent Labs  Lab 04/24/19 0642 04/28/19 0502 04/30/19 0457  NA 132* 146* 153*  K 3.2* 4.1 3.6  CL 94* 107 112*  CO2 25 26 31   GLUCOSE 102* 111* 116*  BUN 30* 45* 54*  CREATININE 2.52* 1.42* 1.35*  CALCIUM 8.2* 8.9 8.9  MG  --  1.7  --   AST 153*  --   --    ALT 49*  --   --   ALKPHOS 56  --   --   BILITOT 1.2  --   --    Cardiac Enzymes No results for input(s): TROPONINI in the last 168 hours. RADIOLOGY:  NM Pulmonary Perf and Vent  Result Date: 04/29/2019 CLINICAL DATA:  Respiratory failure, requiring BiPAP at night, recently quit smoking, alcohol detox, history of accidental drug overdose, CHF, acute kidney injury, hypertension, former smoker EXAM: NUCLEAR MEDICINE VENTILATION - PERFUSION LUNG SCAN TECHNIQUE: Ventilation images were obtained in multiple projections using inhaled aerosol Tc-86m DTPA. Perfusion images were obtained in multiple projections after intravenous injection of Tc-46m MAA. RADIOPHARMACEUTICALS:  35.52 mCi of Tc-26m DTPA aerosol inhalation and 4.6 mCi Tc81m MAA IV COMPARISON:  None Correlation: FINDINGS: Ventilation: Cardiomegaly. Airway deposition of aerosol centrally. Swallowed aerosol within stomach. Diminished ventilation peripherally in the RIGHT middle lobe laterally and inferiorly. Additional diminished ventilation in medial LEFT lower lobe and minimally at lateral/basilar RIGHT lower lobe. Perfusion: Cardiomegaly. Matching areas of diminished perfusion in the RIGHT middle lobe and lateral/basilar RIGHT lower lobe. Better perfusion and ventilation in LEFT lower lobe. Chest radiograph: Cardiomegaly with pulmonary vascular congestion. Minimal RIGHT basilar atelectasis. IMPRESSION: Matching areas of diminished ventilation and perfusion in the RIGHT middle lobe and RIGHT lower lobe with better perfusion than ventilation in medial LEFT lower lobe. Findings represent a low probability for pulmonary embolism. Electronically Signed   By: 63m M.D.   On: 04/29/2019 14:32  DG Chest Port 1 View  Result Date: 04/29/2019 CLINICAL DATA:  Abnormal perfusion lung scan. EXAM: PORTABLE CHEST 1 VIEW COMPARISON:  Nuclear medicine study same day. Radiography 04/26/2019 FINDINGS: Cardiomegaly. Pulmonary venous hypertension without  frank edema. No consolidation, collapse or effusion. IMPRESSION: Cardiomegaly and pulmonary venous hypertension. Electronically Signed   By: Paulina Fusi M.D.   On: 04/29/2019 14:13   ASSESSMENT AND PLAN:  Joshua Petersen is a 63 y.o. male with medical history significant of HTN, chronic low back pain, called EMS and was brought to the hospital for generalized body aches.  He was in the waiting room for a few hours and on intake he was found to be slumped over barely responsive.  *Acute hypoxemic and hypercapnic respiratory failure due to possible Mild CHF and ?narcotic OD - He is tolerating 3 L/min oxygen via nasal cannula wean down as tolerated  *Acute toxic metabolic encephalopathy ?etio unclear - Continue supportive care -he does not communicate. -Not on any sedating meds--d/c [place] (pt here for 7 days) -requesting neurology Dr. Thad Ranger to see patient.? MRI brain -trying high dose of IV thiamine to see if it is helpful  *Acute kidney injury: Improving but creatinine is back to baseline.   -Creatinine was 0.81 on 04/17/2019.   -No hydronephrosis or obstruction noted on renal ultrasound.   -creatinine up--d/c lasix  *Acute systolic heart failure fluid overload/acute pulmonary edema/sinus tachycardia -2D echo on 04/27/2019 showed EF of 25 to 30%.   -cont IV metoprolol today to control his heart rate because he refused to swallow his pills including carvedilol.    -Consulted [facility] cardiologist.  -low probability of PE on V/Q scan  -received IV lasix with good UOP 15 liters--d/c lasix since pt's is not taking anything po and sodium up  *BPH with acute urinary retention: Foley catheter in place.   -Continue finasteride and Flomax as able.  *Hypernatremia secondary to poor PO intake  -discussed with dietitian. Patient has not eaten in last six days. -He is denying to take his medication and oral diet. -start d5w  *Alcohol abuse with alcohol withdrawal syndrome: He is off of  Precedex drip.  -? Depression ? Failure to thrive  -palliative care to see patient. Overall poor prognosis -psychiatry to see patient-- patient not communicating. Psychiatry unable to give any recommendations at present -trial of high dose IV thiamine   *Hypertension:  -metoprolol IV and IV enalapril  *Debility: PT and OT evaluation when he is alert enough to participate in therapy. -Overall poor prognosis. - Palliative care consultation placed-- input appreciated  I spoke with patient's son [name] healthcare power of attorney at length. He felt his father went downhill once his narcotics were discontinued during last visit at [facility] pain clinic which was November 2. Discussed with patient's son that patient has severe cardiomyopathy and given his alcohol abuse it could have contributed as well. Son understands patient has a poor prognosis he wants to wait a couple days to see if patient was any improvement.   Procedures:none Family communication : none consults : cardiology, psychiatry, neurology  Discharge Disposition :TBD CODE STATUS: FULL DVT Prophylaxis :lovenox  TOTAL TIME TAKING CARE OF THIS PATIENT: *30* minutes.  >50% time spent on counselling and coordination of care  POSSIBLE D/C IN **?* DAYS, DEPENDING ON CLINICAL CONDITION.  Note: This dictation was prepared with Dragon dictation along with smaller phrase technology. Any transcriptional errors that result from this process are unintentional.  Enedina Finner M.D on 04/30/2019 at 2:07  PM  Between 7am to 6pm - Pager - [phone]  After 6pm go to [email or web address]  [facility]   CC: Primary care physician; [name], MDPatient ID: [name], male   DOB: [birth year], 63 y.o.   MRN: [id number]

## 2019-04-30 NOTE — Progress Notes (Signed)
Patient refused bipap. On nasal cannula tolerating well. No distress noted.

## 2019-04-30 NOTE — Plan of Care (Addendum)
Called to speak with daughter [name]. She states she has not spoken with her father in years and wants [name] to make decisions for their father. She wants no decision making role in his care.  ADDENDUM: Son stated during E link visit his mother and father may be married. Spoke with mother Soyla Murphy confirms marriage, but does not want decision making ability for her husband.   [name] is Media planner.

## 2019-04-30 NOTE — Progress Notes (Addendum)
Pt transferred to room 118, report given to Robyn, pt on 3 liters [place] in room with maintenance fluids infusing when writing RN exited room 118.  CCMD notified he was placed on box 38. His belongings left at bedside.  Pt somewhat more verbal after he returned from MRI, requesting water (gave him ice chips which he tolerated), and became irritable when moved to new bed, stated "Ya'll don't listen."

## 2019-04-30 NOTE — Progress Notes (Signed)
Daily Progress Note   Patient Name: Joshua Petersen       Date: 04/30/2019 DOB: January 12, 1956  Age: 62 y.o. MRN#: [id number] Attending Physician: Fritzi Mandes, MD Primary Care Physician: [name], MD Admit Date: 04/23/2019  Reason for Consultation/Follow-up: Establishing goals of care  Subjective: Patient is resting in bed. He opens his eyes briefly and says hey to his son very Cytogeneticist and closes them again. Discussed his father's status. Have spoken with Joshua Petersen, the patient's daughter, and she does not want to be involved in any way with her father's care and wants Joshua Petersen to make decisions. She advises they have not spoken in many years.   Dr. Arta Bruce in to speak with son over [name] during my [place] visit. We discussed his status and treatment goals and QOL. He does not feel his father would want to live in a state where he could not have independence or a situation that prolongs suffering. He is confused that his father was driving he states a little over a month ago, and now is in his current state of lethargy and lack of PO intake, and refusal of oral medications. He would like to give him another couple of days before making a decision about a feeding tube vs comfort care and hospice placement.  He advises he would like to discuss this with his mother  Joshua Petersen, and then advises his mother and father may still be legally married, that he is not sure, because different documents say different things. Spoke with his mother on a conference call with him. She confirms marriage, and states she lives at [facility] herself. She states she will help Joshua Petersen in any way behind the scenes, but does not want to be the decision maker for Joshua Petersen, and wants Joshua Petersen to make them.     Length of Stay: 7  Current Medications: Scheduled Meds:  . atorvastatin  40 mg Oral QHS  . buprenorphine-naloxone  1 tablet Sublingual Daily  . chlordiazePOXIDE  5 mg Oral TID  . Chlorhexidine Gluconate Cloth  6 each Topical Daily  . enalaprilat  0.625 mg Intravenous Daily  . enoxaparin (LOVENOX) injection  40 mg Subcutaneous Q24H  . finasteride  5 mg Oral Daily  . folic acid  1 mg Intravenous Daily  . mouth rinse  15 mL Mouth Rinse BID  . metoprolol tartrate  2.5 mg Intravenous Q8H  . multivitamin with minerals  1 tablet Oral Daily  . pantoprazole  40 mg Oral Daily  . tamsulosin  0.8 mg Oral Daily  . thiamine injection  100 mg Intravenous Daily    Continuous Infusions:   PRN Meds: albuterol, LORazepam **OR** LORazepam, morphine injection, traMADol  Physical Exam Constitutional:      Comments: Opens eyes briefly.   Pulmonary:     Effort: Pulmonary effort is normal.             Vital Signs: BP (!) 143/90   Pulse (!) 101   Temp 98.6 F (37 C) (Axillary)   Resp 12   Ht 5\' 8"  (1.727 m)   Wt 126.4 kg   SpO2 99%   BMI 42.37 kg/m  SpO2: SpO2: 99 % O2 Device: O2 Device: Nasal Cannula O2 Flow Rate: O2 Flow Rate (L/min): 3 L/min  Intake/output summary:   Intake/Output Summary (Last 24 hours) at 04/30/2019 1208 Last data filed at 04/30/2019 0900 Gross per 24 hour  Intake 0 ml  Output 1300 ml  Net -1300 ml   LBM: Last BM Date: 04/28/19 Baseline Weight: Weight: 122.5 kg Most recent weight: Weight: 126.4 kg       Palliative Assessment/Data: 10%      Patient Active Problem List   Diagnosis Date Noted  . Essential hypertension   . Acute systolic CHF (congestive heart failure) (HCC) 04/28/2019  . Alcohol withdrawal syndrome with complication, with unspecified complication (HCC) 04/24/2019  . Acute respiratory failure with hypoxia and hypercapnia (HCC) 04/24/2019  . AKI (acute kidney injury) (HCC) 04/24/2019  . Drug overdose 04/23/2019  . Status post  total knee replacement using cement, right 12/11/2018  . Chronic pain of right knee 06/05/2018  . 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 Primary osteoarthritis of right knee 05/08/2018  . 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 Morbid obesity with BMI of 40.0-44.9, adult (HCC) 12/28/2015    Palliative Care Assessment & Plan    Recommendations/Plan:  [name] is patient's decision maker as wife Joshua Petersen has deferred decision maker as they have been legally separated for years. Patient's daughter Joshua Petersen has also deferred decision making to her brother [name] as she states they have not spoken in many years.    [name] would like to see how his father does over the next few days before making a decision of continuing life prolonging care with feeding tube vs hospice.  Patient is hospice facility appropriate if son chooses. If hospice facility placement is chosen before our next conversation on Monday at 10:00, please consult CM or SW for request.      Code Status:    Code Status Orders  (From admission, onward)         Start     Ordered   04/23/19 0701  Full code  Continuous     04/23/19 0702        Code Status History    Date Active Date Inactive Code Status Order ID Comments User Context   12/11/2018 1131 12/12/2018 1530 Full Code [id number]  Christena Flake, MD Inpatient   03/12/2017 1246 03/15/2017 1756 Full Code [id number]  PoggiExcell Seltzer, MD Inpatient   Advance Care Planning Activity       Prognosis:   < 2 weeks if stopping IV's and life prolonging care. Lack of oral intake, refusing medications.  Lethargy. CHF.     Care plan was discussed with RN  Thank you for allowing the Palliative Medicine Team to assist in the care of this patient.   Time In: 9:50 Time Out: 11:30 Total Time 1 hour 40 min Prolonged Time Billed yes      Greater than 50%  of this time was spent counseling and coordinating care related to the  above assessment and plan.  Morton Stall, NP  Please contact Palliative Medicine Team phone at [phone] for questions and concerns.
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
## 2019-05-01 ENCOUNTER — Encounter: Payer: Self-pay | Admitting: Internal Medicine

## 2019-05-01 DIAGNOSIS — J9602 Acute respiratory failure with hypercapnia: Secondary | ICD-10-CM

## 2019-05-01 DIAGNOSIS — J9601 Acute respiratory failure with hypoxia: Secondary | ICD-10-CM

## 2019-05-01 DIAGNOSIS — I426 Alcoholic cardiomyopathy: Secondary | ICD-10-CM

## 2019-05-01 DIAGNOSIS — G9341 Metabolic encephalopathy: Secondary | ICD-10-CM

## 2019-05-01 DIAGNOSIS — E870 Hyperosmolality and hypernatremia: Secondary | ICD-10-CM

## 2019-05-01 DIAGNOSIS — T50901D Poisoning by unspecified drugs, medicaments and biological substances, accidental (unintentional), subsequent encounter: Secondary | ICD-10-CM

## 2019-05-01 LAB — SODIUM: Sodium: 155 mmol/L — ABNORMAL HIGH (ref 135–145)

## 2019-05-01 MED ORDER — CARVEDILOL 12.5 MG PO TABS
12.5000 mg | ORAL_TABLET | Freq: Two times a day (BID) | ORAL | Status: DC
  Administered 2019-05-01 – 2019-05-08 (×15): 12.5 mg via ORAL
  Filled 2019-05-01: qty 1
  Filled 2019-05-01: qty 4
  Filled 2019-05-01 (×3): qty 1
  Filled 2019-05-01: qty 4
  Filled 2019-05-01 (×6): qty 1
  Filled 2019-05-01: qty 4
  Filled 2019-05-01: qty 1
  Filled 2019-05-01 (×2): qty 4
  Filled 2019-05-01 (×3): qty 1
  Filled 2019-05-01 (×2): qty 4
  Filled 2019-05-01 (×2): qty 1

## 2019-05-01 MED ORDER — FOLIC ACID 1 MG PO TABS
1.0000 mg | ORAL_TABLET | Freq: Every day | ORAL | Status: DC
  Administered 2019-05-02 – 2019-05-05 (×4): 1 mg via ORAL
  Filled 2019-05-01 (×4): qty 1

## 2019-05-01 MED ORDER — LISINOPRIL 5 MG PO TABS
5.0000 mg | ORAL_TABLET | Freq: Every day | ORAL | Status: DC
  Administered 2019-05-01 – 2019-05-08 (×8): 5 mg via ORAL
  Filled 2019-05-01 (×8): qty 1

## 2019-05-01 MED ORDER — THIAMINE HCL 100 MG PO TABS
500.0000 mg | ORAL_TABLET | Freq: Every day | ORAL | Status: DC
  Administered 2019-05-02: 500 mg via ORAL
  Filled 2019-05-01: qty 5

## 2019-05-01 NOTE — Progress Notes (Signed)
Dr Posey Pronto notified of tele report run of SVT non sustained HR 150, also had similar episode during the night, acknowledged

## 2019-05-01 NOTE — Progress Notes (Signed)
Progress Note  Patient Name: Joshua Petersen Date of Encounter: 05/01/2019  Primary Cardiologist: [place], not oriented, minimally verbal Food in front of him, does not recognize the food or the tray, no desire to feed himself Looking around the room, engaging in conversation Nurses report he was able to swallow a pill yesterday Has not passed speech and swallow   Inpatient Medications    Scheduled Meds: . atorvastatin  40 mg Oral QHS  . buprenorphine-naloxone  1 tablet Sublingual Daily  . carvedilol  12.5 mg Oral BID WC  . chlordiazePOXIDE  5 mg Oral TID  . Chlorhexidine Gluconate Cloth  6 each Topical Daily  . enoxaparin (LOVENOX) injection  40 mg Subcutaneous Q24H  . feeding supplement (ENSURE ENLIVE)  237 mL Oral BID BM  . finasteride  5 mg Oral Daily  . folic acid  1 mg Intravenous Daily  . lisinopril  5 mg Oral Daily  . mouth rinse  15 mL Mouth Rinse BID  . multivitamin with minerals  1 tablet Oral Daily  . pantoprazole  40 mg Oral Daily  . tamsulosin  0.8 mg Oral Daily   Continuous Infusions: . dextrose 50 mL/hr at 05/01/19 0509  . thiamine injection Stopped (04/30/19 1529)   PRN Meds: albuterol, morphine injection, traMADol   Vital Signs    Vitals:   04/30/19 1600 04/30/19 1800 04/30/19 1908 05/01/19 0254  BP: 138/89 (!) 156/110 (!) 150/116 (!) 134/99  Pulse: (!) 101 98 97 (!) 101  Resp: (!) 5 14  18   Temp: 98.1 F (36.7 C)  98.2 F (36.8 C) 98.5 F (36.9 C)  TempSrc: Oral  Oral   SpO2: 91% 99% 98% 99%  Weight:      Height:        Intake/Output Summary (Last 24 hours) at 05/01/2019 1638 Last data filed at 05/01/2019 4665 Gross per 24 hour  Intake 759.94 ml  Output 1400 ml  Net -640.06 ml   Filed Weights   04/22/19 2216 04/23/19 0922  Weight: 122.5 kg 126.4 kg    Telemetry    Sinus tach, low 100s bpm - Personally Reviewed  ECG    No new tracings - Personally Reviewed  Physical Exam   Constitutional:  oriented  to person, place, and time. No distress.  HENT:  Head: Grossly normal Eyes:  no discharge. No scleral icterus.  Neck: No JVD, no carotid bruits  Cardiovascular: Regular rate and rhythm, no murmurs appreciated Pulmonary/Chest: Clear to auscultation bilaterally, no wheezes or rails Abdominal: Soft.  no distension.  no tenderness.  Musculoskeletal: Grossly nonfocal, normal Neurological:  normal muscle tone.  Unable to test Skin: Skin warm and dry Psychiatric: Alert, not oriented  Labs    Chemistry Recent Labs  Lab 04/28/19 0502 04/29/19 0505 04/30/19 0457 04/30/19 1356  NA 146* 150* 153* 155*  K 4.1 3.8 3.6  --   CL 107 111 112*  --   CO2 26 23 31   --   GLUCOSE 111* 161* 116*  --   BUN 45* 49* 54*  --   CREATININE 1.42* 1.19 1.35*  --   CALCIUM 8.9 9.1 8.9  --   GFRNONAA 52* >60 55*  --   GFRAA >60 >60 >60  --   ANIONGAP 13 16* 10  --      Hematology Recent Labs  Lab 04/25/19 0640 04/27/19 0928 04/28/19 0502  WBC 9.4 13.7* 14.3*  RBC 4.59 5.48 5.01  HGB  13.0 15.4 14.1  HCT 40.5 46.6 41.5  MCV 88.2 85.0 82.8  MCH 28.3 28.1 28.1  MCHC 32.1 33.0 34.0  RDW 20.7* 22.1* 22.1*  PLT 136* 166 197    Cardiac EnzymesNo results for input(s): TROPONINI in the last 168 hours. No results for input(s): TROPIPOC in the last 168 hours.   BNP Recent Labs  Lab 04/28/19 1629  BNP 2,209.0*     DDimer No results for input(s): DDIMER in the last 168 hours.   Radiology    NM Pulmonary Perf and Vent  Result Date: 04/29/2019 IMPRESSION: Matching areas of diminished ventilation and perfusion in the RIGHT middle lobe and RIGHT lower lobe with better perfusion than ventilation in medial LEFT lower lobe. Findings represent a low probability for pulmonary embolism. Electronically Signed   By: Ulyses Southward M.D.   On: 04/29/2019 14:32   DG Chest Port 1 View  Result Date: 04/29/2019 IMPRESSION: Cardiomegaly and pulmonary venous hypertension. Electronically Signed   By: Paulina Fusi  M.D.   On: 04/29/2019 14:13    Cardiac Studies   2D Echo 04/27/2019: 1. Very suboptiaml study even with contrast use. Left ventricular ejection fraction, by visual estimation, is 25 to 30%. The left ventricle has severely decreased function. There is no left ventricular hypertrophy.  2. Definity contrast agent was given IV to delineate the left ventricular endocardial borders.  3. Left ventricular diastolic parameters are indeterminate.  4. Mildly dilated left ventricular internal cavity size.  5. Global right ventricle has normal systolic function.The right ventricular size is normal. No increase in right ventricular wall thickness.  6. Left atrial size was mildly dilated.  7. Right atrial size was normal.  8. The mitral valve is normal in structure. No evidence of mitral valve regurgitation. No evidence of mitral stenosis.  9. The tricuspid valve is normal in structure. Tricuspid valve regurgitation is not demonstrated. 10. The aortic valve is normal in structure. Aortic valve regurgitation is not visualized. Mild aortic valve sclerosis without stenosis. 11. The pulmonic valve was normal in structure. Pulmonic valve regurgitation is not visualized. 12. TR signal is inadequate for assessing pulmonary artery systolic pressure. 13. The inferior vena cava is normal in size with greater than 50% respiratory variability, suggesting right atrial pressure of 3 mmHg.  Patient Profile     63 y.o. male with history of HTN, opioid and alcohol use, HLD, chronic pain, and GERD who we are seeing for HFrEF and tachycardia.   Assessment & Plan    1. HFrEF: -Felt to be NICM possibly secondary to alcohol in the setting of recent nonischemic Myoview  Would recommend speech and swallow evaluation prior to restarting carvedilol lisinopril = If able to tolerate any prescription pressure medications -Overall, very poor prognosis, suspect very long recovery  2. Sinus tachycardia: -Felt to be secondary to  withdrawal and cardiomyopathy  Stable with heart rate 100, continues metoprolol as needed for heart rate sustained greater than 120  3. HTN: -Refused oral medications 12/16 -Transitioned to IV metoprolol and enalapril   4. AKI/hypernatremia: = On IV fluids  5. Overdose/alcohol/opioid use: -S/p Narcan -CIWA protocol  -Per primary service  Long discussion with palliative care yesterday  Case discussed with hospitalist service today  Total encounter time more than 25 minutes  Greater than 50% was spent in counseling and coordination of care with the patient   For questions or updates, please contact [facility] Please consult [email or web address] for contact info under Cardiology/STEMI.   Signed, Dossie Arbour,  MD, Ph.D [facility]

## 2019-05-01 NOTE — Progress Notes (Signed)
[facility] [place] NAME: Shane Badeaux    MR#:  [id number]  DATE OF BIRTH:  1955-10-11  SUBJECTIVE:  open verbal command. Told me his name his son's name.  Dependent on RN to feed him. Eating very poorly. REVIEW OF SYSTEMS:   Review of Systems  Unable to perform ROS: Patient nonverbal   Tolerating Diet:refuses Tolerating PT:   DRUG ALLERGIES:  No Known Allergies  VITALS:  Blood pressure (!) 134/99, pulse (!) 101, temperature 98.5 F (36.9 C), resp. rate 18, height 5\' 8"  (1.727 m), weight 126.4 kg, SpO2 99 %.  PHYSICAL EXAMINATION:   Physical Examlimited  GENERAL:  63 y.o.-year-old patient lying in the bed with no acute distress. obese EYES: Pupils equal, round, reactive to light and accommodation. No scleral icterus.  HEENT: Head atraumatic, normocephalic. Oropharynx and nasopharynx clear. Oral mucosa dry NECK:  Supple, no jugular venous distention. No thyroid enlargement, no tenderness.  LUNGS: Normal breath sounds bilaterally, no wheezing, rales, rhonchi. No use of accessory muscles of respiration.  CARDIOVASCULAR: S1, S2 normal. No murmurs, rubs, or gallops.  ABDOMEN: Soft, nontender, nondistended. Bowel sounds present. No organomegaly or mass.  EXTREMITIES: No cyanosis, clubbing or edema b/l.    NEUROLOGIC: moves spontaneously extremities. Unable to participate in exam PSYCHIATRIC:  patient means lethargic although opens eyes temporarily on verbal command  SKIN: exam RN  LABORATORY PANEL:  CBC Recent Labs  Lab 04/28/19 0502  WBC 14.3*  HGB 14.1  HCT 41.5  PLT 197    Chemistries  Recent Labs  Lab 04/28/19 0502 04/30/19 0457 04/30/19 1356  NA 146* 153* 155*  K 4.1 3.6  --   CL 107 112*  --   CO2 26 31  --   GLUCOSE 111* 116*  --   BUN 45* 54*  --   CREATININE 1.42* 1.35*  --   CALCIUM 8.9 8.9  --   MG 1.7  --   --    Cardiac Enzymes No results for input(s): TROPONINI in the last 168 hours. RADIOLOGY:   EEG  Result Date: 05/01/2019 [name], MD     05/01/2019  1:52 PM ELECTROENCEPHALOGRAM REPORT Patient: RACHID PARHAM       Room #: 118A-AA EEG No. ID: [id number] Age: 63 y.o.        Sex: male Referring Physician: Posey Pronto Report Date:  05/01/2019       Interpreting Physician: [name] History: KRISTAN VOTTA is an 63 y.o. male with altered mental status Medications: Lipitor, Thiamine, Coreg, Suboxone, Librium, Proscar, Zestril, Flomax Conditions of Recording:  This is a 21 channel routine scalp EEG performed with bipolar and monopolar montages arranged in accordance to the international 10/20 system of electrode placement. One channel was dedicated to EKG recording. The patient is in the drowsy state. Description:  The background activity appears to represent drowse for the majority of the recording.  The background is slow and poorly organized consisting of a mixture of low voltage theta and delta activity.  Some superimposed beta activity is noted at times.  The background does activate with movement.   No epileptiform activity is noted.  There is no evidence of stage II sleep. Hyperventilation was not performed.  Intermittent photic stimulation was performed but failed to illicit any change in the tracing.  IMPRESSION: This is a normal drowsy electroencephalogram with activation procedures. There are no focal lateralizing or epileptiform features. [name], MD Neurology [phone] 05/01/2019, 1:48 PM  MR BRAIN WO CONTRAST  Result Date: 04/30/2019 CLINICAL DATA:  Follow-up stroke.  Altered mental status. EXAM: MRI HEAD WITHOUT CONTRAST TECHNIQUE: Multiplanar, multiecho pulse sequences of the brain and surrounding structures were obtained without intravenous contrast. COMPARISON:  Head CT 04/23/2019 FINDINGS: Brain: Diffusion imaging does not show any acute or subacute infarction or other cause of restricted diffusion. No brainstem or cerebellar abnormality is seen. Cerebral hemispheres  show moderate chronic small-vessel ischemic changes of the white matter. No large vessel territory infarction. No mass lesion, hemorrhage, hydrocephalus or extra-axial collection. Vascular: Major vessels at the base of the brain show flow. Skull and upper cervical spine: Negative Sinuses/Orbits: Clear/normal Other: None IMPRESSION: No acute or reversible finding. Moderate chronic small-vessel ischemic changes of the cerebral hemispheric white matter. Electronically Signed   By: Paulina Fusi M.D.   On: 04/30/2019 17:01   ASSESSMENT AND PLAN:  VERDELL DYKMAN is a 63 y.o. male with medical history significant of HTN, chronic low back pain, called EMS and was brought to the hospital for generalized body aches.  He was in the waiting room for a few hours and on intake he was found to be slumped over barely responsive.  *Acute toxic metabolic encephalopathy ?etio unclear  - Continue supportive care -he does not communicate much--now saying one word -Not on any sedating meds--d/c [place] (pt here for 7 days) -requesting neurology Dr. Thad Ranger -- appreciate input -MRI brain negative -EEG no epileptiform activity -continue PO thiamine multivitamin and Folate  *Acute kidney injury: Improving but creatinine is back to baseline.   -Creatinine was 0.81 on 04/17/2019.   -No hydronephrosis or obstruction noted on renal ultrasound.   -creatinine up--d/c lasix   *Acute systolic heart failure fluid overload/acute pulmonary edema/sinus tachycardia -2D echo on 04/27/2019 showed EF of 25 to 30%.   -cont IV metoprolol today to control his heart rate because he refused to swallow his pills including carvedilol.    -Consulted [facility] cardiologist.  -low probability of PE on V/Q scan  -received IV lasix with good UOP 15 liters--d/c lasix since pt's is not taking anything po and sodium up  *BPH with acute urinary retention: Foley catheter in place.   -Continue finasteride and Flomax as able.  *Hypernatremia secondary  to poor PO intake  -discussed with dietitian. Patient has not eaten in last six days. -He is denying to take his medication and oral diet. -now onIV d5w -sodium 143--150--155  *Alcohol abuse with alcohol withdrawal syndrome: He is off of Precedex drip.  -? Depression ? Failure to thrive ?organic brain syndrome -palliative care to see patient. Overall poor prognosis -psychiatry to see patient-- patient not communicating. Psychiatry unable to give any recommendations at present -trial of high dose thiamine   *Hypertension:  -po coreg and lisinopril  *Debility: PT and OT evaluation when he is alert enough to participate in therapy. -Overall poor prognosis. - Palliative care consultation placed-- input appreciated  Message for son to call me  Procedures:none Family communication : none consults : cardiology, psychiatry, neurology  Discharge Disposition :TBD CODE STATUS: FULL DVT Prophylaxis :lovenox  TOTAL TIME TAKING CARE OF THIS PATIENT: *25 minutes.  >50% time spent on counselling and coordination of care  POSSIBLE D/C IN **?* DAYS, DEPENDING ON CLINICAL CONDITION.  Note: This dictation was prepared with Dragon dictation along with smaller phrase technology. Any transcriptional errors that result from this process are unintentional.  Enedina Finner M.D on 05/01/2019 at 2:56 PM  Between 7am to 6pm - Pager -  [phone]  After 6pm go to [email or web address]  [facility]   CC: Primary care physician; Dione Housekeeper, MDPatient ID: [name], male   DOB: [birth year], 63 y.o.   MRN: [id number]

## 2019-05-01 NOTE — Procedures (Signed)
ELECTROENCEPHALOGRAM REPORT   Patient: Joshua Petersen       Room #: 118A-AA EEG No. ID: [id number] Age: 63 y.o.        Sex: male Referring Physician: Posey Pronto Report Date:  05/01/2019        Interpreting Physician: [name]  History: SCHNEIDER WARCHOL is an 63 y.o. male with altered mental status  Medications:  Lipitor, Thiamine, Coreg, Suboxone, Librium, Proscar, Zestril, Flomax  Conditions of Recording:  This is a 21 channel routine scalp EEG performed with bipolar and monopolar montages arranged in accordance to the international 10/20 system of electrode placement. One channel was dedicated to EKG recording.  The patient is in the drowsy state.  Description:  The background activity appears to represent drowse for the majority of the recording.  The background is slow and poorly organized consisting of a mixture of low voltage theta and delta activity.  Some superimposed beta activity is noted at times.  The background does activate with movement.    No epileptiform activity is noted.   There is no evidence of stage II sleep. Hyperventilation was not performed.  Intermittent photic stimulation was performed but failed to illicit any change in the tracing.     IMPRESSION: This is a normal drowsy electroencephalogram with activation procedures. There are no focal lateralizing or epileptiform features.   [name], MD Neurology [phone] 05/01/2019, 1:48 PM

## 2019-05-01 NOTE — Progress Notes (Signed)
eeg completed ° °

## 2019-05-01 NOTE — Progress Notes (Signed)
Subjective: Patient improved today.  More speech today.  Objective: Current vital signs: BP (!) 134/99 (BP Location: Right Arm)   Pulse (!) 101   Temp 98.5 F (36.9 C)   Resp 18   Ht 5\' 8"  (1.727 m)   Wt 126.4 kg   SpO2 99%   BMI 42.37 kg/m  Vital signs in last 24 hours: Temp:  [98.1 F (36.7 C)-98.5 F (36.9 C)] 98.5 F (36.9 C) (12/18 0254) Pulse Rate:  [96-109] 101 (12/18 0254) Resp:  [5-27] 18 (12/18 0254) BP: (134-156)/(88-116) 134/99 (12/18 0254) SpO2:  [91 %-99 %] 99 % (12/18 0254)  Intake/Output from previous day: 12/17 0701 - 12/18 0700 In: 759.9 [I.V.:709.9; IV Piggyback:50] Out: 1500 [Urine:1500] Intake/Output this shift: No intake/output data recorded. Nutritional status:  Diet Order            Diet 2 gram sodium Room service appropriate? Yes; Fluid consistency: Thin  Diet effective now              Neurologic Exam: Mental Status: Alert.  Speaking more.  Follows some simple commands. Cranial Nerves: II: Blinks to bilateral confrontation.   III,IV, VI: Extra-ocular motions intact bilaterally V,VII: smile symmetric, facial light touch sensation normal bilaterally VIII: hearing normal bilaterally IX,X: gag reflex present XI: bilateral shoulder shrug XII: midline tongue extension Motor: Moving all extremities against gravity Sensory: Pinprick and light touch intact throughout, bilaterally   Lab Results: Basic Metabolic Panel: Recent Labs  Lab 04/25/19 0640 04/26/19 1240 04/27/19 0928 04/28/19 0502 04/29/19 0505 04/30/19 0457 04/30/19 1356  NA 136 139 143 146* 150* 153*  --   K 3.8 4.6 4.4 4.1 3.8 3.6  --   CL 101 107 107 107 111 112*  --   CO2 23 19* 20* 26 23 31   --   GLUCOSE 118* 158* 146* 111* 161* 116*  --   BUN 30* 34* 41* 45* 49* 54*  --   CREATININE 2.20* 1.67* 1.71* 1.42* 1.19 1.35*  --   CALCIUM 8.6* 8.5* 8.9 8.9 9.1 8.9  --   MG 1.9  --   --  1.7  --   --   --   PHOS 2.9  --   --  3.3  --   --  4.6    Liver Function  Tests: No results for input(s): AST, ALT, ALKPHOS, BILITOT, PROT, ALBUMIN in the last 168 hours. No results for input(s): LIPASE, AMYLASE in the last 168 hours. Recent Labs  Lab 04/30/19 1356  AMMONIA 38*    CBC: Recent Labs  Lab 04/25/19 0640 04/27/19 0928 04/28/19 0502  WBC 9.4 13.7* 14.3*  HGB 13.0 15.4 14.1  HCT 40.5 46.6 41.5  MCV 88.2 85.0 82.8  PLT 136* 166 197    Cardiac Enzymes: No results for input(s): CKTOTAL, CKMB, CKMBINDEX, TROPONINI in the last 168 hours.  Lipid Panel: Recent Labs  Lab 04/28/19 1555  CHOL 156  TRIG 115  HDL 58  CHOLHDL 2.7  VLDL 23  LDLCALC 75    CBG: Recent Labs  Lab 04/28/19 0759 04/28/19 1206 04/28/19 1633  GLUCAP 104* 151* 120*    Microbiology: Results for orders placed or performed during the hospital encounter of 04/23/19  Blood Culture (routine x 2)     Status: None   Collection Time: 04/23/19 12:30 AM   Specimen: BLOOD  Result Value Ref Range Status   Specimen Description [facility]LOOD LEFT [facility]  Final   Special Requests   Final  BOTTLES DRAWN AEROBIC AND ANAEROBIC Blood Culture adequate volume   Culture   Final    NO GROWTH 5 DAYS Performed at [facility], [address] [place], [place] [address]    Report Status 04/28/2019 FINAL  Final  Blood Culture (routine x 2)     Status: None   Collection Time: 04/23/19  3:35 AM   Specimen: BLOOD  Result Value Ref Range Status   Specimen Description BLOOD RIGHT HAND  Final   Special Requests   Final    BOTTLES DRAWN AEROBIC AND ANAEROBIC Blood Culture adequate volume   Culture   Final    NO GROWTH 5 DAYS Performed at [facility], [address], [place], [place] [address]    Report Status 04/28/2019 FINAL  Final  Urine culture     Status: None   Collection Time: 04/23/19  4:21 AM   Specimen: Urine, Clean Catch  Result Value Ref Range Status   Specimen Description   Final    URINE, CLEAN CATCH Performed at [facility], [address], [place], [place] [address]    Special Requests   Final    NONE Performed at [facility], [address], [place], [place] [address]    Culture   Final    NO GROWTH Performed at [facility], [place] [address] [place], [place] [address]    Report Status 04/24/2019 FINAL  Final  SARS CORONAVIRUS 2 (TAT 6-24 HRS) Nasopharyngeal Nasopharyngeal Swab     Status: None   Collection Time: 04/23/19  5:30 AM   Specimen: Nasopharyngeal Swab  Result Value Ref Range Status   SARS Coronavirus 2 NEGATIVE NEGATIVE Final    Comment: (NOTE) SARS-CoV-2 target nucleic acids are NOT DETECTED. The SARS-CoV-2 RNA is generally detectable in upper and lower respiratory specimens during the acute phase of infection. Negative results do not preclude SARS-CoV-2 infection, do not rule out co-infections with other pathogens, and should not be used as the sole basis for treatment or other patient management decisions. Negative results must be combined with clinical observations, patient history, and epidemiological information. The expected result is Negative. Fact Sheet for Patients: SugarRoll.be Fact Sheet for Healthcare Providers: https://www.woods-mathews.com/ This test is not yet approved or cleared by the [place] FDA and  has been authorized for detection and/or diagnosis of SARS-CoV-2 by FDA under an Emergency Use Authorization (EUA). This EUA will remain  in effect (meaning this test can be used) for the duration of the COVID-19 declaration under Section 56 4(b)(1) of the Act, 21 U.S.C. section 360bbb-3(b)(1), unless the authorization is terminated or revoked sooner. Performed at [facility], [place] [address], [place], [place] [address]   MRSA PCR Screening     Status: None   Collection Time: 04/23/19  9:29 AM   Specimen: Nasopharyngeal  Result Value Ref Range Status   MRSA by PCR NEGATIVE NEGATIVE Final    Comment:         The GeneXpert MRSA Assay (FDA approved for NASAL specimens only), is one component of a comprehensive MRSA colonization surveillance program. It is not intended to diagnose MRSA infection nor to guide or monitor treatment for MRSA infections. Performed at [facility], [address] [place], [place] [address]     Coagulation Studies: No results for input(s): LABPROT, INR in the last 72 hours.  Imaging: MR BRAIN WO CONTRAST  Result Date: 04/30/2019 CLINICAL DATA:  Follow-up stroke.  Altered mental status. EXAM:  MRI HEAD WITHOUT CONTRAST TECHNIQUE: Multiplanar, multiecho pulse sequences of the brain and surrounding structures were obtained without intravenous contrast. COMPARISON:  Head CT 04/23/2019 FINDINGS: Brain: Diffusion imaging does not show any acute or subacute infarction or other cause of restricted diffusion. No brainstem or cerebellar abnormality is seen. Cerebral hemispheres show moderate chronic small-vessel ischemic changes of the white matter. No large vessel territory infarction. No mass lesion, hemorrhage, hydrocephalus or extra-axial collection. Vascular: Major vessels at the base of the brain show flow. Skull and upper cervical spine: Negative Sinuses/Orbits: Clear/normal Other: None IMPRESSION: No acute or reversible finding. Moderate chronic small-vessel ischemic changes of the cerebral hemispheric white matter. Electronically Signed   By: Paulina Fusi M.D.   On: 04/30/2019 17:01   NM Pulmonary Perf and Vent  Result Date: 04/29/2019 CLINICAL DATA:  Respiratory failure, requiring BiPAP at night, recently quit smoking, alcohol detox, history of accidental drug overdose, CHF, acute kidney injury, hypertension, former smoker EXAM: NUCLEAR MEDICINE VENTILATION - PERFUSION LUNG SCAN TECHNIQUE: Ventilation images were obtained in multiple projections using inhaled aerosol Tc-40m DTPA. Perfusion images were obtained in multiple projections after intravenous  injection of Tc-85m MAA. RADIOPHARMACEUTICALS:  35.52 mCi of Tc-56m DTPA aerosol inhalation and 4.6 mCi Tc40m MAA IV COMPARISON:  None Correlation: FINDINGS: Ventilation: Cardiomegaly. Airway deposition of aerosol centrally. Swallowed aerosol within stomach. Diminished ventilation peripherally in the RIGHT middle lobe laterally and inferiorly. Additional diminished ventilation in medial LEFT lower lobe and minimally at lateral/basilar RIGHT lower lobe. Perfusion: Cardiomegaly. Matching areas of diminished perfusion in the RIGHT middle lobe and lateral/basilar RIGHT lower lobe. Better perfusion and ventilation in LEFT lower lobe. Chest radiograph: Cardiomegaly with pulmonary vascular congestion. Minimal RIGHT basilar atelectasis. IMPRESSION: Matching areas of diminished ventilation and perfusion in the RIGHT middle lobe and RIGHT lower lobe with better perfusion than ventilation in medial LEFT lower lobe. Findings represent a low probability for pulmonary embolism. Electronically Signed   By: Ulyses Southward M.D.   On: 04/29/2019 14:32   DG Chest Port 1 View  Result Date: 04/29/2019 CLINICAL DATA:  Abnormal perfusion lung scan. EXAM: PORTABLE CHEST 1 VIEW COMPARISON:  Nuclear medicine study same day. Radiography 04/26/2019 FINDINGS: Cardiomegaly. Pulmonary venous hypertension without frank edema. No consolidation, collapse or effusion. IMPRESSION: Cardiomegaly and pulmonary venous hypertension. Electronically Signed   By: Paulina Fusi M.D.   On: 04/29/2019 14:13    Medications:  I have reviewed the patient's current medications. Scheduled: . atorvastatin  40 mg Oral QHS  . buprenorphine-naloxone  1 tablet Sublingual Daily  . carvedilol  12.5 mg Oral BID WC  . chlordiazePOXIDE  5 mg Oral TID  . Chlorhexidine Gluconate Cloth  6 each Topical Daily  . enoxaparin (LOVENOX) injection  40 mg Subcutaneous Q24H  . feeding supplement (ENSURE ENLIVE)  237 mL Oral BID BM  . finasteride  5 mg Oral Daily  . folic  acid  1 mg Intravenous Daily  . lisinopril  5 mg Oral Daily  . mouth rinse  15 mL Mouth Rinse BID  . multivitamin with minerals  1 tablet Oral Daily  . pantoprazole  40 mg Oral Daily  . tamsulosin  0.8 mg Oral Daily    Assessment/Plan: 63 y.o. male who is unable to provide any history due to being nonverbal.  All history obtained from the chart.  Patient with medical history significant ofHTN, chronic low back pain, called EMS and was brought to the hospital for generalized body aches on 12/9. He was in the waiting  room for a few hours and on intake he was found to be slumped over barely responsive. He received Narcan with improvement in his mental status and was placed on a Narcan drip. An ABG showed hypercarbic respiratory failure with respiratory acidosis and he was placed on BiPAP.  Patient now off BiPAP.  Remains altered.  Has had multiple metabolic issues including increased LFT's, AKI, dehydration, hypernatremia.  Found to have cardiomyopathy with EF of 25-30%.  Patient also with history of ETOH abuse and narcotic use therefore there may be some component of withdrawal as well.  Head CT on 12/10 shows no acute changes.  Improved focality on neurological examination today.  MRI of the brain reviewed and shows no acute changes.  EEG pending.      Recommendations: 1. EEG today   LOS: 8 days   Thana Farr, MD Neurology [phone] 05/01/2019  9:01 AM
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
## 2019-05-01 NOTE — Progress Notes (Signed)
Pt took meds crushed in applesauce, only eating bites and taking sips

## 2019-05-02 DIAGNOSIS — F1010 Alcohol abuse, uncomplicated: Secondary | ICD-10-CM | POA: Diagnosis present

## 2019-05-02 DIAGNOSIS — F10239 Alcohol dependence with withdrawal, unspecified: Secondary | ICD-10-CM

## 2019-05-02 LAB — BASIC METABOLIC PANEL
Anion gap: 11 (ref 5–15)
BUN: 42 mg/dL — ABNORMAL HIGH (ref 8–23)
CO2: 34 mmol/L — ABNORMAL HIGH (ref 22–32)
Calcium: 8.6 mg/dL — ABNORMAL LOW (ref 8.9–10.3)
Chloride: 105 mmol/L (ref 98–111)
Creatinine, Ser: 1.1 mg/dL (ref 0.61–1.24)
GFR calc Af Amer: >60 mL/min (ref >60)
GFR calc non Af Amer: >60 mL/min (ref >60)
Glucose, Bld: 132 mg/dL — ABNORMAL HIGH (ref 70–99)
Potassium: 3.5 mmol/L (ref 3.5–5.1)
Sodium: 150 mmol/L — ABNORMAL HIGH (ref 135–145)

## 2019-05-02 MED ORDER — LOPERAMIDE HCL 2 MG PO CAPS: 2.0000 mg | ORAL_CAPSULE | ORAL | Status: DC | PRN

## 2019-05-02 MED ORDER — THIAMINE HCL 100 MG PO TABS
100.0000 mg | ORAL_TABLET | Freq: Every day | ORAL | Status: DC
  Administered 2019-05-03 – 2019-05-05 (×3): 100 mg via ORAL
  Filled 2019-05-02 (×4): qty 1

## 2019-05-02 MED ORDER — CHLORDIAZEPOXIDE HCL 5 MG PO CAPS
5.0000 mg | ORAL_CAPSULE | Freq: Two times a day (BID) | ORAL | Status: DC
  Administered 2019-05-02: 5 mg via ORAL
  Filled 2019-05-02: qty 1

## 2019-05-02 MED ORDER — LORAZEPAM 1 MG PO TABS: 1.0000 mg | ORAL_TABLET | Freq: Every day | ORAL | Status: DC

## 2019-05-02 MED ORDER — LORAZEPAM 1 MG PO TABS: 1.0000 mg | ORAL_TABLET | Freq: Three times a day (TID) | ORAL | Status: DC

## 2019-05-02 MED ORDER — LORAZEPAM 1 MG PO TABS: 1.0000 mg | ORAL_TABLET | Freq: Four times a day (QID) | ORAL | Status: DC

## 2019-05-02 MED ORDER — LORAZEPAM 1 MG PO TABS: 1.0000 mg | ORAL_TABLET | Freq: Two times a day (BID) | ORAL | Status: DC

## 2019-05-02 MED ORDER — ONDANSETRON 4 MG PO TBDP
4.0000 mg | ORAL_TABLET | Freq: Four times a day (QID) | ORAL | Status: DC | PRN
  Filled 2019-05-02: qty 1

## 2019-05-02 MED ORDER — HYDROXYZINE HCL 25 MG PO TABS
25.0000 mg | ORAL_TABLET | Freq: Four times a day (QID) | ORAL | Status: DC | PRN
  Filled 2019-05-02 (×2): qty 1

## 2019-05-02 MED ORDER — LORAZEPAM 1 MG PO TABS: 1.0000 mg | ORAL_TABLET | Freq: Four times a day (QID) | ORAL | Status: DC | PRN

## 2019-05-02 NOTE — Progress Notes (Signed)
Nsg reports pt is tolerating current dysphagia I doet with thin liquids when is alert enough to take. Tolerating meds well. Continue with current diet. Possible upgrade if mental status improves.

## 2019-05-02 NOTE — Progress Notes (Signed)
[facility] [place] NAME: Joshua Petersen    MR#:  [id number]  DATE OF BIRTH:  Oct 17, 1955  SUBJECTIVE:  More awake--per RN did drink ensure today Asking me to bring ice water Overall weak REVIEW OF SYSTEMS:   Review of Systems  Unable to perform ROS: Medical condition     DRUG ALLERGIES:  No Known Allergies  VITALS:  Blood pressure 134/84, pulse 74, temperature 97.7 F (36.5 C), temperature source Oral, resp. rate 16, height 5\' 8"  (1.727 m), weight 126.4 kg, SpO2 99 %.  PHYSICAL EXAMINATION:   Physical Examlimited  GENERAL:  63 y.o.-year-old patient lying in the bed with no acute distress. obese EYES: Pupils equal, round, reactive to light and accommodation. No scleral icterus.  HEENT: Head atraumatic, normocephalic.  Oral mucosa dry NECK:  Supple, no jugular venous distention. No thyroid enlargement, no tenderness.  LUNGS: Normal breath sounds bilaterally, no wheezing, rales, rhonchi. No use of accessory muscles of respiration.  CARDIOVASCULAR: S1, S2 normal. No murmurs, rubs, or gallops.  ABDOMEN: Soft, nontender, nondistended. Bowel sounds present. No organomegaly or mass.  EXTREMITIES: No cyanosis, clubbing or edema b/l.    NEUROLOGIC: moves spontaneously extremities. Unable to participate in exam Globally deconditioned PSYCHIATRIC:  patient means lethargic although opens eyes temporarily on verbal command  SKIN: exam RN  LABORATORY PANEL:  CBC Recent Labs  Lab 04/28/19 0502  WBC 14.3*  HGB 14.1  HCT 41.5  PLT 197    Chemistries  Recent Labs  Lab 04/28/19 0502 05/02/19 0538  NA 146* 150*  K 4.1 3.5  CL 107 105  CO2 26 34*  GLUCOSE 111* 132*  BUN 45* 42*  CREATININE 1.42* 1.10  CALCIUM 8.9 8.6*  MG 1.7  --    Cardiac Enzymes No results for input(s): TROPONINI in the last 168 hours. RADIOLOGY:  EEG  Result Date: 05/01/2019 [name], MD     05/01/2019  1:52 PM ELECTROENCEPHALOGRAM REPORT Patient:  Joshua Petersen       Room #: 118A-AA EEG No. ID: [id number] Age: 63 y.o.        Sex: male Referring Physician: Posey Pronto Report Date:  05/01/2019       Interpreting Physician: [name] History: Joshua Petersen is an 63 y.o. male with altered mental status Medications: Lipitor, Thiamine, Coreg, Suboxone, Librium, Proscar, Zestril, Flomax Conditions of Recording:  This is a 21 channel routine scalp EEG performed with bipolar and monopolar montages arranged in accordance to the international 10/20 system of electrode placement. One channel was dedicated to EKG recording. The patient is in the drowsy state. Description:  The background activity appears to represent drowse for the majority of the recording.  The background is slow and poorly organized consisting of a mixture of low voltage theta and delta activity.  Some superimposed beta activity is noted at times.  The background does activate with movement.   No epileptiform activity is noted.  There is no evidence of stage II sleep. Hyperventilation was not performed.  Intermittent photic stimulation was performed but failed to illicit any change in the tracing.  IMPRESSION: This is a normal drowsy electroencephalogram with activation procedures. There are no focal lateralizing or epileptiform features. [name], MD Neurology [phone] 05/01/2019, 1:48 PM   MR BRAIN WO CONTRAST  Result Date: 04/30/2019 CLINICAL DATA:  Follow-up stroke.  Altered mental status. EXAM: MRI HEAD WITHOUT CONTRAST TECHNIQUE: Multiplanar, multiecho pulse sequences of the brain and surrounding structures were  obtained without intravenous contrast. COMPARISON:  Head CT 04/23/2019 FINDINGS: Brain: Diffusion imaging does not show any acute or subacute infarction or other cause of restricted diffusion. No brainstem or cerebellar abnormality is seen. Cerebral hemispheres show moderate chronic small-vessel ischemic changes of the white matter. No large vessel territory infarction. No mass  lesion, hemorrhage, hydrocephalus or extra-axial collection. Vascular: Major vessels at the base of the brain show flow. Skull and upper cervical spine: Negative Sinuses/Orbits: Clear/normal Other: None IMPRESSION: No acute or reversible finding. Moderate chronic small-vessel ischemic changes of the cerebral hemispheric white matter. Electronically Signed   By: Paulina Fusi M.D.   On: 04/30/2019 17:01   ASSESSMENT AND PLAN:  Joshua Petersen is a 63 y.o. male with medical history significant of HTN, chronic low back pain, called EMS and was brought to the hospital for generalized body aches.  He was in the waiting room for a few hours and on intake he was found to be slumped over barely responsive.  *Acute toxic metabolic encephalopathy ?etio unclear  - Continue supportive care -he does not communicate much--now saying one sentence at a time. Was asking the RN about his truck -Not on any sedating meds--d/c [place] (pt here for 9 days) -requesting neurology Dr. Thad Ranger -- appreciate input -MRI brain negative -EEG no epileptiform activity -continue PO thiamine, multivitamin and Folate  *Acute kidney injury: Improving but creatinine is back to baseline.   -Creatinine was 0.81 on 04/17/2019.   -No hydronephrosis or obstruction noted on renal ultrasound.   -creatinine up--d/c lasix   *Acute systolic heart failure fluid overload/acute pulmonary edema/sinus tachycardia -2D echo on 04/27/2019 showed EF of 25 to 30%.   -cont IV metoprolol today to control his heart rate because he refused to swallow his pills including carvedilol.    -Consulted [facility] cardiologist.  -low probability of PE on V/Q scan  -received IV lasix with good UOP 15 liters--d/c lasix since pt's is not taking anything po and sodium up -wean oxygen as tolerated  *BPH with acute urinary retention - Foley catheter in place.   -Continue finasteride and Flomax   *Hypernatremia secondary to poor PO intake  -discussed with dietitian.   -now on IV d5w -sodium 143--150--155--146--150 -pt eating some  *Alcohol abuse with alcohol withdrawal syndrome? Depression ? Failure to thrive ?organic brain syndrome -palliative care to see patient. Overall poor prognosis --trial of high dose thiamine  -pt verbalizing some, more awake  *Hypertension:  -po coreg and lisinopril  *Debility/ ? Critical illness myopathy: - PT and OT evaluation  - Palliative care consultation placed-- input appreciated  Spoke with son on phone yday  Procedures:none Family communication : none consults : cardiology, psychiatry, neurology  Discharge Disposition :TBD CODE STATUS: FULL DVT Prophylaxis :lovenox  TOTAL TIME TAKING CARE OF THIS PATIENT: *25 minutes.  >50% time spent on counselling and coordination of care  POSSIBLE D/C IN **?* DAYS, DEPENDING ON CLINICAL CONDITION.  Note: This dictation was prepared with Dragon dictation along with smaller phrase technology. Any transcriptional errors that result from this process are unintentional.  Enedina Finner M.D on 05/02/2019 at 11:53 AM  Between 7am to 6pm - Pager - [phone]  After 6pm go to [email or web address]  [facility]   CC: Primary care physician; Dione Housekeeper, MDPatient ID: Joshua Petersen, male   DOB: [birth year], 63 y.o.   MRN: [id number]

## 2019-05-02 NOTE — Evaluation (Signed)
Occupational Therapy Evaluation Patient Details Name: Joshua Petersen MRN: [id number] DOB: 11-Apr-1956 Today's Date: 05/02/2019    History of Present Illness 63 y.o. male with medical history significant of HTN, chronic low back pain, called EMS and was brought to the hospital for generalized body aches.  He was in the waiting room for a few hours and on intake he was found to be slumped over barely responsive. Admitted to ICU initially with acute toxic metabolic encephalopathy, acute kidney injury, acute systolic heart failure fluid overload/acute pulm ademea/sinus tachycardia. Patient also with history of ETOH abuse and narcotic use. S/p R TKA in 11/2018.   Clinical Impression   Pt seen for OT evaluation today. Pt lethargic, does open eyes to therapist's voice but unable to maintain alertness during session without verbal and tactile cues. Pt able to answer yes/no questions some of the time, difficulty with either/or and multiple answer or open ended questions. Additional time to process prior to response. Intermittent ability to follow simple 1 step commands. Difficulty with initiation of activity on demand, does voluntarily reach for remote with LUE but then rests arm on bed and unable to replicate when asked. With set up of wash cloth, pt brought to face with R hand but required Mod to Max Assist to complete grooming task. Pt demonstrates significant impairments in strength, cognition, activity tolerance, balance, and currently requires significant assist for ADL and any mobility tasks attempted. Pt will require extensive rehab to maximize potential return to PLOF, minimize falls risk, caregiver burden, and minimize readmission. Recommend SNF.    Follow Up Recommendations  SNF    Equipment Recommendations  3 in 1 bedside commode(bariatric BSC, drop arm)    Recommendations for Other Services       Precautions / Restrictions Precautions Precautions: Fall Restrictions Weight Bearing  Restrictions: No      Mobility Bed Mobility               General bed mobility comments: total assist, at least 2 assist  Transfers                 General transfer comment: unsafe to attempt    Balance                                           ADL either performed or assessed with clinical judgement   ADL Overall ADL's : Needs assistance/impaired Eating/Feeding: Bed level;Maximal assistance;Moderate assistance   Grooming: Bed level;Moderate assistance;Maximal assistance Grooming Details (indicate cue type and reason): with set up of warm washcloth, pt brought to face with R hand briefly to wipe eyes, required Mod A to complete, would require more assist to comb hair or perform bilat tasks Upper Body Bathing: Bed level;Maximal assistance   Lower Body Bathing: Bed level;Total assistance   Upper Body Dressing : Bed level;Maximal assistance   Lower Body Dressing: Bed level;Total assistance     Toilet Transfer Details (indicate cue type and reason): unsafe to attempt                 Vision   Additional Comments: Pt unable to verbalize visual baseline/possible deficits, does visually track therapist in room     Perception     Praxis      Pertinent Vitals/Pain Pain Assessment: Faces Faces Pain Scale: No hurt     Hand Dominance     Extremity/Trunk Assessment  Upper Extremity Assessment Upper Extremity Assessment: Generalized weakness;Difficult to assess due to impaired cognition(pt initially able to squeeze hands with 4/5 grip strength, but unable to replicate upon request, won't raise arms upon command, voluntarily raises L arm to reach for remote on tray table but appears to have difficulty with motor planning)   Lower Extremity Assessment Lower Extremity Assessment: Difficult to assess due to impaired cognition;Generalized weakness       Communication Communication Communication: No difficulties   Cognition  Arousal/Alertness: Lethargic Behavior During Therapy: Flat affect Overall Cognitive Status: Impaired/Different from baseline                                 General Comments: Pt lethargic, opens eyes briefly to cues, able to answer <50% of yes/no questions, difficulty with either/or and multiple choice questions, difficulty with following commands, difficulty to more formally assess cognition   General Comments       Exercises     Shoulder Instructions      Home Living Family/patient expects to be discharged to:: Private residence Living Arrangements: Alone   Type of Home: Mobile home Home Access: Stairs to enter Entrance Stairs-Number of Steps: 6 Entrance Stairs-Rails: Right;Left;Can reach both Home Layout: One level     Bathroom Shower/Tub: Occupational psychologist: Standard         Additional Comments: Pt unable to provide history; all history pulled from recent admission in 11/2018 for R TKA.      Prior Functioning/Environment Level of Independence: Independent with assistive device(s)        Comments: Pt Ind with amb without an AD mostly in the home but would often use one crutch in the community, no falls, Ind with ADLs        OT Problem List: Decreased strength;Decreased coordination;Decreased cognition;Decreased activity tolerance;Obesity;Impaired UE functional use;Impaired balance (sitting and/or standing);Decreased knowledge of use of DME or AE      OT Treatment/Interventions: Therapeutic exercise;Self-care/ADL training;Therapeutic activities;Cognitive remediation/compensation;Neuromuscular education;DME and/or AE instruction;Patient/family education;Balance training    OT Goals(Current goals can be found in the care plan section) Acute Rehab OT Goals Patient Stated Goal: unable to state OT Goal Formulation: Patient unable to participate in goal setting Time For Goal Achievement: 05/16/19 Potential to Achieve Goals: Fair ADL  Goals Pt Will Perform Grooming: with min assist;bed level Additional ADL Goal #1: Pt will perform rolling in bed for daily care tasks with Mod A. Additional ADL Goal #2: Pt will perform 2 step task with 1 cue to initiate/sequence.  OT Frequency: Min 1X/week   Barriers to D/C: Decreased caregiver support          Co-evaluation              AM-PAC OT "6 Clicks" Daily Activity     Outcome Measure Help from another person eating meals?: A Lot Help from another person taking care of personal grooming?: A Lot Help from another person toileting, which includes using toliet, bedpan, or urinal?: Total Help from another person bathing (including washing, rinsing, drying)?: Total Help from another person to put on and taking off regular upper body clothing?: A Lot Help from another person to put on and taking off regular lower body clothing?: Total 6 Click Score: 9   End of Session    Activity Tolerance: Patient limited by lethargy Patient left: in bed;with call bell/phone within reach;with bed alarm set  OT Visit Diagnosis: Other abnormalities  of gait and mobility (R26.89);Muscle weakness (generalized) (M62.81);Other symptoms and signs involving cognitive function                Time: 4268-3419 OT Time Calculation (min): 12 min Charges:  OT General Charges $OT Visit: 1 Visit OT Evaluation $OT Eval High Complexity: 1 High  Richrd Prime, MPH, MS, OTR/L ascom [phone] 05/02/19, 5:02 PM
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
## 2019-05-02 NOTE — Progress Notes (Signed)
Subjective: Patient awake and alert.  Remains altered but more interactive.  Objective: Current vital signs: BP 134/84 (BP Location: Left Arm)   Pulse 74   Temp 97.7 F (36.5 C) (Oral)   Resp 16   Ht 5\' 8"  (1.727 m)   Wt 126.4 kg   SpO2 99%   BMI 42.37 kg/m  Vital signs in last 24 hours: Temp:  [97.7 F (36.5 C)-97.9 F (36.6 C)] 97.7 F (36.5 C) (12/19 0546) Pulse Rate:  [71-88] 74 (12/19 0546) Resp:  [16-20] 16 (12/19 0546) BP: (122-134)/(68-84) 134/84 (12/19 0546) SpO2:  [95 %-99 %] 99 % (12/19 0546)  Intake/Output from previous day: 12/18 0701 - 12/19 0700 In: 1524.8 [I.V.:1524.8] Out: 525 [Urine:525] Intake/Output this shift: Total I/O In: 329.9 [I.V.:329.9] Out: -  Nutritional status:  Diet Order            DIET - DYS 1 Room service appropriate? Yes; Fluid consistency: Thin  Diet effective now              Neurologic Exam: Mental Status: Alert.  Speaking more.  Expresses wants.  Follows some simple commands. Cranial Nerves: II: Blinks to bilateral confrontation.   III,IV, VI: Extra-ocular motions intact bilaterally V,VII: smile symmetric, facial light touch sensation normal bilaterally VIII: hearing normal bilaterally IX,X: gag reflex present XI: bilateral shoulder shrug XII: midline tongue extension Motor: Moving all extremities against gravity Sensory: Pinprick and light touch intact throughout, bilaterally  Lab Results: Basic Metabolic Panel: Recent Labs  Lab 04/27/19 0928 04/28/19 0502 04/29/19 0505 04/30/19 0457 04/30/19 1356 05/02/19 0538  NA 143 146* 150* 153* 155* 150*  K 4.4 4.1 3.8 3.6  --  3.5  CL 107 107 111 112*  --  105  CO2 20* 26 23 31   --  34*  GLUCOSE 146* 111* 161* 116*  --  132*  BUN 41* 45* 49* 54*  --  42*  CREATININE 1.71* 1.42* 1.19 1.35*  --  1.10  CALCIUM 8.9 8.9 9.1 8.9  --  8.6*  MG  --  1.7  --   --   --   --   PHOS  --  3.3  --   --  4.6  --     Liver Function Tests: No results for input(s): AST, ALT,  ALKPHOS, BILITOT, PROT, ALBUMIN in the last 168 hours. No results for input(s): LIPASE, AMYLASE in the last 168 hours. Recent Labs  Lab 04/30/19 1356  AMMONIA 38*    CBC: Recent Labs  Lab 04/27/19 0928 04/28/19 0502  WBC 13.7* 14.3*  HGB 15.4 14.1  HCT 46.6 41.5  MCV 85.0 82.8  PLT 166 197    Cardiac Enzymes: No results for input(s): CKTOTAL, CKMB, CKMBINDEX, TROPONINI in the last 168 hours.  Lipid Panel: Recent Labs  Lab 04/28/19 1555  CHOL 156  TRIG 115  HDL 58  CHOLHDL 2.7  VLDL 23  LDLCALC 75    CBG: Recent Labs  Lab 04/28/19 0759 04/28/19 1206 04/28/19 1633  GLUCAP 104* 151* 120*    Microbiology: Results for orders placed or performed during the hospital encounter of 04/23/19  Blood Culture (routine x 2)     Status: None   Collection Time: 04/23/19 12:30 AM   Specimen: BLOOD  Result Value Ref Range Status   Specimen Description BLOOD LEFT [facility]  Final   Special Requests   Final    BOTTLES DRAWN AEROBIC AND ANAEROBIC Blood Culture adequate volume   Culture   Final  NO GROWTH 5 DAYS Performed at [facility], [address] [place], [place] [address]    Report Status 04/28/2019 FINAL  Final  Blood Culture (routine x 2)     Status: None   Collection Time: 04/23/19  3:35 AM   Specimen: BLOOD  Result Value Ref Range Status   Specimen Description BLOOD RIGHT HAND  Final   Special Requests   Final    BOTTLES DRAWN AEROBIC AND ANAEROBIC Blood Culture adequate volume   Culture   Final    NO GROWTH 5 DAYS Performed at [facility], [address] [place] [address], [place], [place] [address]    Report Status 04/28/2019 FINAL  Final  Urine culture     Status: None   Collection Time: 04/23/19  4:21 AM   Specimen: Urine, Clean Catch  Result Value Ref Range Status   Specimen Description   Final    URINE, CLEAN CATCH Performed at [facility], [address], [place], [place] [address]    Special Requests   Final    NONE Performed at  [facility], [address], [place], [place] [address]    Culture   Final    NO GROWTH Performed at [facility], [place] [address], [place], [place] [address]    Report Status 04/24/2019 FINAL  Final  SARS CORONAVIRUS 2 (TAT 6-24 HRS) Nasopharyngeal Nasopharyngeal Swab     Status: None   Collection Time: 04/23/19  5:30 AM   Specimen: Nasopharyngeal Swab  Result Value Ref Range Status   SARS Coronavirus 2 NEGATIVE NEGATIVE Final    Comment: (NOTE) SARS-CoV-2 target nucleic acids are NOT DETECTED. The SARS-CoV-2 RNA is generally detectable in upper and lower respiratory specimens during the acute phase of infection. Negative results do not preclude SARS-CoV-2 infection, do not rule out co-infections with other pathogens, and should not be used as the sole basis for treatment or other patient management decisions. Negative results must be combined with clinical observations, patient history, and epidemiological information. The expected result is Negative. Fact Sheet for Patients: SugarRoll.be Fact Sheet for Healthcare Providers: https://www.woods-mathews.com/ This test is not yet approved or cleared by the [place] FDA and  has been authorized for detection and/or diagnosis of SARS-CoV-2 by FDA under an Emergency Use Authorization (EUA). This EUA will remain  in effect (meaning this test can be used) for the duration of the COVID-19 declaration under Section 56 4(b)(1) of the Act, 21 U.S.C. section 360bbb-3(b)(1), unless the authorization is terminated or revoked sooner. Performed at [facility], [place] [address] [place], [place] [address]   MRSA PCR Screening     Status: None   Collection Time: 04/23/19  9:29 AM   Specimen: Nasopharyngeal  Result Value Ref Range Status   MRSA by PCR NEGATIVE NEGATIVE Final    Comment:        The GeneXpert MRSA Assay (FDA approved for NASAL specimens only), is one component  of a comprehensive MRSA colonization surveillance program. It is not intended to diagnose MRSA infection nor to guide or monitor treatment for MRSA infections. Performed at [facility], [address] [place], [place] [address]     Coagulation Studies: No results for input(s): LABPROT, INR in the last 72 hours.  Imaging: EEG  Result Date: 05/01/2019 [name], MD     05/01/2019  1:52 PM ELECTROENCEPHALOGRAM REPORT Patient: Joshua Petersen       Room #: 118A-AA EEG No. ID: [id number] Age:  63 y.o.        Sex: male Referring Physician: Allena KatzPatel Report Date:  05/01/2019       Interpreting Physician: Thana FarrEYNOLDS, Murline Weigel History: Ebony HailKeith E Weinheimer is an 63 y.o. male with altered mental status Medications: Lipitor, Thiamine, Coreg, Suboxone, Librium, Proscar, Zestril, Flomax Conditions of Recording:  This is a 21 channel routine scalp EEG performed with bipolar and monopolar montages arranged in accordance to the international 10/20 system of electrode placement. One channel was dedicated to EKG recording. The patient is in the drowsy state. Description:  The background activity appears to represent drowse for the majority of the recording.  The background is slow and poorly organized consisting of a mixture of low voltage theta and delta activity.  Some superimposed beta activity is noted at times.  The background does activate with movement.   No epileptiform activity is noted.  There is no evidence of stage II sleep. Hyperventilation was not performed.  Intermittent photic stimulation was performed but failed to illicit any change in the tracing.  IMPRESSION: This is a normal drowsy electroencephalogram with activation procedures. There are no focal lateralizing or epileptiform features. Thana FarrLeslie Rilie Glanz, MD Neurology [phone] 05/01/2019, 1:48 PM   MR BRAIN WO CONTRAST  Result Date: 04/30/2019 CLINICAL DATA:  Follow-up stroke.  Altered mental status. EXAM: MRI HEAD WITHOUT CONTRAST TECHNIQUE:  Multiplanar, multiecho pulse sequences of the brain and surrounding structures were obtained without intravenous contrast. COMPARISON:  Head CT 04/23/2019 FINDINGS: Brain: Diffusion imaging does not show any acute or subacute infarction or other cause of restricted diffusion. No brainstem or cerebellar abnormality is seen. Cerebral hemispheres show moderate chronic small-vessel ischemic changes of the white matter. No large vessel territory infarction. No mass lesion, hemorrhage, hydrocephalus or extra-axial collection. Vascular: Major vessels at the base of the brain show flow. Skull and upper cervical spine: Negative Sinuses/Orbits: Clear/normal Other: None IMPRESSION: No acute or reversible finding. Moderate chronic small-vessel ischemic changes of the cerebral hemispheric white matter. Electronically Signed   By: Paulina FusiMark  Shogry M.D.   On: 04/30/2019 17:01    Medications:  I have reviewed the patient's current medications. Scheduled: . atorvastatin  40 mg Oral QHS  . buprenorphine-naloxone  1 tablet Sublingual Daily  . carvedilol  12.5 mg Oral BID WC  . Chlorhexidine Gluconate Cloth  6 each Topical Daily  . enoxaparin (LOVENOX) injection  40 mg Subcutaneous Q24H  . feeding supplement (ENSURE ENLIVE)  237 mL Oral BID BM  . finasteride  5 mg Oral Daily  . folic acid  1 mg Oral Daily  . lisinopril  5 mg Oral Daily  . LORazepam  1 mg Oral QID   Followed by  . [START ON 05/03/2019] LORazepam  1 mg Oral TID   Followed by  . [START ON 05/04/2019] LORazepam  1 mg Oral BID   Followed by  . [START ON 05/05/2019] LORazepam  1 mg Oral Daily  . mouth rinse  15 mL Mouth Rinse BID  . multivitamin with minerals  1 tablet Oral Daily  . pantoprazole  40 mg Oral Daily  . tamsulosin  0.8 mg Oral Daily  . [START ON 05/03/2019] thiamine  100 mg Oral Daily  . thiamine  500 mg Oral Daily    Assessment/Plan: 63 y.o.malewho is unable to provide any history due to being nonverbal. All history obtained from  the chart. Patient with medical history significant ofHTN, chronic low back pain, called EMS and was brought to the hospital for generalized body acheson  12/9. He was in the waiting room for a few hours and on intake he was found to be slumped over barely responsive. He received Narcan with improvement in his mental status and was placed on a Narcan drip. An ABG showed hypercarbic respiratory failure with respiratory acidosis and he was placed on BiPAP.Patient now off BiPAP. Remains altered. Has had multiple metabolic issues including increased LFT's, AKI, dehydration, hypernatremia. Found to have cardiomyopathy with EF of 25-30%. Patient also with history of ETOH abuse and narcotic use therefore there may be some component of withdrawal as well. Head CT on 12/10 shows no acute changes. Improved focality on neurological examination today.  MRI of the brain reviewed and shows no acute changes.  EEG shows no epileptiform activity.     Recommendations: 1. Patient continuing to improve slowly.  Do not expect to return to baseline but may show further improvement.  No further neurologic intervention is recommended at this time.  If further questions arise, please call or page at that time.  Thank you for allowing neurology to participate in the care of this patient.   LOS: 9 days   Thana Farr, MD Neurology [phone] 05/02/2019  10:32 AM

## 2019-05-03 ENCOUNTER — Encounter: Payer: Self-pay | Admitting: Internal Medicine

## 2019-05-03 DIAGNOSIS — R531 Weakness: Secondary | ICD-10-CM

## 2019-05-03 LAB — GLUCOSE, CAPILLARY: Glucose-Capillary: 140 mg/dL — ABNORMAL HIGH (ref 70–99)

## 2019-05-03 LAB — SODIUM: Sodium: 144 mmol/L (ref 135–145)

## 2019-05-03 NOTE — Progress Notes (Signed)
[facility] [place] NAME: Joshua Petersen    MR#:  [id number]  DATE OF BIRTH:  May 05, 1956  SUBJECTIVE:  More awake--per RN did drink ensure  And drinking water Overall weak, although able to raise his arms close to the shoulder level. Trying to flex his knees. Wants to talk to his son on the phone REVIEW OF SYSTEMS:   Review of Systems  Unable to perform ROS: Medical condition     DRUG ALLERGIES:  No Known Allergies  VITALS:  Blood pressure (!) 144/98, pulse 65, temperature 97.7 F (36.5 C), temperature source Oral, resp. rate 18, height 5\' 8"  (1.727 m), weight 126.4 kg, SpO2 95 %.  PHYSICAL EXAMINATION:   Physical Examlimited  GENERAL:  63 y.o.-year-old patient lying in the bed with no acute distress. obese EYES: Pupils equal, round, reactive to light and accommodation. No scleral icterus.  HEENT: Head atraumatic, normocephalic.  Oral mucosa dry NECK:  Supple, no jugular venous distention. No thyroid enlargement, no tenderness.  LUNGS: Normal breath sounds bilaterally, no wheezing, rales, rhonchi. No use of accessory muscles of respiration.  CARDIOVASCULAR: S1, S2 normal. No murmurs, rubs, or gallops.  ABDOMEN: Soft, nontender, nondistended. Bowel sounds present. No organomegaly or mass.  EXTREMITIES: No cyanosis, clubbing or edema b/l.    NEUROLOGIC: moves spontaneously extremities. Unable to participate in full exam Globally deconditioned PSYCHIATRIC: awake some baseline confusion all the response to verbal command and answers basic questions.  SKIN: exam per RN  LABORATORY PANEL:  CBC Recent Labs  Lab 04/28/19 0502  WBC 14.3*  HGB 14.1  HCT 41.5  PLT 197    Chemistries  Recent Labs  Lab 04/28/19 0502 05/02/19 0538 05/03/19 0832  NA 146* 150* 144  K 4.1 3.5  --   CL 107 105  --   CO2 26 34*  --   GLUCOSE 111* 132*  --   BUN 45* 42*  --   CREATININE 1.42* 1.10  --   CALCIUM 8.9 8.6*  --   MG 1.7  --   --     Cardiac Enzymes No results for input(s): TROPONINI in the last 168 hours. RADIOLOGY:  EEG  Result Date: 05/01/2019 [name], MD     05/01/2019  1:52 PM ELECTROENCEPHALOGRAM REPORT Patient: Joshua Petersen       Room #: 118A-AA EEG No. ID: [id number] Age: 63 y.o.        Sex: male Referring Physician: Posey Pronto Report Date:  05/01/2019       Interpreting Physician: [name] History: Joshua Petersen is an 63 y.o. male with altered mental status Medications: Lipitor, Thiamine, Coreg, Suboxone, Librium, Proscar, Zestril, Flomax Conditions of Recording:  This is a 21 channel routine scalp EEG performed with bipolar and monopolar montages arranged in accordance to the international 10/20 system of electrode placement. One channel was dedicated to EKG recording. The patient is in the drowsy state. Description:  The background activity appears to represent drowse for the majority of the recording.  The background is slow and poorly organized consisting of a mixture of low voltage theta and delta activity.  Some superimposed beta activity is noted at times.  The background does activate with movement.   No epileptiform activity is noted.  There is no evidence of stage II sleep. Hyperventilation was not performed.  Intermittent photic stimulation was performed but failed to illicit any change in the tracing.  IMPRESSION: This is a normal drowsy electroencephalogram  with activation procedures. There are no focal lateralizing or epileptiform features. Thana Farr, MD Neurology [phone] 05/01/2019, 1:48 PM   ASSESSMENT AND PLAN:  KALETH KOY is a 63 y.o. male with medical history significant of HTN, chronic low back pain, called EMS and was brought to the hospital for generalized body aches.  He was in the waiting room for a few hours and on intake he was found to be slumped over barely responsive.  *Acute toxic metabolic encephalopathy ?etio unclear  - Continue supportive care -more awake and having  some meaningful conversation. Wants to eat vanilla ice cream -Not on any sedating meds--d/c [facility] (pt here for >7  days) - neurology Dr. Thad Ranger -- appreciate input -MRI brain negative -EEG no epileptiform activity -continue PO thiamine, multivitamin and Folate  *Acute kidney injury: Improving but creatinine is back to baseline.   -Creatinine was 0.81 on 04/17/2019.   -No hydronephrosis or obstruction noted on renal ultrasound.   -creatinine up--d/c lasix   *Acute systolic heart failure fluid overload/acute pulmonary edema/sinus tachycardia -2D echo on 04/27/2019 showed EF of 25 to 30%.   -Consulted [facility] cardiologist.  -low probability of PE on V/Q scan  -received IV lasix with good UOP 15 liters--d/c lasix since pt's is not taking anything po and sodium up -wean oxygen as tolerated -on coreg and lisinopril  *BPH with acute urinary retention - Foley catheter in place.   -Continue finasteride and Flomax   *Hypernatremia secondary to poor PO intake  -discussed with dietitian.  -sodium 143--150--155--146--150--144--d/c IV D5W -pt eating some  *Alcohol abuse with alcohol withdrawal syndrome? Depression ? Failure to thrive ?organic brain syndrome -palliative care to see patient. Overall poor prognosis --trial of high dose thiamine  -pt verbalizing some, more awake  *Hypertension:  -po coreg and lisinopril  *Debility/ ? Critical illness myopathy - PT and OT evaluation  - Palliative care consultation placed-- input appreciated  TOC consulted waiting to be seen--for d/c planning to SNF Spoke with son on phone today  Procedures:none Family communication : son [name] consults : cardiology, psychiatry, neurology  Discharge Disposition :TBD CODE STATUS: FULL DVT Prophylaxis :lovenox  TOTAL TIME TAKING CARE OF THIS PATIENT: *25 minutes.  >50% time spent on counselling and coordination of care  POSSIBLE D/C IN **?* DAYS, DEPENDING ON CLINICAL CONDITION.  Note: This  dictation was prepared with Dragon dictation along with smaller phrase technology. Any transcriptional errors that result from this process are unintentional.  Enedina Finner M.D on 05/03/2019 at 11:37 AM  Between 7am to 6pm - Pager - [phone]  After 6pm go to [email or web address]  [facility]   CC: Primary care physician; Joshua Petersen, MDPatient ID: Joshua Petersen, male   DOB: Dec 13, 1955, 63 y.o.   MRN: [id number]

## 2019-05-03 NOTE — Progress Notes (Signed)
Physical Therapy Evaluation Patient Details Name: Joshua Petersen MRN: [id number] DOB: [birth year] Today's Date: 05/03/2019   History of Present Illness  63 y.o. male with medical history significant of HTN, chronic low back pain, called EMS and was brought to the hospital for generalized body aches.  He was in the waiting room for a few hours and on intake he was found to be slumped over barely responsive. Admitted to ICU initially with acute toxic metabolic encephalopathy, acute kidney injury, acute systolic heart failure fluid overload/acute pulm ademea/sinus tachycardia. Patient also with history of ETOH abuse and narcotic use. S/p R TKA in 11/2018.  Clinical Impression  Patient agrees to PT eval. He has 2/5 strength BLE hips, knees and ankles. He responds to 50% of verbal commands correctly with slow response. He is confused and talks about his son being outside the door. He needs max assist for supine <> sit bed mobility with O2 saturation above 90%.He has fair sitting balance with UE assisted support. He needs max assist to reposition in bed and bed is tilted to assist with scooting up towards the head of the bed. His O2 sat drops to 77% and HR is 27 BPM and patient was not able to respond to PT; unit secretary was called to call for a rapid response but patient's O2 saturation improved and HR returned to 75 BPM. The rapid response team did not need to be called off because the unit secretary misunderstood what the PT needed when she asked for the rapid response team and rapid response was never alerted. Patient will continue to benefit from skilled PT to improve strength and mobility.     Follow Up Recommendations SNF    Equipment Recommendations  Rolling walker with 5" wheels    Recommendations for Other Services       Precautions / Restrictions Restrictions Weight Bearing Restrictions: No      Mobility  Bed Mobility Overal bed mobility: Needs Assistance Bed Mobility: Supine to  Sit;Sit to Supine     Supine to sit: Max assist Sit to supine: Max assist   General bed mobility comments: Patient needs max asssit   Transfers                 General transfer comment: unsafe to attempt  Ambulation/Gait                Stairs            Wheelchair Mobility    Modified Rankin (Stroke Patients Only)       Balance Overall balance assessment: Needs assistance Sitting-balance support: No upper extremity supported Sitting balance-Leahy Scale: Fair                                       Pertinent Vitals/Pain Pain Assessment: No/denies pain Faces Pain Scale: No hurt    Home Living Family/patient expects to be discharged to:: Skilled nursing facility Living Arrangements: Alone;Other (Comment)   Type of Home: Mobile home Home Access: Stairs to enter Entrance Stairs-Rails: Right Entrance Stairs-Number of Steps: 6 Home Layout: One level        Prior Function Level of Independence: Independent with assistive device(s)               Hand Dominance   Dominant Hand: Left    Extremity/Trunk Assessment   Upper Extremity Assessment Upper Extremity Assessment: Generalized weakness  Lower Extremity Assessment Lower Extremity Assessment: Generalized weakness       Communication   Communication: Other (comment)(patient is confused and 50% follows commands)  Cognition Arousal/Alertness: Lethargic Behavior During Therapy: Flat affect Overall Cognitive Status: Impaired/Different from baseline                                 General Comments: Pateint follows commands 50% of the time      General Comments      Exercises     Assessment/Plan    PT Assessment Patient needs continued PT services  PT Problem List Decreased strength;Decreased activity tolerance;Decreased balance;Decreased mobility;Decreased coordination;Decreased cognition;Cardiopulmonary status limiting activity;Obesity        PT Treatment Interventions Gait training;Therapeutic activities;Therapeutic exercise;Balance training    PT Goals (Current goals can be found in the Care Plan section)  Acute Rehab PT Goals Patient Stated Goal: no goals stated PT Goal Formulation: Patient unable to participate in goal setting Time For Goal Achievement: 05/17/19 Potential to Achieve Goals: Fair    Frequency Min 3X/week   Barriers to discharge        Co-evaluation               AM-PAC PT "6 Clicks" Mobility  Outcome Measure Help needed turning from your back to your side while in a flat bed without using bedrails?: Total Help needed moving from lying on your back to sitting on the side of a flat bed without using bedrails?: Total Help needed moving to and from a bed to a chair (including a wheelchair)?: Total Help needed standing up from a chair using your arms (e.g., wheelchair or bedside chair)?: Total Help needed to walk in hospital room?: Total Help needed climbing 3-5 steps with a railing? : Total 6 Click Score: 6    End of Session Equipment Utilized During Treatment: Gait belt;Oxygen Activity Tolerance: Patient limited by fatigue;Patient limited by lethargy Patient left: in bed;with bed alarm set Nurse Communication: Mobility status PT Visit Diagnosis: Muscle weakness (generalized) (M62.81);Difficulty in walking, not elsewhere classified (R26.2)    Time: 1020-1045 PT Time Calculation (min) (ACUTE ONLY): 25 min   Charges:   PT Evaluation $PT Eval Low Complexity: 1 Low PT Treatments $Therapeutic Activity: 8-22 mins          Alanson Puls, PT DPT 05/03/2019, 11:35 AM

## 2019-05-04 ENCOUNTER — Telehealth: Payer: Self-pay | Admitting: Cardiovascular Disease

## 2019-05-04 MED ORDER — POLYETHYLENE GLYCOL 3350 17 G PO PACK
17.0000 g | PACK | Freq: Every day | ORAL | Status: DC
  Administered 2019-05-04 – 2019-05-12 (×5): 17 g via ORAL
  Filled 2019-05-04 (×5): qty 1

## 2019-05-04 MED ORDER — BISACODYL 5 MG PO TBEC
5.0000 mg | DELAYED_RELEASE_TABLET | Freq: Every day | ORAL | Status: DC | PRN
  Administered 2019-05-04 – 2019-05-05 (×2): 5 mg via ORAL
  Filled 2019-05-04 (×2): qty 1

## 2019-05-04 MED ORDER — ENSURE ENLIVE PO LIQD
237.0000 mL | Freq: Three times a day (TID) | ORAL | Status: DC
  Administered 2019-05-04 – 2019-05-12 (×15): 237 mL via ORAL

## 2019-05-04 NOTE — Progress Notes (Signed)
Initial Nutrition Assessment  DOCUMENTATION CODES:   Morbid obesity  INTERVENTION:  Family has decided they would not want a feeding tube for patient.  Increase to Ensure Enlive po TID, each supplement provides 350 kcal and 20 grams of protein.   Continue Magic cup TID with meals, each supplement provides 290 kcal and 9 grams of protein.  NUTRITION DIAGNOSIS:   Inadequate oral intake related to lethargy/confusion as evidenced by meal completion < 25%.  Ongoing.  GOAL:   Patient will meet greater than or equal to 90% of their needs  Not met.  MONITOR:   PO intake, Supplement acceptance, Labs, Weight trends, TF tolerance, I & O's  REASON FOR ASSESSMENT:   Rounds    ASSESSMENT:   63 year old male with PMHx of HTN, GERD, arthritis, hepatitis C, EtOH abuse admitted with acute hypoxemic and hypercapnic respiratory failure, acute toxic metabolic encephalopathy, AKI, acute systolic heart failure, accidental drug overdose, BPH, hypernatremia.  Patient continues to have poor PO intake of meals. He is eating <25% of meals. He is drinking Ensure and having some Magic Cup. According to chart he is also drinking water. Since patient is doing well with Ensure now will increase to TID. According to Palliative Medicine note family has decided they do not want a feeding tube for patient. Diet was advanced to dysphagia 2 today and he continues on thin liquids.  Medications reviewed and include: folic acid 1 mg daily, lisinopril, MVI daily, pantoprazole, Miralax, Flomax, thiamine 100 mg daily.  Labs reviewed.   Diet Order:   Diet Order            DIET DYS 2 Room service appropriate? Yes; Fluid consistency: Thin  Diet effective now             EDUCATION NEEDS:   No education needs have been identified at this time  Skin:  Skin Assessment: Reviewed RN Assessment  Last BM:  04/28/2019 per chart  Height:   Ht Readings from Last 1 Encounters:  04/23/19 _0  (1.727 m)    Weight:   Wt Readings from Last 1 Encounters:  04/23/19 126.4 kg   Ideal Body Weight:  70 kg  BMI:  Body mass index is 42.37 kg/m.  Estimated Nutritional Needs:   Kcal:  2200-2400  Protein:  115-125 grams  Fluid:  1.8-2 L/day  Jacklynn Barnacle, MS, RD, LDN Office: [phone] Pager: [phone] After Hours/Weekend Pager: [phone]

## 2019-05-04 NOTE — Progress Notes (Addendum)
Daily Progress Note   Patient Name: Joshua Petersen       Date: 05/04/2019 DOB: 1955-05-26  Age: 64 y.o. MRN#: [id number] Attending Physician: Enedina Finner, MD Primary Care Physician: Dione Housekeeper, MD Admit Date: 04/23/2019  Reason for Consultation/Follow-up: Establishing goals of care  Subjective: Patient is resting in bed. He is somewhat confused telling me he is widowed and then divorced. He states he wants his son Joshua Petersen to be his Management consultant. We discussed his care. When discussing his appetite and if he would want a feeding tube vs comfort if it does not improve, he states "the door that's gonna get me to heaven." When attempting to clarify if he wanted comfort based care, he could not advise further. He states his son would know what to do. He states he has papers which he has not seen in over 5 years. He cannot advise the decisions in the paperwork, but states Joshua Petersen will know where they are. Spoke with son Joshua Petersen. He states his father has some paperwork, but will need to come get his keys to look at it, and will do so later today. Nursing and secritary made aware.   He states he would not want a feeding tube for his father as that is not what he would want. He states that if his father improves, he would like for his father to go to [facility] for SNF as that is where his mother lives under long term care. He would like palliative medicine to follow outpatient. If after going to [facility] he declines, he would want to shift to hospice comfort care, be tucked in there, and would not want him returned to the hospital. If Joshua Petersen does not improve or declines during this hospitalization, he would like hospice comfort care at a hospice facility. Will continue to monitor.    Length of  Stay: 11  Current Medications: Scheduled Meds:  . atorvastatin  40 mg Oral QHS  . buprenorphine-naloxone  1 tablet Sublingual Daily  . carvedilol  12.5 mg Oral BID WC  . Chlorhexidine Gluconate Cloth  6 each Topical Daily  . enoxaparin (LOVENOX) injection  40 mg Subcutaneous Q24H  . feeding supplement (ENSURE ENLIVE)  237 mL Oral BID BM  . finasteride  5 mg Oral Daily  . folic acid  1 mg Oral Daily  . lisinopril  5 mg Oral Daily  . mouth rinse  15 mL Mouth Rinse BID  . multivitamin with minerals  1 tablet Oral Daily  . pantoprazole  40 mg Oral Daily  . polyethylene glycol  17 g Oral Daily  . tamsulosin  0.8 mg Oral Daily  . thiamine  100 mg Oral Daily    Continuous Infusions:   PRN Meds: albuterol, bisacodyl, hydrOXYzine, loperamide, ondansetron  Physical Exam Constitutional:      Comments: Opens eyes briefly.   Pulmonary:     Effort: Pulmonary effort is normal.  Neurological:     Mental Status: He is alert.     Comments: Somewhat confused but improving.              Vital Signs: BP (!) 155/80 (BP Location: Right Arm)   Pulse 62   Temp 97.6 F (36.4 C) (Oral)   Resp 18   Ht 5\' 8"  (1.727 m)   Wt 126.4 kg   SpO2 97%   BMI 42.37 kg/m  SpO2: SpO2: 97 % O2 Device: O2 Device: Nasal Cannula O2 Flow Rate: O2 Flow Rate (L/min): 2 L/min  Intake/output summary:   Intake/Output Summary (Last 24 hours) at 05/04/2019 0954 Last data filed at 05/04/2019 4854 Gross per 24 hour  Intake 908.65 ml  Output 1750 ml  Net -841.35 ml   LBM: Last BM Date: 04/28/19(MD made aware) Baseline Weight: Weight: 122.5 kg Most recent weight: Weight: 126.4 kg       Palliative Assessment/Data: 40%      Patient Active Problem List   Diagnosis Date Noted  . Generalized weakness   . Alcohol abuse 05/02/2019  . Alcoholic cardiomyopathy (HCC)   . Hypernatremia   . Opioid abuse (HCC)   . Acute metabolic encephalopathy   . Essential hypertension   . Acute systolic CHF (congestive  heart failure) (HCC) 04/28/2019  . Alcohol withdrawal syndrome with complication, with unspecified complication (HCC) 04/24/2019  . Acute respiratory failure with hypoxia and hypercapnia (HCC) 04/24/2019  . AKI (acute kidney injury) (HCC) 04/24/2019  . Drug overdose 04/23/2019  . Status post total knee replacement using cement, right 12/11/2018  . Chronic pain of right knee 06/05/2018  . 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 Primary osteoarthritis of right knee 05/08/2018  . 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 Morbid obesity with BMI of 40.0-44.9, adult (HCC) 12/28/2015    Palliative Care Assessment & Plan    Recommendations/Plan:  Son does not want a feeding tube  If patient improves, son would like [facility], if he does not, he would like Hospice facility placement.   Will continue to monitor status and PO intake.      Code Status:    Code Status Orders  (From admission, onward)         Start     Ordered   04/23/19 0701  Full code  Continuous     04/23/19 0702        Code Status History    Date Active Date Inactive Code Status Order ID Comments User Context   12/11/2018 1131 12/12/2018 1530 Full Code [id number]  Christena Flake, MD Inpatient   03/12/2017 1246 03/15/2017 1756 Full Code [id number]  PoggiExcell Seltzer, MD Inpatient   Advance Care Planning Activity       Prognosis:   < 2 weeks  Depending on oral intake.     Care plan  was discussed with RN  Thank you for allowing the Palliative Medicine Team to assist in the care of this patient.   Time In: 9:50 Time Out: 11:05 Total Time 1 hour 15 min Prolonged Time Billed yes      Greater than 50%  of this time was spent counseling and coordinating care related to the above assessment and plan.  Asencion Gowda, NP  Please contact Palliative Medicine Team phone at [phone] for questions and concerns.

## 2019-05-04 NOTE — Telephone Encounter (Signed)
The patient is still currently admitted.  Will reassess tomorrow for possible TCM call.  

## 2019-05-04 NOTE — TOC Progression Note (Signed)
Transition of Care [facility]) - Progression Note    Patient Details  Name: Joshua Petersen MRN: [id number] Date of Birth: May 16, 1955  [facility] CM/SW Contact  Shelbie Hutching, RN Phone Number: 05/04/2019, 4:56 PM  Clinical Narrative:    Per palliative care NP patient's son [name] would like for the patient to discharge to [facility] and rehab in [place].  Patient's ex wife is currently at [facility] and the son would like for them to be in the same facility.   [facility] will review patient's chart and see if he is appropriate for their facility.  RNCM will follow up tomorrow.    Expected Discharge Plan: Long Term Nursing Home Barriers to Discharge: Continued Medical Work up  Expected Discharge Plan and Services Expected Discharge Plan: [place]       Living arrangements for the past 2 months: Single Family Home                                       Social Determinants of Health (SDOH) Interventions    Readmission Risk Interventions Readmission Risk Prevention Plan 04/24/2019  Medication Review (RN Care Manager) Complete  Palliative Care Screening Not Applicable  Some recent data might be hidden

## 2019-05-04 NOTE — Progress Notes (Signed)
  Speech Language Pathology Treatment: Dysphagia  Patient Details Name: Joshua Petersen MRN: [id number] DOB: 01-11-1956 Today's Date: 05/04/2019 Time: 8127-5170 SLP Time Calculation (min) (ACUTE ONLY): 24 min  Assessment / Plan / Recommendation Clinical Impression  Received request for a reassessment to see if Pt can have a diet upgrade. Pt refused to eat the pureed foods this morning. Today, Pt was more alert than noted on previous days. Pt tolerated several sips of water without any s/s of aspiration. Often needed cues to slow rate of intake. Pt was able to chew 2 bites of graham cracker but needed extended time to chew secondary to no dentures. He needed several sips of water to eventually clear the foods. Rec trial of dysphagia 2 chopped diet. Pt will need supervision at meals to ensure slow rate and ability to tolerate new texture. Nsg aware.Pt will need cues to chew and swallow foods prior to taking liquids to 'wash' foods down. Swallowing strategies posted.  HPI        SLP Plan  Continue with current plan of care   ST to follow up with toleration of diet.    Recommendations  Diet recommendations: Dysphagia 2 (fine chop);Thin liquid Medication Administration: Whole meds with puree Supervision: Staff to assist with self feeding;Full supervision/cueing for compensatory strategies Compensations: Small sips/bites;Slow rate;Minimize environmental distractions Postural Changes and/or Swallow Maneuvers: Seated upright 90 degrees;Upright 30-60 min after meal                Follow up Recommendations: Skilled Nursing facility SLP Visit Diagnosis: Dysphagia, oropharyngeal phase (R13.12) Plan: Continue with current plan of care       GO                Lucila Maine 05/04/2019, 11:35 AM

## 2019-05-04 NOTE — TOC Progression Note (Signed)
Transition of Care [facility]) - Progression Note    Patient Details  Name: Joshua Petersen MRN: [id number] Date of Birth: 26-Jul-1955  Transition of Care [facility]) CM/SW Contact  Shelbie Hutching, RN Phone Number: 05/04/2019, 1:32 PM  Clinical Narrative:    Bed search for skilled nursing rehab started.   Expected Discharge Plan: Long Term Nursing Home Barriers to Discharge: Continued Medical Work up  Expected Discharge Plan and Services Expected Discharge Plan: [place]       Living arrangements for the past 2 months: Single Family Home                                       Social Determinants of Health (SDOH) Interventions    Readmission Risk Interventions Readmission Risk Prevention Plan 04/24/2019  Medication Review (RN Care Manager) Complete  Palliative Care Screening Not Applicable  Some recent data might be hidden

## 2019-05-04 NOTE — [place] FL2 (Signed)
[place] MEDICAID FL2 LEVEL OF CARE SCREENING TOOL     IDENTIFICATION  Patient Name: Joshua Petersen Birthdate: Jun 09, 1955 Sex: male Admission Date (Current Location): 04/23/2019  [place] and [place] Number:  Chiropodist and Address:  [facility], [address], [place], [place] [address]      Provider Number: [id number]  Attending Physician Name and Address:  Enedina Finner, MD  Relative Name and Phone Number:  Kaylub Detienne [phone]    Current Level of Care: Hospital Recommended Level of Care: Skilled Nursing Facility Prior Approval Number:    Date Approved/Denied:   PASRR Number: [id number]  Discharge Plan: SNF    Current Diagnoses: Patient Active Problem List   Diagnosis Date Noted  . Generalized weakness   . Alcohol abuse 05/02/2019  . Alcoholic cardiomyopathy (HCC)   . Hypernatremia   . Opioid abuse (HCC)   . Acute metabolic encephalopathy   . Essential hypertension   . Acute systolic CHF (congestive heart failure) (HCC) 04/28/2019  . Alcohol withdrawal syndrome with complication, with unspecified complication (HCC) 04/24/2019  . Acute respiratory failure with hypoxia and hypercapnia (HCC) 04/24/2019  . AKI (acute kidney injury) (HCC) 04/24/2019  . Drug overdose 04/23/2019  . Status post total knee replacement using cement, right 12/11/2018  . Chronic pain of right knee 06/05/2018  . 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 Primary osteoarthritis of right knee 05/08/2018  . 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 Morbid obesity with BMI of 40.0-44.9, adult (HCC) 12/28/2015    Orientation RESPIRATION BLADDER Height & Weight     Self  O2(2L ) Incontinent, Indwelling catheter Weight: 126.4 kg Height:  5\' 8"  (172.7 cm)  BEHAVIORAL SYMPTOMS/MOOD NEUROLOGICAL BOWEL NUTRITION STATUS      Incontinent Diet(Dysphagia diet 2)  AMBULATORY STATUS COMMUNICATION OF NEEDS Skin   Extensive  Assist Verbally                         Personal Care Assistance Level of Assistance  Bathing, Feeding, Dressing Bathing Assistance: Maximum assistance Feeding assistance: Maximum assistance Dressing Assistance: Maximum assistance     Functional Limitations Info             SPECIAL CARE FACTORS FREQUENCY  PT (By licensed PT), OT (By licensed OT)     PT Frequency: 5 times per week OT Frequency: 5 times per week            Contractures Contractures Info: Not present    Additional Factors Info  Code Status, Allergies Code Status Info: Full Allergies Info: NKA           Current Medications (05/04/2019):  This is the current hospital active medication list Current Facility-Administered Medications  Medication Dose Route Frequency Provider Last Rate Last Admin  . albuterol (PROVENTIL) (2.5 MG/3ML) 0.083% nebulizer solution 2.5 mg  2.5 mg Inhalation Q6H PRN 05/06/2019, MD   2.5 mg at 04/26/19 0947  . atorvastatin (LIPITOR) tablet 40 mg  40 mg Oral QHS 04/28/19, MD   40 mg at 05/03/19 2049  . bisacodyl (DULCOLAX) EC tablet 5 mg  5 mg Oral Daily PRN 2050, MD   5 mg at 05/04/19 0950  . buprenorphine-naloxone (SUBOXONE) 2-0.5 mg per SL tablet 1 tablet  1 tablet Sublingual Daily 05/06/19, MD   1 tablet at 05/04/19 0853  . carvedilol (COREG) tablet 12.5 mg  12.5 mg Oral BID WC  Fritzi Mandes, MD   12.5 mg at 05/04/19 2876  . Chlorhexidine Gluconate Cloth 2 % PADS 6 each  6 each Topical Daily Jennye Boroughs, MD   6 each at 05/04/19 [phone]  . enoxaparin (LOVENOX) injection 40 mg  40 mg Subcutaneous Q24H Dallie Piles, RPH   40 mg at 05/04/19 7262  . feeding supplement (ENSURE ENLIVE) (ENSURE ENLIVE) liquid 237 mL  237 mL Oral BID BM Fritzi Mandes, MD   237 mL at 05/04/19 1243  . finasteride (PROSCAR) tablet 5 mg  5 mg Oral Daily Jennye Boroughs, MD   5 mg at 05/04/19 0853  . folic acid (FOLVITE) tablet 1 mg  1 mg Oral Daily Fritzi Mandes, MD   1 mg at 05/04/19  0853  . hydrOXYzine (ATARAX/VISTARIL) tablet 25 mg  25 mg Oral Q6H PRN Patrecia Pour, NP      . lisinopril (ZESTRIL) tablet 5 mg  5 mg Oral Daily Fritzi Mandes, MD   5 mg at 05/04/19 0853  . loperamide (IMODIUM) capsule 2-4 mg  2-4 mg Oral PRN Patrecia Pour, NP      . MEDLINE mouth rinse  15 mL Mouth Rinse BID Jennye Boroughs, MD   15 mL at 05/04/19 0854  . multivitamin with minerals tablet 1 tablet  1 tablet Oral Daily Jennye Boroughs, MD   1 tablet at 05/04/19 [phone]  . ondansetron (ZOFRAN-ODT) disintegrating tablet 4 mg  4 mg Oral Q6H PRN Patrecia Pour, NP      . pantoprazole (PROTONIX) EC tablet 40 mg  40 mg Oral Daily Jennye Boroughs, MD   40 mg at 05/04/19 0853  . polyethylene glycol (MIRALAX / GLYCOLAX) packet 17 g  17 g Oral Daily Fritzi Mandes, MD   17 g at 05/04/19 0950  . tamsulosin (FLOMAX) capsule 0.8 mg  0.8 mg Oral Daily Jennye Boroughs, MD   0.8 mg at 05/04/19 0853  . thiamine tablet 100 mg  100 mg Oral Daily Patrecia Pour, NP   100 mg at 05/04/19 9741     Discharge Medications: Please see discharge summary for a list of discharge medications.  Relevant Imaging Results:  Relevant Lab Results:   Additional Information SSN [id number]  Shelbie Hutching, RN

## 2019-05-04 NOTE — Progress Notes (Signed)
[facility]  - [place] at [facility]   PATIENT NAME: Joshua Petersen    MR#:  [id number]  DATE OF BIRTH:  1955/08/15  SUBJECTIVE:  More awake--per RN did drink ensure  And drinking water Per RN had magic cup yday, does not like texture of his. Diet. Overall weak, although able to raise his arms will the shoulder level. Trying to flex his knees. Wants his son to come and get his house keys REVIEW OF SYSTEMS:   Review of Systems  Constitutional: Negative for chills, fever and weight loss.  HENT: Negative for ear discharge, ear pain and nosebleeds.   Eyes: Negative for blurred vision, pain and discharge.  Respiratory: Negative for sputum production, shortness of breath, wheezing and stridor.   Cardiovascular: Negative for chest pain, palpitations, orthopnea and PND.  Gastrointestinal: Negative for abdominal pain, diarrhea, nausea and vomiting.  Genitourinary: Negative for frequency and urgency.  Musculoskeletal: Negative for back pain and joint pain.  Neurological: Positive for weakness. Negative for sensory change, speech change and focal weakness.  Psychiatric/Behavioral: Negative for depression and hallucinations. The patient is not nervous/anxious.      DRUG ALLERGIES:  No Known Allergies  VITALS:  Blood pressure (!) 155/80, pulse 62, temperature 97.6 F (36.4 C), temperature source Oral, resp. rate 18, height 5\' 8"  (1.727 m), weight 126.4 kg, SpO2 97 %.  PHYSICAL EXAMINATION:   Physical Examlimited  GENERAL:  63 y.o.-year-old patient lying in the bed with no acute distress. obese EYES: Pupils equal, round, reactive to light and accommodation. No scleral icterus.  HEENT: Head atraumatic, normocephalic.  Oral mucosa dry NECK:  Supple, no jugular venous distention. No thyroid enlargement, no tenderness.  LUNGS: Normal breath sounds bilaterally, no wheezing, rales, rhonchi. No use of accessory muscles of respiration.  CARDIOVASCULAR: S1, S2 normal. No murmurs,  rubs, or gallops.  ABDOMEN: Soft, nontender, nondistended. Bowel sounds present. No organomegaly or mass.  EXTREMITIES: No cyanosis, clubbing or edema b/l.    NEUROLOGIC: moves spontaneously extremities. Unable to participate in full exam Globally deconditioned PSYCHIATRIC: awake some baseline confusion  - response to verbal command and answers basic questions.  SKIN: exam per RN  LABORATORY PANEL:  CBC Recent Labs  Lab 04/28/19 0502  WBC 14.3*  HGB 14.1  HCT 41.5  PLT 197    Chemistries  Recent Labs  Lab 04/28/19 0502 05/02/19 0538 05/03/19 0832  NA 146* 150* 144  K 4.1 3.5  --   CL 107 105  --   CO2 26 34*  --   GLUCOSE 111* 132*  --   BUN 45* 42*  --   CREATININE 1.42* 1.10  --   CALCIUM 8.9 8.6*  --   MG 1.7  --   --    Cardiac Enzymes No results for input(s): TROPONINI in the last 168 hours. RADIOLOGY:  No results found. ASSESSMENT AND PLAN:  Joshua Petersen is a 63 y.o. male with medical history significant of HTN, chronic low back pain, called EMS and was brought to the hospital for generalized body aches.  He was in the waiting room for a few hours and on intake he was found to be slumped over barely responsive.  *Acute toxic metabolic encephalopathy ?etio unclear  - Continue supportive care-slowly improving -more awake and having some meaningful conversation. Wants to eat vanilla ice cream -Not on any sedating meds - neurology Dr. 77 -- appreciate input -MRI brain negative -EEG no epileptiform activity -continue PO thiamine, multivitamin and Folate  *  Acute kidney injury: Improving but creatinine is back to baseline.   -Creatinine was 0.81 on 04/17/2019.   -No hydronephrosis or obstruction noted on renal ultrasound.     *Acute systolic heart failure fluid overload/acute pulmonary edema/sinus tachycardia -2D echo on 04/27/2019 showed EF of 25 to 30%.   -Consulted [facility] cardiologist.  -low probability of PE on V/Q scan  -received IV lasix with good  UOP 15 liters--d/c lasix since pt's is not on any po lasix at present since he was not taking anything po and sodium up-- although now improved oral intake slowly. -Lasix as needed -wean oxygen as tolerated -on coreg and lisinopril  *BPH with acute urinary retention - Foley catheter in place.   -Continue finasteride and Flomax   *Hypernatremia secondary to poor PO intake  -discussed with dietitian.  -sodium 143--150--155--146--150--144--d/c IV D5W -pt eating some-- speech therapy to see today  *Alcohol abuse with alcohol withdrawal syndrome? Depression ? Failure to thrive ?organic brain syndrome -palliative care to see patient-- see their notes for discussion with son --trial of high dose thiamine  -pt verbalizing some, more awake and alert  *Hypertension:  -po coreg and lisinopril  *Debility/ ? Critical illness myopathy - PT and OT evaluation  -recommends rehab  *TOC consulted waiting to be seen--for d/c planning to SNF  Spoke with son on phone on Sunday Procedures:none Family communication : son Joshua Petersen consults : cardiology, psychiatry, neurology  Discharge Disposition :TBD CODE STATUS: FULL DVT Prophylaxis :lovenox  TOTAL TIME TAKING CARE OF THIS PATIENT: *25 minutes.  >50% time spent on counselling and coordination of care  POSSIBLE D/C IN **?* DAYS, DEPENDING ON CLINICAL CONDITION.  Note: This dictation was prepared with Dragon dictation along with smaller phrase technology. Any transcriptional errors that result from this process are unintentional.  Joshua Petersen M.D on 05/04/2019 at 11:15 AM  Between 7am to 6pm - Pager - [phone]  After 6pm go to [email or web address]  [facility]   CC: Primary care physician; Joshua Petersen, MDPatient ID: Joshua Petersen, male   DOB: January 14, 1956, 63 y.o.   MRN: [id number]

## 2019-05-04 NOTE — Telephone Encounter (Signed)
TCM....  Patient is being discharged      They are scheduled to see Urban Gibson on 12/29   They were seen for overdose tach htn HF   They need to be seen within 7-10 days      Please call

## 2019-05-05 ENCOUNTER — Encounter: Payer: Self-pay | Admitting: Internal Medicine

## 2019-05-05 LAB — GLUCOSE, CAPILLARY: Glucose-Capillary: 117 mg/dL — ABNORMAL HIGH (ref 70–99)

## 2019-05-05 MED ORDER — ONDANSETRON 4 MG PO TBDP
4.0000 mg | ORAL_TABLET | Freq: Four times a day (QID) | ORAL | Status: DC | PRN
  Filled 2019-05-05: qty 1

## 2019-05-05 MED ORDER — ACETAMINOPHEN 325 MG PO TABS: 650.0000 mg | ORAL_TABLET | Freq: Four times a day (QID) | ORAL | Status: DC | PRN

## 2019-05-05 MED ORDER — ONDANSETRON HCL 4 MG/2ML IJ SOLN: 4.0000 mg | Freq: Four times a day (QID) | INTRAMUSCULAR | Status: DC | PRN

## 2019-05-05 MED ORDER — FUROSEMIDE 20 MG PO TABS
20.0000 mg | ORAL_TABLET | Freq: Every day | ORAL | Status: DC
  Administered 2019-05-05 – 2019-05-08 (×4): 20 mg via ORAL
  Filled 2019-05-05 (×3): qty 1

## 2019-05-05 MED ORDER — MORPHINE SULFATE (PF) 2 MG/ML IV SOLN: 2.0000 mg | INTRAVENOUS | Status: DC | PRN

## 2019-05-05 MED ORDER — SODIUM CHLORIDE 0.9% FLUSH: 3.0000 mL | INTRAVENOUS | Status: DC | PRN

## 2019-05-05 MED ORDER — INSULIN ASPART 100 UNIT/ML ~~LOC~~ SOLN: 0.0000 [IU] | Freq: Every day | SUBCUTANEOUS | Status: DC

## 2019-05-05 MED ORDER — INSULIN ASPART 100 UNIT/ML ~~LOC~~ SOLN: 0.0000 [IU] | Freq: Three times a day (TID) | SUBCUTANEOUS | Status: DC

## 2019-05-05 MED ORDER — BUPRENORPHINE HCL-NALOXONE HCL 2-0.5 MG SL SUBL
1.0000 | SUBLINGUAL_TABLET | Freq: Every day | SUBLINGUAL | Status: DC
  Administered 2019-05-06 – 2019-05-07 (×2): 1 via SUBLINGUAL
  Filled 2019-05-05 (×2): qty 1

## 2019-05-05 MED ORDER — HALOPERIDOL 0.5 MG PO TABS
0.5000 mg | ORAL_TABLET | ORAL | Status: DC | PRN
  Filled 2019-05-05: qty 1

## 2019-05-05 MED ORDER — LORAZEPAM 1 MG PO TABS
1.0000 mg | ORAL_TABLET | ORAL | Status: DC | PRN
  Administered 2019-05-05 – 2019-05-08 (×5): 1 mg via ORAL
  Filled 2019-05-05 (×5): qty 1

## 2019-05-05 MED ORDER — LORAZEPAM 2 MG/ML PO CONC
1.0000 mg | ORAL | Status: DC | PRN
  Filled 2019-05-05: qty 0.5

## 2019-05-05 MED ORDER — POLYVINYL ALCOHOL 1.4 % OP SOLN
1.0000 [drp] | Freq: Four times a day (QID) | OPHTHALMIC | Status: DC | PRN
  Filled 2019-05-05: qty 15

## 2019-05-05 MED ORDER — HALOPERIDOL LACTATE 5 MG/ML IJ SOLN: 0.5000 mg | INTRAMUSCULAR | Status: DC | PRN

## 2019-05-05 MED ORDER — ACETAMINOPHEN 650 MG RE SUPP: 650.0000 mg | Freq: Four times a day (QID) | RECTAL | Status: DC | PRN

## 2019-05-05 MED ORDER — GLYCOPYRROLATE 0.2 MG/ML IJ SOLN
0.2000 mg | INTRAMUSCULAR | Status: DC | PRN
  Filled 2019-05-05: qty 1

## 2019-05-05 MED ORDER — BIOTENE DRY MOUTH MT LIQD: 15.0000 mL | OROMUCOSAL | Status: DC | PRN

## 2019-05-05 MED ORDER — SODIUM CHLORIDE 0.9% FLUSH
3.0000 mL | Freq: Two times a day (BID) | INTRAVENOUS | Status: DC
  Administered 2019-05-05 – 2019-05-08 (×4): 3 mL via INTRAVENOUS

## 2019-05-05 MED ORDER — GLYCOPYRROLATE 1 MG PO TABS
1.0000 mg | ORAL_TABLET | ORAL | Status: DC | PRN
  Filled 2019-05-05: qty 1

## 2019-05-05 MED ORDER — HALOPERIDOL LACTATE 2 MG/ML PO CONC
0.5000 mg | ORAL | Status: DC | PRN
  Filled 2019-05-05: qty 0.3

## 2019-05-05 MED ORDER — FUROSEMIDE 20 MG PO TABS
20.0000 mg | ORAL_TABLET | ORAL | Status: DC
  Filled 2019-05-05: qty 1

## 2019-05-05 MED ORDER — LORAZEPAM 2 MG/ML IJ SOLN
1.0000 mg | INTRAMUSCULAR | Status: DC | PRN
  Administered 2019-05-07: 21:00:00 1 mg via INTRAVENOUS
  Filled 2019-05-05: qty 1

## 2019-05-05 NOTE — Telephone Encounter (Addendum)
Pt still admitted as of 05/05/19 8:51am .. will review for TCM  again tomorrow.

## 2019-05-05 NOTE — Progress Notes (Signed)
I spoke with patient's son, Joshua Petersen by phone with Asencion Gowda, NP, who has seen and examined patient. Son verbalized to me clearly a desire to stop aggressive treatments and transition patient to full comfort care. We discussed comfort care including stopping all non-symptom related medications, IVFs, labs, abx, etc. Son again confirmed desire for comfort care. He agreed with DNR/DNI. Son is interested in residential hospice.   Time Total: 10 minutes  Visit consisted of counseling and education dealing with the complex and emotionally intense issues of symptom management and palliative care in the setting of serious and potentially life-threatening illness.Greater than 50%  of this time was spent counseling and coordinating care related to the above assessment and plan.  Signed by: Altha Harm, PhD, NP-C

## 2019-05-05 NOTE — Progress Notes (Signed)
[facility]  - [place] [facility]   PATIENT NAME: Joshua Petersen    MR#:  [id number]  DATE OF BIRTH:  03-Dec-1955  SUBJECTIVE:  patient awake alert how were unable to carry out meaningful conversation. Still continues to have poor PO intake. Intermittently drinks ensure and water.  REVIEW OF SYSTEMS:   Review of Systems  Unable to perform ROS: Mental status change    DRUG ALLERGIES:  No Known Allergies  VITALS:  Blood pressure 138/65, pulse (!) 57, temperature 97.9 F (36.6 C), resp. rate 18, height 5\' 8"  (1.727 m), weight 126.4 kg, SpO2 93 %.  PHYSICAL EXAMINATION:   Physical Examlimited  GENERAL:  63 y.o.-year-old patient lying in the bed with no acute distress. obese EYES: Pupils equal, round, reactive to light and accommodation. No scleral icterus.  HEENT: Head atraumatic, normocephalic.  Oral mucosa dry NECK:  Supple, no jugular venous distention. No thyroid enlargement, no tenderness.  LUNGS: Normal breath sounds bilaterally, no wheezing, rales, rhonchi. No use of accessory muscles of respiration.  CARDIOVASCULAR: S1, S2 normal. No murmurs, rubs, or gallops.  ABDOMEN: Soft, nontender, nondistended. Bowel sounds present. No organomegaly or mass.  EXTREMITIES: No cyanosis, clubbing or edema b/l.    NEUROLOGIC: moves spontaneously extremities. Unable to participate in full exam Globally deconditioned PSYCHIATRIC: awake some baseline confusion  - response to verbal command and answers basic questions.  SKIN: exam per RN  LABORATORY PANEL:  CBC No results for input(s): WBC, HGB, HCT, PLT in the last 168 hours.  Chemistries  Recent Labs  Lab 05/02/19 0538 05/03/19 0832  NA 150* 144  K 3.5  --   CL 105  --   CO2 34*  --   GLUCOSE 132*  --   BUN 42*  --   CREATININE 1.10  --   CALCIUM 8.6*  --    Cardiac Enzymes No results for input(s): TROPONINI in the last 168 hours. RADIOLOGY:  No results found. ASSESSMENT AND PLAN:  Joshua Petersen is a 63  y.o. male with medical history significant of HTN, chronic low back pain, called EMS and was brought to the hospital for generalized body aches.  He was in the waiting room for a few hours and on intake he was found to be slumped over barely responsive.  *Acute toxic metabolic encephalopathy ?etio unclear  - Continue supportive care-slowly improving -more awake and having some meaningful conversation. Wants to eat vanilla ice cream -Not on any sedating meds - neurology Dr. 77 -- appreciate input -MRI brain negative -EEG no epileptiform activity -continue PO thiamine, multivitamin and Folate  *Acute kidney injury: Improving but creatinine is back to baseline.   -Creatinine was 0.81 on 04/17/2019.   -No hydronephrosis or obstruction noted on renal ultrasound.     *Acute systolic heart failure fluid overload/acute pulmonary edema/sinus tachycardia -2D echo on 04/27/2019 showed EF of 25 to 30%.   -Consulted [facility] cardiologist.  -low probability of PE on V/Q scan  -received IV lasix with good UOP 15 liters--d/c lasix since pt's is not on any po lasix at present since he was not taking anything po and sodium up-- although now improved oral intake slowly. -Lasix 20 mg daily -wean oxygen as tolerated -on coreg and lisinopril  *BPH with acute urinary retention - Foley catheter in place.   -Continue finasteride and Flomax   *Hypernatremia secondary to poor PO intake  -discussed with dietitian.  -sodium 143--150--155--146--150--144--d/c IV D5W -pt eating some-- speech therapy to see  today  *Alcohol abuse with alcohol withdrawal syndrome? Depression ? Failure to thrive ?organic brain syndrome -palliative care to see patient-- see their notes for discussion with son --trial of high dose thiamine  -pt verbalizing some, more awake and alert -palliative care input appreciated. I spoke with patient's son Joshua Petersen who has requested patient be comfort care and requesting hospice home  placement. -Hospice referral made. -Care management for discharge planning to hospice home. -Plan is to continue his cardiac meds for now.  Procedures:none Family communication : son Joshua Petersen consults : cardiology, psychiatry, neurology  Discharge Disposition : hospice facility  CODE STATUS: DNR DVT Prophylaxis :lovenox  TOTAL TIME TAKING CARE OF THIS PATIENT: *25 minutes.  >50% time spent on counselling and coordination of care   Note: This dictation was prepared with Dragon dictation along with smaller phrase technology. Any transcriptional errors that result from this process are unintentional.  Fritzi Mandes M.D on 05/05/2019 at 4:25 PM  Between 7am to 6pm - Pager - [phone]  After 6pm go to [email or web address]  [facility]   CC: Primary care physician; Joshua Petersen, MDPatient ID: Joshua Petersen, male   DOB: 1955-08-13, 63 y.o.   MRN: [id number]

## 2019-05-05 NOTE — Progress Notes (Signed)
New referral for [facility] hospice home received from [facility]. Discussed Suboxone with Dr. Posey Pronto and Palliative NP Asencion Gowda. Patient information faxed to referral. Pending approval from hospice medical director. CMRN Doran Clay updated. Flo Shanks BSN, RN, [place] [phone]

## 2019-05-05 NOTE — Progress Notes (Signed)
Daily Progress Note   Patient Name: Joshua Petersen       Date: 05/05/2019 DOB: 30-Jun-1955  Age: 63 y.o. MRN#: [id number] Attending Physician: Fritzi Mandes, MD Primary Care Physician: [name], MD Admit Date: 04/23/2019  Reason for Consultation/Follow-up: Establishing goals of care  Subjective: Patient is resting in bed. PT/OT is working with him and he states he feels "like a dish rag". Attempted to discuss [place], if he wants to focus on comfort or continue life prolonging care. He states he wants to "leave things alone". He will not elaborate on if this means to leave the care as it is or leave him alone and let him be comfortable. He appears confused.   Spoke with staff, his appetite is minimal. He will not eat a pureed diet, and spits out a chopped diet. Spoke with son, he would like to focus on full comfort care, with only medications for comfort. Attempted to complete a MOST form through [facility] for (DNR, comfort measures, no IV fluids, no feeding tube, and no antibiotics), but his phone had technical issues and the form could not be completed. Josh Borders NP witnessed conversation for transition to comfort care. Request mad for hospice facility.   Length of Stay: 12  Current Medications: Scheduled Meds:  . atorvastatin  40 mg Oral QHS  . buprenorphine-naloxone  1 tablet Sublingual Daily  . carvedilol  12.5 mg Oral BID WC  . Chlorhexidine Gluconate Cloth  6 each Topical Daily  . enoxaparin (LOVENOX) injection  40 mg Subcutaneous Q24H  . feeding supplement (ENSURE ENLIVE)  237 mL Oral TID BM  . finasteride  5 mg Oral Daily  . folic acid  1 mg Oral Daily  . furosemide  20 mg Oral Daily  . insulin aspart  0-5 Units Subcutaneous QHS  . insulin aspart  0-9 Units Subcutaneous TID  WC  . lisinopril  5 mg Oral Daily  . mouth rinse  15 mL Mouth Rinse BID  . multivitamin with minerals  1 tablet Oral Daily  . pantoprazole  40 mg Oral Daily  . polyethylene glycol  17 g Oral Daily  . tamsulosin  0.8 mg Oral Daily  . thiamine  100 mg Oral Daily    Continuous Infusions:   PRN Meds: albuterol, bisacodyl, hydrOXYzine, loperamide, ondansetron  Physical  Exam Pulmonary:     Effort: Pulmonary effort is normal.  Neurological:     Mental Status: He is alert.     Comments: Somewhat confused but improving.              Vital Signs: BP 138/65 (BP Location: Right Arm)   Pulse (!) 57   Temp 97.9 F (36.6 C)   Resp 18   Ht 5\' 8"  (1.727 m)   Wt 126.4 kg   SpO2 93%   BMI 42.37 kg/m  SpO2: SpO2: 93 % O2 Device: O2 Device: Nasal Cannula O2 Flow Rate: O2 Flow Rate (L/min): 2 L/min  Intake/output summary:   Intake/Output Summary (Last 24 hours) at 05/05/2019 1107 Last data filed at 05/05/2019 05/07/2019 Gross per 24 hour  Intake 360 ml  Output 1000 ml  Net -640 ml   LBM: Last BM Date: 04/28/19 Baseline Weight: Weight: 122.5 kg Most recent weight: Weight: 126.4 kg       Palliative Assessment/Data:30%      Patient Active Problem List   Diagnosis Date Noted  . Generalized weakness   . Alcohol abuse 05/02/2019  . Alcoholic cardiomyopathy (HCC)   . Hypernatremia   . Opioid abuse (HCC)   . Acute metabolic encephalopathy   . Essential hypertension   . Acute systolic CHF (congestive heart failure) (HCC) 04/28/2019  . Alcohol withdrawal syndrome with complication, with unspecified complication (HCC) 04/24/2019  . Acute respiratory failure with hypoxia and hypercapnia (HCC) 04/24/2019  . AKI (acute kidney injury) (HCC) 04/24/2019  . Drug overdose 04/23/2019  . Status post total knee replacement using cement, right 12/11/2018  . Chronic pain of right knee 06/05/2018  . 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 Primary osteoarthritis of  right knee 05/08/2018  . 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 Morbid obesity with BMI of 40.0-44.9, adult (HCC) 12/28/2015    Palliative Care Assessment & Plan    Recommendations/Plan: Shifting to comfort care. Requesting hospice facility placement.   Code Status:    Code Status Orders  (From admission, onward)         Start     Ordered   04/23/19 0701  Full code  Continuous     04/23/19 0702        Code Status History    Date Active Date Inactive Code Status Order ID Comments User Context   12/11/2018 1131 12/12/2018 1530 Full Code 12/14/2018  [id number], MD Inpatient   03/12/2017 1246 03/15/2017 1756 Full Code 13/06/2016  [id number], MD Inpatient   Advance Care Planning Activity       Prognosis:   < 2 weeks  Depending on oral intake.     Care plan was discussed with RN  Thank you for allowing the Palliative Medicine Team to assist in the care of this patient.   Time In: 11:20 Time Out: 11:30 Total Time 70 min Prolonged Time Billed yes      Greater than 50%  of this time was spent counseling and coordinating care related to the above assessment and plan.  Excell Seltzer, NP  Please contact Palliative Medicine Team phone at [phone] for questions and concerns.

## 2019-05-05 NOTE — TOC Progression Note (Signed)
Transition of Care [facility]) - Progression Note    Patient Details  Name: Joshua Petersen MRN: [id number] Date of Birth: 1955-08-14  [facility] CM/SW Contact  Shelbie Hutching, RN Phone Number: 05/05/2019, 10:31 AM  Clinical Narrative:    [facility] is unable to accept patient to their facility.  RNCM spoke with patient's son [name] and he agrees to placement in a facility in [place].  No bed offers received at this time, RNCM will make some calls.    Expected Discharge Plan: Long Term Nursing Home Barriers to Discharge: Continued Medical Work up  Expected Discharge Plan and Services Expected Discharge Plan: [place]       Living arrangements for the past 2 months: Single Family Home                                       Social Determinants of Health (SDOH) Interventions    Readmission Risk Interventions Readmission Risk Prevention Plan 04/24/2019  Medication Review (RN Care Manager) Complete  Palliative Care Screening Not Applicable  Some recent data might be hidden

## 2019-05-05 NOTE — Progress Notes (Signed)
Physical Therapy Treatment Patient Details Name: Joshua Petersen MRN: [id number] DOB: Jan 11, 1956 Today's Date: 05/05/2019    History of Present Illness Pt. is a 63 y.o. male with medical history significant of HTN, chronic low back pain, called EMS and was brought to the hospital for generalized body aches.  He was in the waiting room for a few hours and on intake he was found to be slumped over barely responsive. Admitted to ICU initially with acute toxic metabolic encephalopathy, acute kidney injury, acute systolic heart failure fluid overload/acute pulm ademea/sinus tachycardia. Patient also with history of ETOH abuse and narcotic use. S/p R TKA in 11/2018.    PT Comments    Pt received in supine. Pt with no complaints of pain. Pt presented with decreased cognition not knowing he was in the hospital and asking to be taken to the back room several times. Pt performed LE therex in supine requiring max multimodal cuing for initiation, technique and redirecting. Pt required physical assist for SLR and hip ABD. Pt following one steps commands ~50% of the time. Pt requesting help to get up to restroom. Pt educated on catheter in place. Pt required max A x2 with assist from nursing assistant for rolling and placement of bed pan. Pt requires max A to roll either direction and max vc/tc for initiation and technique for rolling. Pt able to assist with scooting up in bed using overhead trapeze and mini bridge. Pt required max cuing for active participation with scooting and repositioning in bed as well. Pt received with nasal cannula below pts nose with pt frequently removing it and placing over mouth. SpO2 assessed and 96% on RA due to nasal cannula not being correctly placed. Further transfers and OOB mobility deferred secondary to assist level required for pt and pt not following commands. Pt presents with decreased cognition, strength, ROM, power, balance and activity tolerance limiting functional mobility. Pt  would benefit from cont acute PT to improve deficits. Recommendation for SNF to further improve deficits, decrease caregiver burden and fall risk.    Follow Up Recommendations  SNF     Equipment Recommendations  Rolling walker with 5" wheels    Recommendations for Other Services       Precautions / Restrictions Precautions Precautions: Fall Restrictions Weight Bearing Restrictions: No    Mobility  Bed Mobility Overal bed mobility: Needs Assistance Bed Mobility: Rolling Rolling: Max assist;+2 for safety/equipment   Supine to sit: Max assist Sit to supine: Max assist   General bed mobility comments: max A for rolling each direction to allow for placement and removal of bed pan, pt required max vc and tc for initiation, sequencing and technique, max A for physical assist as well, nursing assistant present to assist with rolling and bed pan mgt  Transfers                 General transfer comment: deferred for pt/therapist safety and lack of following commands from pt  Ambulation/Gait                 Stairs             Wheelchair Mobility    Modified Rankin (Stroke Patients Only)       Balance                                            Cognition  Arousal/Alertness: Awake/alert Behavior During Therapy: Flat affect Overall Cognitive Status: Impaired/Different from baseline Area of Impairment: Orientation;Attention;Following commands;Safety/judgement;Awareness;Problem solving                 Orientation Level: Disoriented to;Place;Situation Current Attention Level: Focused   Following Commands: Follows one step commands inconsistently;Follows one step commands with increased time Safety/Judgement: Decreased awareness of safety;Decreased awareness of deficits Awareness: Intellectual Problem Solving: Slow processing;Decreased initiation;Difficulty sequencing;Requires verbal cues;Requires tactile cues General Comments: pt  follows one step commands ~50-60% of the time, pt asking to be taken to back room during session      Exercises Total Joint Exercises Ankle Circles/Pumps: AROM;Both;10 reps Short Arc Quad: AROM;Both;10 reps Heel Slides: AROM;Both;10 reps Hip ABduction/ADduction: AAROM;Both;10 reps Straight Leg Raises: AAROM;Both;10 reps    General Comments        Pertinent Vitals/Pain Pain Assessment: No/denies pain    Home Living                      Prior Function            PT Goals (current goals can now be found in the care plan section) Progress towards PT goals: Progressing toward goals    Frequency    Min 3X/week      PT Plan Current plan remains appropriate    Co-evaluation              AM-PAC PT "6 Clicks" Mobility   Outcome Measure  Help needed turning from your back to your side while in a flat bed without using bedrails?: Total Help needed moving from lying on your back to sitting on the side of a flat bed without using bedrails?: Total Help needed moving to and from a bed to a chair (including a wheelchair)?: Total Help needed standing up from a chair using your arms (e.g., wheelchair or bedside chair)?: Total Help needed to walk in hospital room?: Total Help needed climbing 3-5 steps with a railing? : Total 6 Click Score: 6    End of Session Equipment Utilized During Treatment: Other (comment);Oxygen(oxygen present but nasal cannula not in pts nose, pt frequently putting it on his mouth)   Patient left: in bed;with call bell/phone within reach;with bed alarm set Nurse Communication: Mobility status PT Visit Diagnosis: Muscle weakness (generalized) (M62.81);Difficulty in walking, not elsewhere classified (R26.2)     Time: 1030-1101 PT Time Calculation (min) (ACUTE ONLY): 31 min  Charges:  $Therapeutic Exercise: 8-22 mins $Therapeutic Activity: 8-22 mins                     Zachary George PT, DPT 11:41 AM,05/05/19 [phone]    [name] [name] 05/05/2019, 11:35 AM

## 2019-05-05 NOTE — Progress Notes (Signed)
Occupational Therapy Treatment Patient Details Name: Joshua Petersen MRN: [id number] DOB: [birth year] Today's Date: 05/05/2019    History of present illness Pt. is a 63 y.o. male with medical history significant of HTN, chronic low back pain, called EMS and was brought to the hospital for generalized body aches.  He was in the waiting room for a few hours and on intake he was found to be slumped over barely responsive. Admitted to ICU initially with acute toxic metabolic encephalopathy, acute kidney injury, acute systolic heart failure fluid overload/acute pulm ademea/sinus tachycardia. Patient also with history of ETOH abuse and narcotic use. S/p R TKA in 11/2018.   OT comments  Pt. presents with limited task initiation, and poor follow through during the task. Pt. required intermittent hand over hand assist, and cuing to perform light grooming tasks. Pt. education was provided about reacher use for general retrieval of items. Attempted light UE there. Ex. Pt. Was able to perform reps of reaching at various planes with BUEs. Pt. Was able to initiate UE there. Ex with yellow theraband and cuing, however was unable to follow through to continue the task. Pt. Continues to benefit from OT services for ADL training, A/E training, UE there. Ex. and pt./caregiver Education about cognitive compensatory strategies, home modification, and DME. Pt. Continues to be most appropriate for SNF level of care upon discharge with follow-up OT services.   Follow Up Recommendations  SNF    Equipment Recommendations  3 in 1 bedside commode    Recommendations for Other Services      Precautions / Restrictions Precautions Precautions: Fall Restrictions Weight Bearing Restrictions: No       Mobility Bed Mobility Overal bed mobility: Needs Assistance Bed Mobility: Supine to Sit;Sit to Supine     Supine to sit: Max assist Sit to supine: Max assist      Transfers                 General transfer  comment: Deferred    Balance                                           ADL either performed or assessed with clinical judgement   ADL Overall ADL's : Needs assistance/impaired Eating/Feeding: Moderate assistance;Bed level;Cueing for sequencing   Grooming: Moderate assistance;Bed level;Cueing for sequencing   Upper Body Bathing: Bed level;Maximal assistance   Lower Body Bathing: Bed level;Total assistance   Upper Body Dressing : Bed level;Maximal assistance   Lower Body Dressing: Bed level;Total assistance                       Vision       Perception     Praxis      Cognition Arousal/Alertness: Awake/alert Behavior During Therapy: Flat affect Overall Cognitive Status: Impaired/Different from baseline                                 General Comments: Pateint follows commands 50% of the time        Exercises     Shoulder Instructions       General Comments      Pertinent Vitals/ Pain       Pain Assessment: No/denies pain  Home Living  Prior Functioning/Environment              Frequency  Min 1X/week        Progress Toward Goals  OT Goals(current goals can now be found in the care plan section)  Progress towards OT goals: Progressing toward goals  Acute Rehab OT Goals OT Goal Formulation: Patient unable to participate in goal setting  Plan      Co-evaluation                 AM-PAC OT "6 Clicks" Daily Activity     Outcome Measure   Help from another person eating meals?: A Lot Help from another person taking care of personal grooming?: A Lot Help from another person toileting, which includes using toliet, bedpan, or urinal?: Total Help from another person bathing (including washing, rinsing, drying)?: Total Help from another person to put on and taking off regular upper body clothing?: A Lot Help from another person to put on and  taking off regular lower body clothing?: Total 6 Click Score: 9    End of Session    OT Visit Diagnosis: Other abnormalities of gait and mobility (R26.89);Muscle weakness (generalized) (M62.81);Other symptoms and signs involving cognitive function   Activity Tolerance Patient limited by lethargy   Patient Left in bed;with call bell/phone within reach;with bed alarm set   Nurse Communication          Time: [phone] OT Time Calculation (min): 19 min  Charges: OT General Charges $OT Visit: 1 Visit OT Treatments $Self Care/Home Management : 8-22 mins  Olegario Messier, MS, OTR/L  Olegario Messier 05/05/2019, 9:57 AM

## 2019-05-05 NOTE — TOC Progression Note (Signed)
Transition of Care [facility]) - Progression Note    Patient Details  Name: Joshua Petersen MRN: [id number] Date of Birth: 07/27/1955  Transition of Care [facility]) CM/SW Contact  Shelbie Hutching, RN Phone Number: 05/05/2019, 12:20 PM  Clinical Narrative:    Palliative team updated RNCM that patient's son would like to make patient comfort care and have him go to residential hospice. Residential hospice referral given to Flo Shanks with [facility].    Expected Discharge Plan: Long Term Nursing Home Barriers to Discharge: Continued Medical Work up  Expected Discharge Plan and Services Expected Discharge Plan: [place]       Living arrangements for the past 2 months: Single Family Home                                       Social Determinants of Health (SDOH) Interventions    Readmission Risk Interventions Readmission Risk Prevention Plan 04/24/2019  Medication Review (RN Care Manager) Complete  Palliative Care Screening Not Applicable  Some recent data might be hidden

## 2019-05-06 ENCOUNTER — Encounter: Payer: Self-pay | Admitting: Internal Medicine

## 2019-05-06 NOTE — Progress Notes (Signed)
Per discussion with Publishing copy is unable to offer a bed at this time. Hospital care tam notified. Flo Shanks BSN, RN, [facility] :[employer] [phone]

## 2019-05-06 NOTE — Progress Notes (Addendum)
Progress Note    Joshua Petersen  OIZ:[id number] DOB: 07-30-1955  DOA: 04/23/2019 PCP: Dione Housekeeper, MD        Assessment/Plan:   Principal Problem:   Drug overdose Active Problems:   Alcohol withdrawal syndrome with complication, with unspecified complication (HCC)   Acute respiratory failure with hypoxia and hypercapnia (HCC)   AKI (acute kidney injury) (HCC)   Acute systolic CHF (congestive heart failure) (HCC)   Essential hypertension   Opioid abuse (HCC)   Acute metabolic encephalopathy   Alcoholic cardiomyopathy (HCC)   Hypernatremia   Alcohol abuse   Generalized weakness   Body mass index is 42.37 kg/m.  (Morbid obesity)   Acute systolic heart failure with fluid overload/acute pulmonary edema: 2D echo on 04/27/2019 showed EF of 25 to 30%.  VQ scan showed low probability for PE.  Continue Lasix and lisinopril.  Hypertension: Lisinopril  Acute hypoxemic respiratory failure: Oxygen for comfort.  Taper off as able.  BPH with chronic urinary retention: Continue Foley catheter and Flomax.  Alcohol abuse with alcohol withdrawal syndrome: He required Precedex drip for sedation earlier on admission.  Alcohol withdrawal syndrome has resolved.  Recent drug overdose (on admission) with acute toxic metabolic encephalopathy: Patient is still confused.  Alcohol abuse may be playing a role in persistent confusion.  No evidence of stroke on MRI.  AKI and hypernatremia: Resolved  Patient has been evaluated by palliative care team and hospice team.  Plan to discharge to hospice once a bed becomes available.   Family Communication/Anticipated D/C date and plan/Code Status   DVT prophylaxis: Comfort measures Code Status: DNR on comfort measures Family Communication: None Disposition Plan: Plan to discharge to hospice house in 1 to 2 days depending on availability      Subjective:   He is confused and unable to provide any history.  He said he is at the  "[facility]"  Objective:    Vitals:   05/05/19 0738 05/06/19 0503 05/06/19 0727 05/06/19 0910  BP: 138/65 (!) 144/81 (!) 147/79 (!) 144/92  Pulse: (!) 57 74 66 77  Resp: 18 18 17    Temp: 97.9 F (36.6 C) 99.2 F (37.3 C) 97.8 F (36.6 C)   TempSrc:  Oral    SpO2: 93% 96% 95%   Weight:      Height:        Intake/Output Summary (Last 24 hours) at 05/06/2019 1558 Last data filed at 05/06/2019 1300 Gross per 24 hour  Intake 0 ml  Output 975 ml  Net -975 ml   Filed Weights   04/22/19 2216 04/23/19 0922  Weight: 122.5 kg 126.4 kg    Exam:  GEN: NAD SKIN: No rash EYES: EOMI ENT: MMM CV: RRR PULM: CTA B ABD: soft, ND, NT, +BS CNS: AAO x 1 (person ), moves extremities spontaneously EXT: No edema or tenderness   Data Reviewed:   I have personally reviewed following labs and imaging studies:  Labs: Labs show the following:   Basic Metabolic Panel: Recent Labs  Lab 04/30/19 0457 04/30/19 1356 05/02/19 0538 05/03/19 0832  NA 153* 155* 150* 144  K 3.6  --  3.5  --   CL 112*  --  105  --   CO2 31  --  34*  --   GLUCOSE 116*  --  132*  --   BUN 54*  --  42*  --   CREATININE 1.35*  --  1.10  --   CALCIUM  8.9  --  8.6*  --   PHOS  --  4.6  --   --    GFR Estimated Creatinine Clearance: 87.9 mL/min (by C-G formula based on SCr of 1.1 mg/dL). Liver Function Tests: No results for input(s): AST, ALT, ALKPHOS, BILITOT, PROT, ALBUMIN in the last 168 hours. No results for input(s): LIPASE, AMYLASE in the last 168 hours. Recent Labs  Lab 04/30/19 1356  AMMONIA 38*   Coagulation profile No results for input(s): INR, PROTIME in the last 168 hours.  CBC: No results for input(s): WBC, NEUTROABS, HGB, HCT, MCV, PLT in the last 168 hours. Cardiac Enzymes: No results for input(s): CKTOTAL, CKMB, CKMBINDEX, TROPONINI in the last 168 hours. BNP (last 3 results) No results for input(s): PROBNP in the last 8760 hours. CBG: Recent Labs  Lab 05/03/19 1136  05/05/19 1127  GLUCAP 140* 117*   D-Dimer: No results for input(s): DDIMER in the last 72 hours. Hgb A1c: No results for input(s): HGBA1C in the last 72 hours. Lipid Profile: No results for input(s): CHOL, HDL, LDLCALC, TRIG, CHOLHDL, LDLDIRECT in the last 72 hours. Thyroid function studies: No results for input(s): TSH, T4TOTAL, T3FREE, THYROIDAB in the last 72 hours.  Invalid input(s): FREET3 Anemia work up: No results for input(s): VITAMINB12, FOLATE, FERRITIN, TIBC, IRON, RETICCTPCT in the last 72 hours. Sepsis Labs: No results for input(s): PROCALCITON, WBC, LATICACIDVEN in the last 168 hours.  Microbiology No results found for this or any previous visit (from the past 240 hour(s)).  Procedures and diagnostic studies:  No results found.  Medications:   . buprenorphine-naloxone  1 tablet Sublingual Daily  . carvedilol  12.5 mg Oral BID WC  . feeding supplement (ENSURE ENLIVE)  237 mL Oral TID BM  . finasteride  5 mg Oral Daily  . furosemide  20 mg Oral Daily  . lisinopril  5 mg Oral Daily  . mouth rinse  15 mL Mouth Rinse BID  . pantoprazole  40 mg Oral Daily  . polyethylene glycol  17 g Oral Daily  . sodium chloride flush  3 mL Intravenous Q12H  . tamsulosin  0.8 mg Oral Daily   Continuous Infusions:   LOS: 13 days   Adrianah Prophete  [facility]   *Please refer to [email or web address], password [email or web address] to get updated schedule on who will round on this patient, as hospitalists switch teams weekly. If 7PM-7AM, please contact night-coverage at [email or web address], password [email or web address] for any overnight needs.  05/06/2019, 3:58 PM

## 2019-05-06 NOTE — Progress Notes (Addendum)
Daily Progress Note   Patient Name: Joshua Petersen       Date: 05/06/2019 DOB: 26-Apr-1956  Age: 63 y.o. MRN#: [id number] Attending Physician: Lurene Shadow, MD Primary Care Physician: Dione Housekeeper, MD Admit Date: 04/23/2019  Reason for Consultation/Follow-up: Establishing goals of care  Subjective: Patient is resting in bed. He states "hi" upon being spoken to. He does not answer when asked how he is doing. He did not answer when asked if he has pain. Appetite waxes and wanes. CM working on hospice facility placement.   Recommend hospice facility placement or nursing facility with hospice. Per notes, patient is unable to D/C to [facility]. Unsure of an accepting facility at this time. Will leave order for Suboxone in place at this time. If need arises to change Suboxone to narcotic pain relief, would recommend Oxycodone. Per care everywhere pain clinic and orthopedic notes, patient has been followed by [facility] pain clinic for arthritis pain with multimodal therapy attempted. His oxycodone was tapered off and transitioned to Suboxone.    2mg  of Buprenorphine is equivalent to 75mg  of Oxycodone. Scheduled dose reduction would be recommended as he has been hospitalized for the past 13 days.  Could try 15mg  Oxycontin BID and oxycodone immediate release 5-10mg  q 4-6 hours PRN.         Length of Stay: 13  Current Medications: Scheduled Meds:  . buprenorphine-naloxone  1 tablet Sublingual Daily  . carvedilol  12.5 mg Oral BID WC  . feeding supplement (ENSURE ENLIVE)  237 mL Oral TID BM  . finasteride  5 mg Oral Daily  . furosemide  20 mg Oral Daily  . lisinopril  5 mg Oral Daily  . mouth rinse  15 mL Mouth Rinse BID  . pantoprazole  40 mg Oral Daily  . polyethylene glycol  17 g Oral Daily   . sodium chloride flush  3 mL Intravenous Q12H  . tamsulosin  0.8 mg Oral Daily    Continuous Infusions:   PRN Meds: acetaminophen **OR** acetaminophen, albuterol, antiseptic oral rinse, bisacodyl, glycopyrrolate **OR** glycopyrrolate **OR** glycopyrrolate, haloperidol **OR** haloperidol **OR** haloperidol lactate, LORazepam **OR** LORazepam **OR** LORazepam, ondansetron **OR** ondansetron (ZOFRAN) IV, polyvinyl alcohol, sodium chloride flush  Physical Exam Pulmonary:     Effort: Pulmonary effort is normal.  Neurological:     Mental Status:  He is alert.     Comments: Somewhat confused but improving.              Vital Signs: BP (!) 144/92 (BP Location: Right Arm)   Pulse 77   Temp 97.8 F (36.6 C)   Resp 17   Ht 5\' 8"  (1.727 m)   Wt 126.4 kg   SpO2 95%   BMI 42.37 kg/m  SpO2: SpO2: 95 % O2 Device: O2 Device: Nasal Cannula O2 Flow Rate: O2 Flow Rate (L/min): 2 L/min  Intake/output summary:   Intake/Output Summary (Last 24 hours) at 05/06/2019 1516 Last data filed at 05/06/2019 1300 Gross per 24 hour  Intake 0 ml  Output 975 ml  Net -975 ml   LBM: Last BM Date: 04/28/19 Baseline Weight: Weight: 122.5 kg Most recent weight: Weight: 126.4 kg       Palliative Assessment/Data:30%      Patient Active Problem List   Diagnosis Date Noted  . Generalized weakness   . Alcohol abuse 05/02/2019  . Alcoholic cardiomyopathy ([place])   . Hypernatremia   . Opioid abuse ([place])   . Acute metabolic encephalopathy   . Essential hypertension   . Acute systolic CHF (congestive heart failure) ([place]) 04/28/2019  . Alcohol withdrawal syndrome with complication, with unspecified complication ([place]) 78/24/2353  . Acute respiratory failure with hypoxia and hypercapnia ([place]) 04/24/2019  . AKI (acute kidney injury) ([place]) 04/24/2019  . Drug overdose 04/23/2019  . Status post total knee replacement using cement, right 12/11/2018  . Chronic pain of right knee 06/05/2018  . 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 Primary osteoarthritis of right knee 05/08/2018  . 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 Morbid obesity with BMI of 40.0-44.9, adult ([place]) 12/28/2015    Palliative Care Assessment & Plan    Recommendations/Plan: Shifted to comfort care. Requesting hospice facility placement. CM working on further placement with hospice as [facility] is unable to accept patient. Called son unsuccessfully, VM left.    Code Status:    Code Status Orders  (From admission, onward)         Start     Ordered   04/23/19 0701  Full code  Continuous     04/23/19 0702        Code Status History    Date Active Date Inactive Code Status Order ID Comments User Context   12/11/2018 1131 12/12/2018 1530 Full Code [id number]  Corky Mull, MD Inpatient   03/12/2017 1246 03/15/2017 1756 Full Code [id number]  Poggi, Marshall Cork, MD Inpatient   Advance Care Planning Activity       Prognosis:   < 2 weeks  Waxing and waning appetite. Acute metabolic encephalopathy, AKI, systolic heart failure with EF of 25-30%. ETOH abuse.     Care plan was discussed with RN  Thank you for allowing the Palliative Medicine Team to assist in the care of this patient.   Total Time 25 min Prolonged Time Billed no      Greater than 50%  of this time was spent counseling and coordinating care related to the above assessment and plan.  Asencion Gowda, NP  Please contact Palliative Medicine Team phone at [phone] for questions and concerns.

## 2019-05-06 NOTE — Progress Notes (Signed)
Spoke with Mickle Plumb and made his aware that the patient has had 8 PIVs in 13days and current has no IV meds ordered, patient's meds are PO. If IV is needed education given to re enter IV team consult

## 2019-05-06 NOTE — Progress Notes (Signed)
PT Cancellation Note  Patient Details Name: Joshua Petersen MRN: [id number] DOB: Mar 21, 1956   Cancelled Treatment:    Reason Eval/Treat Not Completed: Other (comment). Per chart review and secure chat with nursing POC for this pt is residential hospice. Pt has also been unable to fully participate with PT and unable to get EOB. At this time PT signing off.   Zachary George PT, [place] 12:57 PM,05/06/19 [phone]   Joshua Petersen 05/06/2019, 12:55 PM

## 2019-05-06 NOTE — Telephone Encounter (Addendum)
The patient is still currently admitted. Will reassess discharge status tomorrow for a possible TCM call at that time.  

## 2019-05-07 MED ORDER — OXYCODONE HCL 5 MG PO TABS
10.0000 mg | ORAL_TABLET | ORAL | Status: DC | PRN
  Administered 2019-05-08: 20:00:00 10 mg via ORAL
  Filled 2019-05-07: qty 2

## 2019-05-07 MED ORDER — OXYCODONE HCL ER 15 MG PO T12A
15.0000 mg | EXTENDED_RELEASE_TABLET | Freq: Two times a day (BID) | ORAL | Status: DC
  Administered 2019-05-08 – 2019-05-12 (×9): 15 mg via ORAL
  Filled 2019-05-07 (×9): qty 1

## 2019-05-07 MED ORDER — OXYCODONE HCL 5 MG PO TABS
5.0000 mg | ORAL_TABLET | ORAL | Status: DC | PRN
  Administered 2019-05-07: 5 mg via ORAL
  Filled 2019-05-07: qty 1

## 2019-05-07 MED ORDER — OXYCODONE HCL 5 MG PO TABS: 5.0000 mg | ORAL_TABLET | Freq: Three times a day (TID) | ORAL | Status: DC | PRN

## 2019-05-07 NOTE — Telephone Encounter (Signed)
The patient is still currently admitted. Will reassess discharge status on Monday 12/28 for a possible TCM call at that time

## 2019-05-07 NOTE — Progress Notes (Signed)
Progress Note    Joshua Petersen  [id number] DOB: 1955-05-16  DOA: 04/23/2019 PCP: [name], MD        Assessment/Plan:   Principal Problem:   Drug overdose Active Problems:   Alcohol withdrawal syndrome with complication, with unspecified complication ([place])   Acute respiratory failure with hypoxia and hypercapnia (HCC)   AKI (acute kidney injury) ([place])   Acute systolic CHF (congestive heart failure) ([place])   Essential hypertension   Opioid abuse ([place])   Acute metabolic encephalopathy   Alcoholic cardiomyopathy (HCC)   Hypernatremia   Alcohol abuse   Generalized weakness   Body mass index is 42.37 kg/m.  (Morbid obesity)   Acute systolic heart failure with fluid overload/acute pulmonary edema: 2D echo on 04/27/2019 showed EF of 25 to 30%.  VQ scan showed low probability for PE.  Continue Lasix and lisinopril.  Hypertension: Lisinopril  Acute hypoxemic respiratory failure: Oxygen for comfort.  Taper off as able.  BPH with chronic urinary retention: Continue Foley catheter and Flomax.  Alcohol abuse with alcohol withdrawal syndrome: He required Precedex drip for sedation earlier on admission.  Alcohol withdrawal syndrome has resolved.  Recent drug overdose (on admission) with acute toxic metabolic encephalopathy: Patient is still confused.  Alcohol abuse may be playing a role in persistent confusion.  No evidence of stroke on MRI.  AKI and hypernatremia: Resolved   Chronic pain syndrome: Analgesics as needed for pain.  Patient has been evaluated by palliative care team and hospice team.  Plan to discharge to hospice once a bed becomes available.  He was on Suboxone which was making disposition to SNF difficult but this has been discontinued.  Family Communication/Anticipated D/C date and plan/Code Status   DVT prophylaxis: Comfort measures Code Status: DNR on comfort measures Family Communication: None Disposition Plan: Plan to discharge to  hospice house in 1 to 2 days depending on availability      Subjective:   He is still confused.  He is unable to provide any history.  Objective:    Vitals:   05/06/19 0910 05/06/19 1734 05/07/19 0510 05/07/19 0758  BP: (!) 144/92 129/75 (!) 161/85 132/82  Pulse: 77 73 77 79  Resp:   18 15  Temp:   99.2 F (37.3 C) 98.1 F (36.7 C)  TempSrc:   Oral Oral  SpO2:   98% 96%  Weight:      Height:        Intake/Output Summary (Last 24 hours) at 05/07/2019 1552 Last data filed at 05/07/2019 5361 Gross per 24 hour  Intake 240 ml  Output 1200 ml  Net -960 ml   Filed Weights   04/22/19 2216 04/23/19 0922  Weight: 122.5 kg 126.4 kg    Exam:  GEN: NAD SKIN: No rash EYES: Anicteric ENT: MMM CV: RRR PULM: CTA B ABD: soft, obese, NT, +BS CNS: AAO x 3, non focal EXT: No edema or tenderness GU: Foley catheter draining amber urine   Data Reviewed:   I have personally reviewed following labs and imaging studies:  Labs: Labs show the following:   Basic Metabolic Panel: Recent Labs  Lab 05/02/19 0538 05/03/19 0832  NA 150* 144  K 3.5  --   CL 105  --   CO2 34*  --   GLUCOSE 132*  --   BUN 42*  --   CREATININE 1.10  --   CALCIUM 8.6*  --    GFR Estimated Creatinine Clearance: 87.9 mL/min (  by C-G formula based on SCr of 1.1 mg/dL). Liver Function Tests: No results for input(s): AST, ALT, ALKPHOS, BILITOT, PROT, ALBUMIN in the last 168 hours. No results for input(s): LIPASE, AMYLASE in the last 168 hours. No results for input(s): AMMONIA in the last 168 hours. Coagulation profile No results for input(s): INR, PROTIME in the last 168 hours.  CBC: No results for input(s): WBC, NEUTROABS, HGB, HCT, MCV, PLT in the last 168 hours. Cardiac Enzymes: No results for input(s): CKTOTAL, CKMB, CKMBINDEX, TROPONINI in the last 168 hours. BNP (last 3 results) No results for input(s): PROBNP in the last 8760 hours. CBG: Recent Labs  Lab 05/03/19 1136  05/05/19 1127  GLUCAP 140* 117*   D-Dimer: No results for input(s): DDIMER in the last 72 hours. Hgb A1c: No results for input(s): HGBA1C in the last 72 hours. Lipid Profile: No results for input(s): CHOL, HDL, LDLCALC, TRIG, CHOLHDL, LDLDIRECT in the last 72 hours. Thyroid function studies: No results for input(s): TSH, T4TOTAL, T3FREE, THYROIDAB in the last 72 hours.  Invalid input(s): FREET3 Anemia work up: No results for input(s): VITAMINB12, FOLATE, FERRITIN, TIBC, IRON, RETICCTPCT in the last 72 hours. Sepsis Labs: No results for input(s): PROCALCITON, WBC, LATICACIDVEN in the last 168 hours.  Microbiology No results found for this or any previous visit (from the past 240 hour(s)).  Procedures and diagnostic studies:  No results found.  Medications:   . carvedilol  12.5 mg Oral BID WC  . feeding supplement (ENSURE ENLIVE)  237 mL Oral TID BM  . finasteride  5 mg Oral Daily  . furosemide  20 mg Oral Daily  . lisinopril  5 mg Oral Daily  . mouth rinse  15 mL Mouth Rinse BID  . [START ON 05/08/2019] oxyCODONE  15 mg Oral Q12H  . pantoprazole  40 mg Oral Daily  . polyethylene glycol  17 g Oral Daily  . sodium chloride flush  3 mL Intravenous Q12H  . tamsulosin  0.8 mg Oral Daily   Continuous Infusions:   LOS: 14 days   Estellar Cadena  [facility]   *Please refer to [email or web address], password [email or web address] to get updated schedule on who will round on this patient, as hospitalists switch teams weekly. If 7PM-7AM, please contact night-coverage at [email or web address], password [email or web address] for any overnight needs.  05/07/2019, 3:52 PM

## 2019-05-07 NOTE — TOC Progression Note (Signed)
Transition of Care [facility]) - Progression Note    Patient Details  Name: Joshua Petersen MRN: [id number] Date of Birth: 10/26/1955  [facility] CM/SW Contact  Shelbie Hutching, RN Phone Number: 05/07/2019, 11:53 AM  Clinical Narrative:    MD has discontinued suboxone.  Skilled nursing facilities in the area are not able to accept a patient on suboxone.  [facility] contacted yesterday and left message, no one has returned the call.  [facility] will look at patient to see if they can offer a bed, but it will probably be next week due to the holidays.    Expected Discharge Plan: Long Term Nursing Home Barriers to Discharge: Continued Medical Work up  Expected Discharge Plan and Services Expected Discharge Plan: [place]       Living arrangements for the past 2 months: Single Family Home                                       Social Determinants of Health (SDOH) Interventions    Readmission Risk Interventions Readmission Risk Prevention Plan 04/24/2019  Medication Review (RN Care Manager) Complete  Palliative Care Screening Not Applicable  Some recent data might be hidden

## 2019-05-08 NOTE — Progress Notes (Signed)
Progress Note    Joshua Petersen  [id number]:[id number] DOB: [birth year]  DOA: 04/23/2019 PCP: Dione Housekeeper, MD        Assessment/Plan:   Principal Problem:   Drug overdose Active Problems:   Alcohol withdrawal syndrome with complication, with unspecified complication (HCC)   Acute respiratory failure with hypoxia and hypercapnia (HCC)   AKI (acute kidney injury) (HCC)   Acute systolic CHF (congestive heart failure) (HCC)   Essential hypertension   Opioid abuse (HCC)   Acute metabolic encephalopathy   Alcoholic cardiomyopathy (HCC)   Hypernatremia   Alcohol abuse   Generalized weakness   Body mass index is 42.37 kg/m.  (Morbid obesity)   Acute systolic heart failure with fluid overload/acute pulmonary edema: 2D echo on 04/27/2019 showed EF of 25 to 30%.  VQ scan showed low probability for PE.  Discontinue medications and continue with comfort measures  Hypertension: Discontinue antihypertensives  Acute hypoxemic respiratory failure: Oxygen for comfort.  Taper off as able.  BPH with chronic urinary retention: Continue Foley catheter  Alcohol abuse with alcohol withdrawal syndrome: He required Precedex drip for sedation earlier on admission.  Alcohol withdrawal syndrome has resolved.  Recent drug overdose (on admission) with acute toxic metabolic encephalopathy: Patient is still confused.  Alcohol abuse may be playing a role in persistent confusion.  No evidence of stroke on MRI.  AKI and hypernatremia: Resolved   Chronic pain syndrome: Continue analgesics.  Awaiting placement to SNF with hospice  Family Communication/Anticipated D/C date and plan/Code Status   DVT prophylaxis: Comfort measures Code Status: DNR on comfort measures Family Communication: None Disposition Plan: Plan to discharge to hospice house in 1 to 2 days depending on availability      Subjective:   Patient is confused and unable to provide any history.  He tries to regular  conversation but does not make any meaningful conversation   Objective:    Vitals:   05/07/19 0510 05/07/19 0758 05/07/19 1657 05/08/19 0411  BP: (!) 161/85 132/82 118/70 119/62  Pulse: 77 79 70 77  Resp: 18 15  20   Temp: 99.2 F (37.3 C) 98.1 F (36.7 C)  99.4 F (37.4 C)  TempSrc: Oral Oral  Oral  SpO2: 98% 96%  95%  Weight:      Height:        Intake/Output Summary (Last 24 hours) at 05/08/2019 1005 Last data filed at 05/08/2019 05/10/2019 Gross per 24 hour  Intake 840 ml  Output 900 ml  Net -60 ml   Filed Weights   04/22/19 2216 04/23/19 0922  Weight: 122.5 kg 126.4 kg    Exam:  GEN: NAD SKIN: No rash EYES: EOMI ENT: MMM CV: RRR PULM: CTA B ABD: soft, obese, NT, +BS CNS: Alert but confused, non focal EXT: No edema or tenderness GU: Foley catheter draining amber urine    Data Reviewed:   I have personally reviewed following labs and imaging studies:  Labs: Labs show the following:   Basic Metabolic Panel: Recent Labs  Lab 05/02/19 0538 05/03/19 0832  NA 150* 144  K 3.5  --   CL 105  --   CO2 34*  --   GLUCOSE 132*  --   BUN 42*  --   CREATININE 1.10  --   CALCIUM 8.6*  --    GFR Estimated Creatinine Clearance: 87.9 mL/min (by C-G formula based on SCr of 1.1 mg/dL). Liver Function Tests: No results for input(s): AST, ALT, ALKPHOS, BILITOT,  PROT, ALBUMIN in the last 168 hours. No results for input(s): LIPASE, AMYLASE in the last 168 hours. No results for input(s): AMMONIA in the last 168 hours. Coagulation profile No results for input(s): INR, PROTIME in the last 168 hours.  CBC: No results for input(s): WBC, NEUTROABS, HGB, HCT, MCV, PLT in the last 168 hours. Cardiac Enzymes: No results for input(s): CKTOTAL, CKMB, CKMBINDEX, TROPONINI in the last 168 hours. BNP (last 3 results) No results for input(s): PROBNP in the last 8760 hours. CBG: Recent Labs  Lab 05/03/19 1136 05/05/19 1127  GLUCAP 140* 117*   D-Dimer: No results for  input(s): DDIMER in the last 72 hours. Hgb A1c: No results for input(s): HGBA1C in the last 72 hours. Lipid Profile: No results for input(s): CHOL, HDL, LDLCALC, TRIG, CHOLHDL, LDLDIRECT in the last 72 hours. Thyroid function studies: No results for input(s): TSH, T4TOTAL, T3FREE, THYROIDAB in the last 72 hours.  Invalid input(s): FREET3 Anemia work up: No results for input(s): VITAMINB12, FOLATE, FERRITIN, TIBC, IRON, RETICCTPCT in the last 72 hours. Sepsis Labs: No results for input(s): PROCALCITON, WBC, LATICACIDVEN in the last 168 hours.  Microbiology No results found for this or any previous visit (from the past 240 hour(s)).  Procedures and diagnostic studies:  No results found.  Medications:   . feeding supplement (ENSURE ENLIVE)  237 mL Oral TID BM  . mouth rinse  15 mL Mouth Rinse BID  . oxyCODONE  15 mg Oral Q12H  . pantoprazole  40 mg Oral Daily  . polyethylene glycol  17 g Oral Daily  . sodium chloride flush  3 mL Intravenous Q12H   Continuous Infusions:   LOS: 15 days   Skyeler Smola  [facility]   *Please refer to [email or web address], password [email or web address] to get updated schedule on who will round on this patient, as hospitalists switch teams weekly. If 7PM-7AM, please contact night-coverage at [email or web address], password [email or web address] for any overnight needs.  05/08/2019, 10:05 AM

## 2019-05-09 DIAGNOSIS — R531 Weakness: Secondary | ICD-10-CM

## 2019-05-09 DIAGNOSIS — I426 Alcoholic cardiomyopathy: Secondary | ICD-10-CM

## 2019-05-09 NOTE — Progress Notes (Signed)
Progress Note    Joshua Petersen  [id number] DOB: [birth year]  DOA: 04/23/2019 PCP: Joshua Castle, MD        Assessment/Plan:   Principal Problem:   Drug overdose Active Problems:   Alcohol withdrawal syndrome with complication, with unspecified complication ([place])   Acute respiratory failure with hypoxia and hypercapnia (HCC)   AKI (acute kidney injury) ([place])   Acute systolic CHF (congestive heart failure) ([place])   Essential hypertension   Opioid abuse ([place])   Acute metabolic encephalopathy   Alcoholic cardiomyopathy (HCC)   Hypernatremia   Alcohol abuse   Generalized weakness   Body mass index is 42.37 kg/m.  (Morbid obesity)   Acute systolic heart failure with fluid overload/acute pulmonary edema: 2D echo on 04/27/2019 showed EF of 25 to 30%.  VQ scan showed low probability for PE.    Hypertension: Off antihypertensives  Acute hypoxemic respiratory failure: Oxygen for comfort.  Taper off as able.  BPH with chronic urinary retention: Continue Foley catheter  Alcohol abuse with alcohol withdrawal syndrome: He required Precedex drip for sedation earlier on admission.  Alcohol withdrawal syndrome has resolved.  Recent drug overdose (on admission) with acute toxic metabolic encephalopathy: Patient is still confused.  Alcohol abuse may be playing a role in persistent confusion.  No evidence of stroke on MRI.  AKI and hypernatremia: Resolved   Chronic pain syndrome: Continue analgesics.  Awaiting placement to SNF with hospice   Patient is on comfort measures.  Family Communication/Anticipated D/C date and plan/Code Status   DVT prophylaxis: Comfort measures Code Status: DNR on comfort measures Family Communication: None Disposition Plan: Plan to discharge to hospice house in 1 to 2 days depending on availability      Subjective:    Patient was asleep when I walked into his room.  He woke up to try and talk to me but he was not alert enough to  participate in any meaningful conversation.   Objective:    Vitals:   05/08/19 0411 05/08/19 1508 05/09/19 0358 05/09/19 0739  BP: 119/62 (!) 107/55 133/73 (!) 144/67  Pulse: 77 63 68 64  Resp: 20  20   Temp: 99.4 F (37.4 C) 98.2 F (36.8 C) 98.5 F (36.9 C) 98.7 F (37.1 C)  TempSrc: Oral  Oral Oral  SpO2: 95% 96% 96% 94%  Weight:      Height:        Intake/Output Summary (Last 24 hours) at 05/09/2019 1033 Last data filed at 05/09/2019 0962 Gross per 24 hour  Intake --  Output 700 ml  Net -700 ml   Filed Weights   04/22/19 2216 04/23/19 0922  Weight: 122.5 kg 126.4 kg    Exam:  GEN: NAD SKIN: No rash EYES: anicteric ENT: MMM CV: RRR PULM: CTA B ABD: soft, obese, NT, +BS CNS: drowsy but arousable,  EXT: No edema or tenderness     Data Reviewed:   I have personally reviewed following labs and imaging studies:  Labs: Labs show the following:   Basic Metabolic Panel: Recent Labs  Lab 05/03/19 0832  NA 144   GFR Estimated Creatinine Clearance: 87.9 mL/min (by C-G formula based on SCr of 1.1 mg/dL). Liver Function Tests: No results for input(s): AST, ALT, ALKPHOS, BILITOT, PROT, ALBUMIN in the last 168 hours. No results for input(s): LIPASE, AMYLASE in the last 168 hours. No results for input(s): AMMONIA in the last 168 hours. Coagulation profile No results for input(s): INR, PROTIME in  the last 168 hours.  CBC: No results for input(s): WBC, NEUTROABS, HGB, HCT, MCV, PLT in the last 168 hours. Cardiac Enzymes: No results for input(s): CKTOTAL, CKMB, CKMBINDEX, TROPONINI in the last 168 hours. BNP (last 3 results) No results for input(s): PROBNP in the last 8760 hours. CBG: Recent Labs  Lab 05/03/19 1136 05/05/19 1127  GLUCAP 140* 117*   D-Dimer: No results for input(s): DDIMER in the last 72 hours. Hgb A1c: No results for input(s): HGBA1C in the last 72 hours. Lipid Profile: No results for input(s): CHOL, HDL, LDLCALC, TRIG,  CHOLHDL, LDLDIRECT in the last 72 hours. Thyroid function studies: No results for input(s): TSH, T4TOTAL, T3FREE, THYROIDAB in the last 72 hours.  Invalid input(s): FREET3 Anemia work up: No results for input(s): VITAMINB12, FOLATE, FERRITIN, TIBC, IRON, RETICCTPCT in the last 72 hours. Sepsis Labs: No results for input(s): PROCALCITON, WBC, LATICACIDVEN in the last 168 hours.  Microbiology No results found for this or any previous visit (from the past 240 hour(s)).  Procedures and diagnostic studies:  No results found.  Medications:   . feeding supplement (ENSURE ENLIVE)  237 mL Oral TID BM  . mouth rinse  15 mL Mouth Rinse BID  . oxyCODONE  15 mg Oral Q12H  . pantoprazole  40 mg Oral Daily  . polyethylene glycol  17 g Oral Daily  . sodium chloride flush  3 mL Intravenous Q12H   Continuous Infusions:   LOS: 16 days   Joshua Petersen  [facility]   *Please refer to [email or web address], password [email or web address] to get updated schedule on who will round on this patient, as hospitalists switch teams weekly. If 7PM-7AM, please contact night-coverage at [email or web address], password [email or web address] for any overnight needs.  05/09/2019, 10:33 AM

## 2019-05-10 DIAGNOSIS — F1010 Alcohol abuse, uncomplicated: Secondary | ICD-10-CM

## 2019-05-10 MED ORDER — LORAZEPAM 2 MG/ML PO CONC
1.0000 mg | ORAL | Status: DC | PRN
  Filled 2019-05-10: qty 0.5

## 2019-05-10 MED ORDER — HALOPERIDOL 1 MG PO TABS
1.0000 mg | ORAL_TABLET | ORAL | Status: DC | PRN
  Filled 2019-05-10: qty 1

## 2019-05-10 MED ORDER — LORAZEPAM 2 MG PO TABS: 2.0000 mg | ORAL_TABLET | ORAL | Status: DC | PRN

## 2019-05-10 MED ORDER — HALOPERIDOL LACTATE 5 MG/ML IJ SOLN: 5.0000 mg | INTRAMUSCULAR | Status: DC | PRN

## 2019-05-10 MED ORDER — HALOPERIDOL LACTATE 2 MG/ML PO CONC
0.5000 mg | ORAL | Status: DC | PRN
  Filled 2019-05-10: qty 0.3

## 2019-05-10 MED ORDER — LORAZEPAM 2 MG/ML IJ SOLN: 2.0000 mg | INTRAMUSCULAR | Status: DC | PRN

## 2019-05-10 NOTE — Progress Notes (Addendum)
Progress Note    Joshua Petersen  UYQ:[id number] DOB: [birth year]  DOA: 04/23/2019 PCP: Dione Housekeeper, MD        Assessment/Plan:   Principal Problem:   Drug overdose Active Problems:   Alcohol withdrawal syndrome with complication, with unspecified complication (HCC)   Acute respiratory failure with hypoxia and hypercapnia (HCC)   AKI (acute kidney injury) (HCC)   Acute systolic CHF (congestive heart failure) (HCC)   Essential hypertension   Opioid abuse (HCC)   Acute metabolic encephalopathy   Alcoholic cardiomyopathy (HCC)   Hypernatremia   Alcohol abuse   Generalized weakness   Body mass index is 42.37 kg/m.  (Morbid obesity)   Acute systolic heart failure with fluid overload/acute pulmonary edema: 2D echo on 04/27/2019 showed EF of 25 to 30%.  VQ scan showed low probability for PE.    Hypertension: Off antihypertensives  Acute hypoxemic respiratory failure: Oxygen for comfort.  Taper off as able.  BPH with chronic urinary retention: Continue Foley catheter  Alcohol abuse with alcohol withdrawal syndrome/delirium: He required Precedex drip for sedation earlier on admission.  Alcohol withdrawal syndrome has resolved.  Increased dose of Haldol/Ativan as needed for agitation.  Recent drug overdose (on admission) with acute toxic metabolic encephalopathy: Patient is still confused.  Alcohol abuse may be playing a role in persistent confusion.  No evidence of stroke on MRI.  AKI and hypernatremia: Resolved   Chronic pain syndrome: Continue analgesics.  Awaiting placement to SNF with hospice   Patient is on comfort measures.  Family Communication/Anticipated D/C date and plan/Code Status   DVT prophylaxis: Comfort measures Code Status: DNR on comfort measures Family Communication: None Disposition Plan: Plan to discharge to hospice house in 1 to 2 days depending on availability      Subjective:    Patient has no complaints.  However according  to nursing staff, he has had intermittent agitation and the low-dose Haldol/Ativan were inadequate.   Objective:    Vitals:   05/09/19 0358 05/09/19 0739 05/10/19 0413 05/10/19 0755  BP: 133/73 (!) 144/67 (!) 144/85 (!) 145/86  Pulse: 68 64 70 70  Resp: 20  20 18   Temp: 98.5 F (36.9 C) 98.7 F (37.1 C) 98.2 F (36.8 C) 98.6 F (37 C)  TempSrc: Oral Oral Oral   SpO2: 96% 94% 95% 97%  Weight:      Height:        Intake/Output Summary (Last 24 hours) at 05/10/2019 1212 Last data filed at 05/10/2019 0500 Gross per 24 hour  Intake --  Output 1200 ml  Net -1200 ml   Filed Weights   04/22/19 2216 04/23/19 0922  Weight: 122.5 kg 126.4 kg    Exam:  GEN: NAD SKIN: No rash EYES: EOMI ENT: MMM CV: RRR PULM: CTA B ABD: soft, ND, NT, +BS CNS: Alert but confused, non focal EXT: No edema or tenderness GU: Foley catheter draining amber urine      Data Reviewed:   I have personally reviewed following labs and imaging studies:  Labs: Labs show the following:   Basic Metabolic Panel: No results for input(s): NA, K, CL, CO2, GLUCOSE, BUN, CREATININE, CALCIUM, MG, PHOS in the last 168 hours. GFR Estimated Creatinine Clearance: 87.9 mL/min (by C-G formula based on SCr of 1.1 mg/dL). Liver Function Tests: No results for input(s): AST, ALT, ALKPHOS, BILITOT, PROT, ALBUMIN in the last 168 hours. No results for input(s): LIPASE, AMYLASE in the last 168 hours. No results for  input(s): AMMONIA in the last 168 hours. Coagulation profile No results for input(s): INR, PROTIME in the last 168 hours.  CBC: No results for input(s): WBC, NEUTROABS, HGB, HCT, MCV, PLT in the last 168 hours. Cardiac Enzymes: No results for input(s): CKTOTAL, CKMB, CKMBINDEX, TROPONINI in the last 168 hours. BNP (last 3 results) No results for input(s): PROBNP in the last 8760 hours. CBG: Recent Labs  Lab 05/05/19 1127  GLUCAP 117*   D-Dimer: No results for input(s): DDIMER in the last 72  hours. Hgb A1c: No results for input(s): HGBA1C in the last 72 hours. Lipid Profile: No results for input(s): CHOL, HDL, LDLCALC, TRIG, CHOLHDL, LDLDIRECT in the last 72 hours. Thyroid function studies: No results for input(s): TSH, T4TOTAL, T3FREE, THYROIDAB in the last 72 hours.  Invalid input(s): FREET3 Anemia work up: No results for input(s): VITAMINB12, FOLATE, FERRITIN, TIBC, IRON, RETICCTPCT in the last 72 hours. Sepsis Labs: No results for input(s): PROCALCITON, WBC, LATICACIDVEN in the last 168 hours.  Microbiology No results found for this or any previous visit (from the past 240 hour(s)).  Procedures and diagnostic studies:  No results found.  Medications:   . feeding supplement (ENSURE ENLIVE)  237 mL Oral TID BM  . mouth rinse  15 mL Mouth Rinse BID  . oxyCODONE  15 mg Oral Q12H  . pantoprazole  40 mg Oral Daily  . polyethylene glycol  17 g Oral Daily  . sodium chloride flush  3 mL Intravenous Q12H   Continuous Infusions:   LOS: 17 days   Antinio Sanderfer  [facility]   *Please refer to [email or web address], password [email or web address] to get updated schedule on who will round on this patient, as hospitalists switch teams weekly. If 7PM-7AM, please contact night-coverage at [email or web address], password [email or web address] for any overnight needs.  05/10/2019, 12:12 PM

## 2019-05-11 LAB — SARS CORONAVIRUS 2 (TAT 6-24 HRS): SARS Coronavirus 2: NEGATIVE

## 2019-05-11 NOTE — Progress Notes (Addendum)
Progress Note    Joshua Petersen  VOZ:[id number] DOB: May 17, 1955  DOA: 04/23/2019 PCP: Dione Housekeeper, MD        Assessment/Plan:   Principal Problem:   Drug overdose Active Problems:   Alcohol withdrawal syndrome with complication, with unspecified complication (HCC)   Acute respiratory failure with hypoxia and hypercapnia (HCC)   AKI (acute kidney injury) (HCC)   Acute systolic CHF (congestive heart failure) (HCC)   Essential hypertension   Opioid abuse (HCC)   Acute metabolic encephalopathy   Alcoholic cardiomyopathy (HCC)   Hypernatremia   Alcohol abuse   Generalized weakness   Body mass index is 42.37 kg/m.  (Morbid obesity)   Acute systolic heart failure with fluid overload/acute pulmonary edema: 2D echo on 04/27/2019 showed EF of 25 to 30%.  VQ scan showed low probability for PE.    Hypertension: Off antihypertensives  Acute hypoxemic respiratory failure: Resolved.  He is tolerating room air.  BPH with chronic urinary retention: Continue Foley catheter  Alcohol abuse with alcohol withdrawal syndrome/delirium: He required Precedex drip for sedation earlier on admission.  Alcohol withdrawal syndrome has resolved.  Increased dose of Haldol/Ativan as needed for agitation.  Recent drug overdose (on admission) with acute toxic metabolic encephalopathy: Patient is still confused.  Alcohol abuse may be playing a role in persistent confusion.  No evidence of stroke on MRI.  AKI and hypernatremia: Resolved   Chronic pain syndrome: Continue analgesics.  Awaiting placement to SNF with hospice   Patient is on comfort measures.  I was informed by social worker to repeat Covid test today for placement and this has been ordered.  Family Communication/Anticipated D/C date and plan/Code Status   DVT prophylaxis: Comfort measures Code Status: DNR on comfort measures Family Communication: None Disposition Plan: Plan to discharge to SNF with hospicetomorrow  depending on availability/insurance authorization      Subjective:   No complaints.  He is more conversant today but he's still confused.   Objective:    Vitals:   05/10/19 0413 05/10/19 0755 05/11/19 0553 05/11/19 0808  BP: (!) 144/85 (!) 145/86 (!) 144/122 132/74  Pulse: 70 70 85 77  Resp: 20 18 16 17   Temp: 98.2 F (36.8 C) 98.6 F (37 C) 98.7 F (37.1 C) 98.4 F (36.9 C)  TempSrc: Oral  Oral   SpO2: 95% 97% 95% 95%  Weight:      Height:        Intake/Output Summary (Last 24 hours) at 05/11/2019 1054 Last data filed at 05/11/2019 0554 Gross per 24 hour  Intake --  Output 1325 ml  Net -1325 ml   Filed Weights   04/22/19 2216 04/23/19 0922  Weight: 122.5 kg 126.4 kg    Exam:   GEN: NAD SKIN: No rash EYES: EOMI ENT: MMM CV: RRR PULM: CTA B ABD: soft, obese, NT, +BS CNS: AAO x 1 (person), non focal EXT: No edema or tenderness       Data Reviewed:   I have personally reviewed following labs and imaging studies:  Labs: Labs show the following:   Basic Metabolic Panel: No results for input(s): NA, K, CL, CO2, GLUCOSE, BUN, CREATININE, CALCIUM, MG, PHOS in the last 168 hours. GFR Estimated Creatinine Clearance: 87.9 mL/min (by C-G formula based on SCr of 1.1 mg/dL). Liver Function Tests: No results for input(s): AST, ALT, ALKPHOS, BILITOT, PROT, ALBUMIN in the last 168 hours. No results for input(s): LIPASE, AMYLASE in the last 168 hours. No  results for input(s): AMMONIA in the last 168 hours. Coagulation profile No results for input(s): INR, PROTIME in the last 168 hours.  CBC: No results for input(s): WBC, NEUTROABS, HGB, HCT, MCV, PLT in the last 168 hours. Cardiac Enzymes: No results for input(s): CKTOTAL, CKMB, CKMBINDEX, TROPONINI in the last 168 hours. BNP (last 3 results) No results for input(s): PROBNP in the last 8760 hours. CBG: Recent Labs  Lab 05/05/19 1127  GLUCAP 117*   D-Dimer: No results for input(s): DDIMER in the  last 72 hours. Hgb A1c: No results for input(s): HGBA1C in the last 72 hours. Lipid Profile: No results for input(s): CHOL, HDL, LDLCALC, TRIG, CHOLHDL, LDLDIRECT in the last 72 hours. Thyroid function studies: No results for input(s): TSH, T4TOTAL, T3FREE, THYROIDAB in the last 72 hours.  Invalid input(s): FREET3 Anemia work up: No results for input(s): VITAMINB12, FOLATE, FERRITIN, TIBC, IRON, RETICCTPCT in the last 72 hours. Sepsis Labs: No results for input(s): PROCALCITON, WBC, LATICACIDVEN in the last 168 hours.  Microbiology No results found for this or any previous visit (from the past 240 hour(s)).  Procedures and diagnostic studies:  No results found.  Medications:   . feeding supplement (ENSURE ENLIVE)  237 mL Oral TID BM  . mouth rinse  15 mL Mouth Rinse BID  . oxyCODONE  15 mg Oral Q12H  . pantoprazole  40 mg Oral Daily  . polyethylene glycol  17 g Oral Daily   Continuous Infusions:   LOS: 18 days   Adrin Julian  [facility]   *Please refer to [email or web address], password [email or web address] to get updated schedule on who will round on this patient, as hospitalists switch teams weekly. If 7PM-7AM, please contact night-coverage at [email or web address], password [email or web address] for any overnight needs.  05/11/2019, 10:54 AM

## 2019-05-11 NOTE — Telephone Encounter (Signed)
Case closed-no further involvement 

## 2019-05-11 NOTE — Progress Notes (Signed)
Nutrition Brief Follow-Up Note  Chart reviewed. Patient has transitioned to comfort care.  No further nutrition interventions warranted at this time. Please re-consult RD as needed.  Anyae Griffith King, MS, RD, LDN Office: [phone] Pager: [phone] After Hours/Weekend Pager: [phone] 

## 2019-05-12 ENCOUNTER — Ambulatory Visit: Payer: Medicaid Other | Admitting: Family

## 2019-05-12 DIAGNOSIS — E870 Hyperosmolality and hypernatremia: Secondary | ICD-10-CM

## 2019-05-12 MED ORDER — OXYCODONE HCL 5 MG PO TABS: 5.0000 mg | ORAL_TABLET | ORAL | 0 refills | Status: AC | PRN

## 2019-05-12 MED ORDER — ACETAMINOPHEN 325 MG PO TABS: 650.0000 mg | ORAL_TABLET | Freq: Four times a day (QID) | ORAL | Status: DC | PRN

## 2019-05-12 MED ORDER — OXYCODONE HCL ER 15 MG PO T12A: 15.0000 mg | EXTENDED_RELEASE_TABLET | Freq: Two times a day (BID) | ORAL | 0 refills | Status: AC

## 2019-05-12 NOTE — TOC Progression Note (Signed)
Transition of Care [facility]) - Progression Note    Patient Details  Name: Joshua Petersen MRN: [id number] Date of Birth: 23-Apr-1956  Transition [facility]) CM/SW Contact  Su Hilt, RN Phone Number: 05/12/2019, 10:07 AM  Clinical Narrative:    Claiborne Billings with [facility] has made a bed offer, I accepted the bed offer and attempted a call to the son [name] anticipate DC today, unable to reach the son and unable to leave a VM as the Mailbox is full will try again later   Expected Discharge Plan: [place] Barriers to Discharge: Continued Medical Work up  Expected Discharge Plan and Services Expected Discharge Plan: [place]       Living arrangements for the past 2 months: Single Family Home                                       Social Determinants of Health (SDOH) Interventions    Readmission Risk Interventions Readmission Risk Prevention Plan 04/24/2019  Medication Review (RN Care Manager) Complete  Palliative Care Screening Not Applicable  Some recent data might be hidden

## 2019-05-12 NOTE — Discharge Summary (Addendum)
Physician Discharge Summary  Joshua Petersen [id number] DOB: 01-07-1956 DOA: 04/23/2019  PCP: Dione Housekeeperlmedo, Joshua Ernesto, MD  Admit date: 04/23/2019 Discharge date: 05/12/2019  Discharge disposition: Skilled nursing facility    Recommendations for Outpatient Follow-Up:   Follow-up with palliative care at discharge    Discharge Diagnosis:   Principal Problem:   Drug overdose Active Problems:   Alcohol withdrawal syndrome with complication, with unspecified complication (HCC)   Acute respiratory failure with hypoxia and hypercapnia (HCC)   AKI (acute kidney injury) (HCC)   Acute systolic CHF (congestive heart failure) (HCC)   Essential hypertension   Opioid abuse (HCC)   Acute metabolic encephalopathy   Alcoholic cardiomyopathy (HCC)   Hypernatremia   Alcohol abuse   Generalized weakness    Discharge Condition: Stable.  Diet recommendation: Regular diet  Code status: DNR   Hospital Course:    [name] is a 63 year old man with medical history significant for morbid obesity, chronic back pain, BPH, hypertension, alcohol use disorder, opioid use disorder, who presented to the hospital complains of generalized body aches.  In the emergency room, he was found to have altered mental status/lethargy and apparently, he was slumped over in his chair.  He was given IV bolus of Narcan and subsequently transitioned to IV Narcan infusion in the emergency room.  He was admitted to stepdown unit for further management.  He was also found to have acute kidney injury, acute urinary retention and hyponatremia.  He was treated with IV fluids and AKI resolved.  Hyponatremia resolved but he developed hypernatremia which improved eventually.  He required Foley catheter for BPH and urinary retention.  He was weaned off of IV Narcan infusion but he developed alcohol withdrawal syndrome and he had to be treated with IV Precedex drip and placed on Ativan per CIWA protocol.  He developed acute  hypoxemic respiratory failure and acute pulmonary edema secondary to acute systolic heart failure.  2D echo showed EF estimated at 25 to 30%.  He required IV Lasix for treatment of acute CHF.  Was ruled out with VQ scan with was reported as low probability for PE.  He was also seen in consultation by the cardiologist.  Cardiomyopathy was felt to be nonischemic and likely related to chronic alcohol use.  Patient remained encephalopathic/confused for a long time.  He was seen in consultation by the neurologist.  CT head and MRI brain did not show any evidence of acute stroke.  Given lack of clinical improvement, palliative care team was consulted and patient's son opted for comfort measures with hospice.  Discharge plan was discussed with Ivin BootyJoshua, his son, and he agreed with the plan.  All his questions were answered.       Discharge Exam:   Vitals:   05/11/19 0808 05/12/19 0831  BP: 132/74 133/67  Pulse: 77 85  Resp: 17   Temp: 98.4 F (36.9 C) 98 F (36.7 C)  SpO2: 95% 100%   Vitals:   05/10/19 0755 05/11/19 0553 05/11/19 0808 05/12/19 0831  BP: (!) 145/86 (!) 144/122 132/74 133/67  Pulse: 70 85 77 85  Resp: 18 16 17    Temp: 98.6 F (37 C) 98.7 F (37.1 C) 98.4 F (36.9 C) 98 F (36.7 C)  TempSrc:  Oral  Axillary  SpO2: 97% 95% 95% 100%  Weight:      Height:         GEN: NAD SKIN: No rash EYES: EOMI ENT: MMM CV: RRR PULM: CTA B ABD:  soft, protuberant, NT, +BS CNS: AAO x 1 (person), non focal EXT: No edema or tenderness   The results of significant diagnostics from this hospitalization (including imaging, microbiology, ancillary and laboratory) are listed below for reference.     Procedures and Diagnostic Studies:   US RENAL  Result Date: 04/27/2019 CLINICAL DATA:  Acute kidney injury. EXAM: RENAL / URINARY TRACT ULTRASOUND COMPLETE COMPARISON:  CT abdomen and pelvis 07/04/2016. FINDINGS: Right Kidney: Renal measurements: 14.7 x 6.0 x 5.8 cm = volume: 266.0 mL .  Echogenicity within normal limits. No mass or hydronephrosis visualized. Left Kidney: Renal measurements: 13.2 x 6.9 x 6.1 cm = volume: 289.2 mL. Echogenicity within normal limits. No worrisome mass or hydronephrosis visualized. An echogenic focus in the cortex of the midpole measuring 1 cm craniocaudal could be a nonshadowing stone or small angiomyolipoma. Bladder: Decompressed with a Foley catheter in place. Other: None. IMPRESSION: Negative for hydronephrosis. No acute finding or abnormality to explain acute kidney injury. Electronically Signed   By: Drusilla Kanner M.D.   On: 04/27/2019 11:33   ECHOCARDIOGRAM COMPLETE  Result Date: 04/27/2019   ECHOCARDIOGRAM REPORT   Patient Name:   Joshua Petersen Date of Exam: 04/27/2019 Medical Rec #:  [id number]    Height:       68.0 in Accession #:    [id number]   Weight:       278.7 lb Date of Birth:  [birth year]    BSA:          2.35 m Patient Age:    63 years     BP:           154/110 mmHg Patient Gender: M            HR:           74 bpm. Exam Location:  [facility] Procedure: 2D Echo, Cardiac Doppler, Color Doppler and Intracardiac            Opacification Agent Indications:     I50.9 Congestive Heart Failure  History:         Patient has no prior history of Echocardiogram examinations.                  Risk Factors:Hypertension and HCL. Hepatitis.  Sonographer:     Humphrey Rolls RDCS (AE) Referring Phys:  [id number] Lurene Shadow Diagnosing Phys: Lorine Bears MD  Sonographer Comments: Technically difficult study due to poor echo windows. Image acquisition challenging due to uncooperative patient and Image acquisition challenging due to respiratory motion. IMPRESSIONS  1. Very suboptiaml study even with contrast use. Left ventricular ejection fraction, by visual estimation, is 25 to 30%. The left ventricle has severely decreased function. There is no left ventricular hypertrophy.  2. Definity contrast agent was given IV to delineate the left ventricular endocardial borders.  3.  Left ventricular diastolic parameters are indeterminate.  4. Mildly dilated left ventricular internal cavity size.  5. Global right ventricle has normal systolic function.The right ventricular size is normal. No increase in right ventricular wall thickness.  6. Left atrial size was mildly dilated.  7. Right atrial size was normal.  8. The mitral valve is normal in structure. No evidence of mitral valve regurgitation. No evidence of mitral stenosis.  9. The tricuspid valve is normal in structure. Tricuspid valve regurgitation is not demonstrated. 10. The aortic valve is normal in structure. Aortic valve regurgitation is not visualized. Mild aortic valve sclerosis without stenosis. 11. The pulmonic valve was normal in structure.  Pulmonic valve regurgitation is not visualized. 12. TR signal is inadequate for assessing pulmonary artery systolic pressure. 13. The inferior vena cava is normal in size with greater than 50% respiratory variability, suggesting right atrial pressure of 3 mmHg. FINDINGS  Left Ventricle: Left ventricular ejection fraction, by visual estimation, is 25 to 30%. The left ventricle has severely decreased function. Definity contrast agent was given IV to delineate the left ventricular endocardial borders. The left ventricle is  not well visualized. The left ventricular internal cavity size was mildly dilated left ventricle. There is no left ventricular hypertrophy. Left ventricular diastolic parameters are indeterminate. Normal left atrial pressure. Right Ventricle: The right ventricular size is normal. No increase in right ventricular wall thickness. Global RV systolic function is has normal systolic function. Left Atrium: Left atrial size was mildly dilated. Right Atrium: Right atrial size was normal in size Pericardium: There is no evidence of pericardial effusion. Mitral Valve: The mitral valve is normal in structure. No evidence of mitral valve regurgitation. No evidence of mitral valve stenosis  by observation. MV peak gradient, 2.7 mmHg. Tricuspid Valve: The tricuspid valve is normal in structure. Tricuspid valve regurgitation is not demonstrated. Aortic Valve: The aortic valve is normal in structure. Aortic valve regurgitation is not visualized. Mild aortic valve sclerosis is present, with no evidence of aortic valve stenosis. Aortic valve mean gradient measures 1.0 mmHg. Aortic valve peak gradient measures 2.3 mmHg. Aortic valve area, by VTI measures 2.72 cm. Pulmonic Valve: The pulmonic valve was normal in structure. Pulmonic valve regurgitation is not visualized. Pulmonic regurgitation is not visualized. Aorta: The aortic root, ascending aorta and aortic arch are all structurally normal, with no evidence of dilitation or obstruction. Venous: The inferior vena cava is normal in size with greater than 50% respiratory variability, suggesting right atrial pressure of 3 mmHg. IAS/Shunts: No atrial level shunt detected by color flow Doppler. There is no evidence of a patent foramen ovale. No ventricular septal defect is seen or detected. There is no evidence of an atrial septal defect.  LEFT VENTRICLE PLAX 2D LVIDd:         5.88 cm       Diastology LVIDs:         5.52 cm       LV e' medial:   4.03 cm/s LV PW:         0.98 cm       LV E/e' medial: 15.4 LV IVS:        0.72 cm LVOT diam:     2.30 cm LV SV:         23 ml LV SV Index:   9.19 LVOT Area:     4.15 cm  LV Volumes (MOD) LV area d, A4C:    37.30 cm LV area s, A4C:    25.50 cm LV major d, A4C:   8.95 cm LV major s, A4C:   8.45 cm LV vol d, MOD A4C: 127.0 ml LV vol s, MOD A4C: 66.0 ml LV SV MOD A4C:     127.0 ml LEFT ATRIUM             Index LA diam:        3.40 cm 1.44 cm/m LA Vol (A2C):   59.1 ml 25.12 ml/m LA Vol (A4C):   59.8 ml 25.41 ml/m LA Biplane Vol: 59.1 ml 25.12 ml/m  AORTIC VALVE  PULMONIC VALVE AV Area (Vmax):    3.04 cm    PV Vmax:       0.68 m/s AV Area (Vmean):   2.73 cm    PV Vmean:      45.100 cm/s AV Area  (VTI):     2.72 cm    PV VTI:        0.090 m AV Vmax:           75.80 cm/s  PV Peak grad:  1.9 mmHg AV Vmean:          53.500 cm/s PV Mean grad:  1.0 mmHg AV VTI:            0.122 m AV Peak Grad:      2.3 mmHg AV Mean Grad:      1.0 mmHg LVOT Vmax:         55.40 cm/s LVOT Vmean:        35.200 cm/s LVOT VTI:          0.080 m LVOT/AV VTI ratio: 0.65  AORTA Ao Root diam: 3.80 cm MITRAL VALVE MV Area (PHT): 3.92 cm             SHUNTS MV Peak grad:  2.7 mmHg             Systemic VTI:  0.08 m MV Mean grad:  1.0 mmHg             Systemic Diam: 2.30 cm MV Vmax:       0.82 m/s MV Vmean:      45.1 cm/s MV VTI:        0.19 m MV PHT:        56.11 msec MV Decel Time: 194 msec MV E velocity: 62.10 cm/s 103 cm/s MV A velocity: 33.00 cm/s 70.3 cm/s MV E/A ratio:  1.88       1.5  Lorine Bears MD Electronically signed by Lorine Bears MD Signature Date/Time: 04/27/2019/1:41:16 PM    Final      Labs:   Basic Metabolic Panel: No results for input(s): NA, K, CL, CO2, GLUCOSE, BUN, CREATININE, CALCIUM, MG, PHOS in the last 168 hours. GFR Estimated Creatinine Clearance: 87.9 mL/min (by C-G formula based on SCr of 1.1 mg/dL). Liver Function Tests: No results for input(s): AST, ALT, ALKPHOS, BILITOT, PROT, ALBUMIN in the last 168 hours. No results for input(s): LIPASE, AMYLASE in the last 168 hours. No results for input(s): AMMONIA in the last 168 hours. Coagulation profile No results for input(s): INR, PROTIME in the last 168 hours.  CBC: No results for input(s): WBC, NEUTROABS, HGB, HCT, MCV, PLT in the last 168 hours. Cardiac Enzymes: No results for input(s): CKTOTAL, CKMB, CKMBINDEX, TROPONINI in the last 168 hours. BNP: Invalid input(s): POCBNP CBG: No results for input(s): GLUCAP in the last 168 hours. D-Dimer No results for input(s): DDIMER in the last 72 hours. Hgb A1c No results for input(s): HGBA1C in the last 72 hours. Lipid Profile No results for input(s): CHOL, HDL, LDLCALC, TRIG, CHOLHDL,  LDLDIRECT in the last 72 hours. Thyroid function studies No results for input(s): TSH, T4TOTAL, T3FREE, THYROIDAB in the last 72 hours.  Invalid input(s): FREET3 Anemia work up No results for input(s): VITAMINB12, FOLATE, FERRITIN, TIBC, IRON, RETICCTPCT in the last 72 hours. Microbiology Recent Results (from the past 240 hour(s))  SARS CORONAVIRUS 2 (TAT 6-24 HRS) Nasopharyngeal Nasopharyngeal Swab     Status: None   Collection Time: 05/11/19 11:24 AM   Specimen:  Nasopharyngeal Swab  Result Value Ref Range Status   SARS Coronavirus 2 NEGATIVE NEGATIVE Final    Comment: (NOTE) SARS-CoV-2 target nucleic acids are NOT DETECTED. The SARS-CoV-2 RNA is generally detectable in upper and lower respiratory specimens during the acute phase of infection. Negative results do not preclude SARS-CoV-2 infection, do not rule out co-infections with other pathogens, and should not be used as the sole basis for treatment or other patient management decisions. Negative results must be combined with clinical observations, patient history, and epidemiological information. The expected result is Negative. Fact Sheet for Patients: SugarRoll.be Fact Sheet for Healthcare Providers: https://www.woods-mathews.com/ This test is not yet approved or cleared by the [place] FDA and  has been authorized for detection and/or diagnosis of SARS-CoV-2 by FDA under an Emergency Use Authorization (EUA). This EUA will remain  in effect (meaning this test can be used) for the duration of the COVID-19 declaration under Section 56 4(b)(1) of the Act, 21 U.S.C. section 360bbb-3(b)(1), unless the authorization is terminated or revoked sooner. Performed at [facility], [place] [address], [place], [place] [address]      Discharge Instructions:   Discharge Instructions    Diet general   Complete by: As directed    Discharge instructions   Complete by: As directed     Follow-up with palliative care team at the nursing home   Increase activity slowly   Complete by: As directed      Allergies as of 05/12/2019   No Known Allergies     Medication List    STOP taking these medications   atorvastatin 40 MG tablet Commonly known as: LIPITOR   buprenorphine 2 MG Subl SL tablet Commonly known as: SUBUTEX   cloNIDine 0.1 MG tablet Commonly known as: CATAPRES   finasteride 5 MG tablet Commonly known as: PROSCAR   gabapentin 600 MG tablet Commonly known as: NEURONTIN   lisinopril-hydrochlorothiazide 20-12.5 MG tablet Commonly known as: ZESTORETIC   ondansetron 4 MG tablet Commonly known as: ZOFRAN   oxycodone 30 MG immediate release tablet Commonly known as: ROXICODONE Replaced by: oxyCODONE 15 mg 12 hr tablet   pantoprazole 40 MG tablet Commonly known as: PROTONIX   QUEtiapine 50 MG tablet Commonly known as: SEROQUEL   tamsulosin 0.4 MG Caps capsule Commonly known as: FLOMAX     TAKE these medications   acetaminophen 325 MG tablet Commonly known as: TYLENOL Take 2 tablets (650 mg total) by mouth every 6 (six) hours as needed for mild pain (or Fever >/= 101).   oxyCODONE 15 mg 12 hr tablet Commonly known as: OXYCONTIN Take 1 tablet (15 mg total) by mouth every 12 (twelve) hours for 5 days. Replaces: oxycodone 30 MG immediate release tablet   oxyCODONE 5 MG immediate release tablet Commonly known as: Oxy IR/ROXICODONE Take 1 tablet (5 mg total) by mouth every 4 (four) hours as needed for up to 5 days for moderate pain.       Contact information for follow-up providers    Wellington Hampshire, MD. Go on 05/12/2019.   Specialty: Cardiology Why: @10 :30 AM  Contact information: [place] [address] [phone]            Contact information for after-discharge care    [facility] .   Service: Skilled Chiropodist information: [address] [place] [employer] [phone]  Time coordinating discharge: 34 minutes  Signed:  Flara Storti  [facility] 05/12/2019, 12:16 PM

## 2019-05-12 NOTE — TOC Transition Note (Signed)
Transition of Care [facility]) - CM/SW Discharge Note   Patient Details  Name: Joshua Petersen MRN: [id number] Date of Birth: Aug 28, 1955  Transition of Care [facility]) CM/SW Contact:  Su Hilt, RN Phone Number: 05/12/2019, 11:56 AM   Clinical Narrative:     Patient to discharge to [facility] for [place] The patient will be transported by EMS The son [name] has been made aware and agrees, The bedside nurse to call report the the facility and to call EMS to transport when ready, The DC packet is on the chart  Final next level of care: [place] Barriers to Discharge: Barriers Resolved   Patient Goals and CMS Choice        Discharge Placement              Patient chooses bed at: [facility] Patient to be transferred to facility by: EMS Name of family member notified: [name] Patient and family notified of of transfer: 05/12/19  Discharge Plan and Services                                     Social Determinants of Health (SDOH) Interventions     Readmission Risk Interventions Readmission Risk Prevention Plan 04/24/2019  Medication Review (RN Care Manager) Complete  Palliative Care Screening Not Applicable  Some recent data might be hidden

## 2019-05-12 NOTE — Progress Notes (Signed)
Daily Progress Note   Patient Name: Joshua Petersen       Date: 05/12/2019 DOB: July 10, 1955  Age: 63 y.o. MRN#: [id number] Attending Physician: Jennye Boroughs, MD Primary Care Physician: [name], MD Admit Date: 04/23/2019  Reason for Consultation/Follow-up: Establishing goals of care  Subjective: Patient is resting in bed. He is able to state his name, DOB, that he is at the hospital in [place], the year is 2020, and president is Trump. In conversation discussing his status and the future, it is questionable if he understands cause and effect. Question his decision making capacity.   He states his pain is controlled on his current regimen.  Upon attempting to discuss [place], he states he wants God to be in control of his life, and what happens to him, and does not want to make any decisions regarding his care moving forward. Discussed his diagnoses and prognoses, and current plan of care. He states "I want to be here for that little girl." [place]. He is amenable to go to SNF to see how things go and either continue palliative or transition to hospice. He confirms DNR/DNI.   Spoke with son [name].  Discussed conversation with his father. He understands plans for D/C with palliative to SNF and transition to hospice if he chooses.   Length of Stay: 19  Current Medications: Scheduled Meds:  . feeding supplement (ENSURE ENLIVE)  237 mL Oral TID BM  . mouth rinse  15 mL Mouth Rinse BID  . oxyCODONE  15 mg Oral Q12H  . pantoprazole  40 mg Oral Daily  . polyethylene glycol  17 g Oral Daily    Continuous Infusions:   PRN Meds: acetaminophen **OR** acetaminophen, albuterol, antiseptic oral rinse, bisacodyl, glycopyrrolate **OR**  glycopyrrolate **OR** glycopyrrolate, haloperidol **OR** haloperidol **OR** haloperidol lactate, LORazepam **OR** LORazepam **OR** LORazepam, ondansetron **OR** ondansetron (ZOFRAN) IV, oxyCODONE **OR** oxyCODONE, polyvinyl alcohol  Physical Exam Pulmonary:     Effort: Pulmonary effort is normal.  Neurological:     Mental Status: He is alert.     Comments: Question higher level cognition.              Vital Signs: BP 133/67 (BP Location: Right Arm)   Pulse 85   Temp 98 F (36.7  C) (Axillary)   Resp 17   Ht 5\' 8"  (1.727 m)   Wt 126.4 kg   SpO2 100%   BMI 42.37 kg/m  SpO2: SpO2: 100 % O2 Device: O2 Device: Room Air O2 Flow Rate: O2 Flow Rate (L/min): 2 L/min  Intake/output summary:   Intake/Output Summary (Last 24 hours) at 05/12/2019 1044 Last data filed at 05/12/2019 0900 Gross per 24 hour  Intake 240 ml  Output 1400 ml  Net -1160 ml   LBM: Last BM Date: 05/10/19 Baseline Weight: Weight: 122.5 kg Most recent weight: Weight: 126.4 kg       Palliative Assessment/Data:30%      Patient Active Problem List   Diagnosis Date Noted  . Generalized weakness   . Alcohol abuse 05/02/2019  . Alcoholic cardiomyopathy (HCC)   . Hypernatremia   . Opioid abuse (HCC)   . Acute metabolic encephalopathy   . Essential hypertension   . Acute systolic CHF (congestive heart failure) (HCC) 04/28/2019  . Alcohol withdrawal syndrome with complication, with unspecified complication (HCC) 04/24/2019  . Acute respiratory failure with hypoxia and hypercapnia (HCC) 04/24/2019  . AKI (acute kidney injury) (HCC) 04/24/2019  . Drug overdose 04/23/2019  . Status post total knee replacement using cement, right 12/11/2018  . Chronic pain of right knee 06/05/2018  . 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 Primary osteoarthritis of right knee 05/08/2018  . 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 Morbid obesity with BMI of 40.0-44.9, adult (HCC)  12/28/2015    Palliative Care Assessment & Plan    Recommendations/Plan: Could have psychiatry assessment to assess decision making capacity.  Plans for D/C to SNF with palliative and transition to hospice if desired based on heart failure.    Code Status:    Code Status Orders  (From admission, onward)         Start     Ordered   04/23/19 0701  Full code  Continuous     04/23/19 0702        Code Status History    Date Active Date Inactive Code Status Order ID Comments User Context   12/11/2018 1131 12/12/2018 1530 Full Code 12/14/2018  [id number], MD Inpatient   03/12/2017 1246 03/15/2017 1756 Full Code 13/06/2016  Poggi, [id number], MD Inpatient   Advance Care Planning Activity       Prognosis:   Poor overall. Appetite appears to be improving. Acute metabolic encephalopathy improved, AKI, systolic heart failure with EF of 25-30%. ETOH abuse.     Care plan was discussed with RN  Thank you for allowing the Palliative Medicine Team to assist in the care of this patient.   Total Time 35 min Prolonged Time Billed no      Greater than 50%  of this time was spent counseling and coordinating care related to the above assessment and plan.  Excell Seltzer, NP  Please contact Palliative Medicine Team phone at [phone] for questions and concerns.

## 2019-05-12 NOTE — Progress Notes (Signed)
[facility] and gave report to Romilda Joy RN  Patient is going to Rm  8B

## 2019-05-12 NOTE — TOC Progression Note (Signed)
Transition of Care [facility]) - Progression Note    Patient Details  Name: DVANTE HANDS MRN: [id number] Date of Birth: [birth year]  [facility] CM/SW Contact  Su Hilt, RN Phone Number: 05/12/2019, 10:22 AM  Clinical Narrative:    Spoke with the Lesleigh Noe and he agreed to accept the bed offer for long term, I provided the information for [facility] and the son will call them to fill out the appropriate paper work   Expected Discharge Plan: [place] Barriers to Discharge: Continued Medical Work up  Expected Discharge Plan and Services Expected Discharge Plan: [place]       Living arrangements for the past 2 months: Single Family Home                                       Social Determinants of Health (SDOH) Interventions    Readmission Risk Interventions Readmission Risk Prevention Plan 04/24/2019  Medication Review (RN Care Manager) Complete  Palliative Care Screening Not Applicable  Some recent data might be hidden

## 2019-05-12 NOTE — Telephone Encounter (Signed)
Patient currently admitted at this time. Appears patient may be discharged today. TCM appointment was supposed to be today but cancelled due to still being hospitalized.  Will reassess tomorrow for possible TCM.

## 2019-05-12 NOTE — Progress Notes (Signed)
New referral for [facility] Palliative program to follow at [facility] received from [facility]. Patient information given to referral. Plan is for discharge today. Flo Shanks BSN, RN, [facility] [phone]

## 2019-05-13 NOTE — Telephone Encounter (Signed)
Pt dc'd to [facility] on 05/12/19.  Pt does not meet TOC due to being in a facility.  Pt needs a follow up appointment scheduled.  Routing to scheduling to arrange follow-up.

## 2019-05-13 NOTE — Telephone Encounter (Signed)
Attempted to schedule. Lm for scheduler at [facility] .  [phone].

## 2019-05-21 NOTE — Telephone Encounter (Signed)
No ans no vm   °

## 2019-05-29 ENCOUNTER — Non-Acute Institutional Stay: Payer: Medicaid Other | Admitting: Nurse Practitioner

## 2019-05-29 ENCOUNTER — Other Ambulatory Visit: Payer: Self-pay

## 2019-05-29 ENCOUNTER — Encounter: Payer: Self-pay | Admitting: Nurse Practitioner

## 2019-05-29 VITALS — BP 159/89 | HR 75 | Temp 98.0°F | Resp 20 | Wt 275.5 lb

## 2019-05-29 DIAGNOSIS — I426 Alcoholic cardiomyopathy: Secondary | ICD-10-CM

## 2019-05-29 DIAGNOSIS — Z515 Encounter for palliative care: Secondary | ICD-10-CM

## 2019-05-29 NOTE — Telephone Encounter (Signed)
Line busy
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
## 2019-06-15 ENCOUNTER — Ambulatory Visit (INDEPENDENT_AMBULATORY_CARE_PROVIDER_SITE_OTHER): Payer: Medicaid Other | Admitting: Physician Assistant

## 2019-06-15 ENCOUNTER — Other Ambulatory Visit: Payer: Self-pay

## 2019-06-15 ENCOUNTER — Encounter: Payer: Self-pay | Admitting: Physician Assistant

## 2019-06-15 VITALS — BP 140/70 | HR 95 | Ht 68.0 in | Wt 275.0 lb

## 2019-06-15 DIAGNOSIS — I5043 Acute on chronic combined systolic (congestive) and diastolic (congestive) heart failure: Secondary | ICD-10-CM

## 2019-06-15 DIAGNOSIS — I10 Essential (primary) hypertension: Secondary | ICD-10-CM

## 2019-06-15 DIAGNOSIS — R627 Adult failure to thrive: Secondary | ICD-10-CM

## 2019-06-15 DIAGNOSIS — I428 Other cardiomyopathies: Secondary | ICD-10-CM | POA: Diagnosis not present

## 2019-06-15 DIAGNOSIS — I5022 Chronic systolic (congestive) heart failure: Secondary | ICD-10-CM | POA: Diagnosis not present

## 2019-06-15 DIAGNOSIS — E782 Mixed hyperlipidemia: Secondary | ICD-10-CM

## 2019-06-15 DIAGNOSIS — G894 Chronic pain syndrome: Secondary | ICD-10-CM

## 2019-06-15 MED ORDER — ATORVASTATIN CALCIUM 40 MG PO TABS: 40.0000 mg | ORAL_TABLET | Freq: Every day | ORAL | 3 refills | Status: DC

## 2019-06-15 MED ORDER — CARVEDILOL 3.125 MG PO TABS: 3.1250 mg | ORAL_TABLET | Freq: Two times a day (BID) | ORAL | 3 refills | Status: DC

## 2019-06-15 NOTE — Patient Instructions (Addendum)
Medication Instructions:  1- RESTART Coreg Take 1 tablet (3.125 mg total) by mouth 2 (two) times daily with a meal 2- RESTART Lipitor Take 1 tablet (40 mg total) by mouth daily *If you need a refill on your cardiac medications before your next appointment, please call your pharmacy*  Lab Work: Your physician recommends that you return for lab work in: 8 weeks at the medical mall (around 4-1). You will need to be fasting. (Lipids, LFTs) No appt is needed. Hours are M-F 7AM- 6 PM.  If you have labs (blood work) drawn today and your tests are completely normal, you will receive your results only by: Marland Kitchen MyChart Message (if you have MyChart) OR . A paper copy in the mail If you have any lab test that is abnormal or we need to change your treatment, we will call you to review the results.  Testing/Procedures: None ordered   Follow-Up: At [facility], you and your health needs are our priority.  As part of our continuing mission to provide you with exceptional heart care, we have created designated Provider Care Teams.  These Care Teams include your primary Cardiologist (physician) and Advanced Practice Providers (APPs -  Physician Assistants and Nurse Practitioners) who all work together to provide you with the care you need, when you need it.  Your next appointment:   1 month(s)  The format for your next appointment:   In Person  Provider:    You may see Joshua Bears, MD or Joshua Listen, PA-C.

## 2019-06-21 ENCOUNTER — Telehealth: Payer: Self-pay | Admitting: Nurse Practitioner

## 2019-06-21 NOTE — Telephone Encounter (Signed)
   Pt called this AM b/c he was seen on 2/1 and started on coreg 3.125mg  bid and lipitor 40 daily.  He is staying @ [facility] and says that he has not seen any change in the medications that they're providing for him, since this change.  I do see that these Rx were sent to his local pharmacy in [place], and thus would have been on his AVS.  I advised that I will forward this message to our office team, so that they can look into what might need to occur to ensure that [facility] updates his medications as recently Rx.  Caller verbalized understanding and was grateful for the call back.  Nicolasa Ducking, NP 06/21/2019, 9:47 AM

## 2019-06-22 NOTE — Telephone Encounter (Signed)
Made call to [facility] to confirm they had up to date Rx from last OV. RN, Elnita Maxwell confirmed they have been giving medication as ordered at last ov.   Attempted to call patient. No answer, no vm.   Missed call from patient.   Call back to patient and made him aware of information provided by RN Elnita Maxwell. Pt reports that he is not getting them as ordered. I suggested that he reach out to the nursing supervisor at [facility] to discuss further.   Pt appreciative of call and has no further questions at this time.

## 2019-06-22 NOTE — Telephone Encounter (Signed)
Patient stated he received a call from our office this morning. I was unable to find documentation that showed a call from today but patient will be able to talk if needed
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
## 2019-07-14 ENCOUNTER — Ambulatory Visit (INDEPENDENT_AMBULATORY_CARE_PROVIDER_SITE_OTHER): Payer: Medicaid Other | Admitting: Physician Assistant

## 2019-07-14 ENCOUNTER — Encounter: Payer: Self-pay | Admitting: Physician Assistant

## 2019-07-14 ENCOUNTER — Other Ambulatory Visit: Payer: Self-pay

## 2019-07-14 VITALS — BP 134/76 | HR 87 | Ht 68.0 in | Wt 258.8 lb

## 2019-07-14 DIAGNOSIS — I5020 Unspecified systolic (congestive) heart failure: Secondary | ICD-10-CM

## 2019-07-14 DIAGNOSIS — Z79899 Other long term (current) drug therapy: Secondary | ICD-10-CM

## 2019-07-14 DIAGNOSIS — G894 Chronic pain syndrome: Secondary | ICD-10-CM

## 2019-07-14 DIAGNOSIS — I10 Essential (primary) hypertension: Secondary | ICD-10-CM | POA: Diagnosis not present

## 2019-07-14 DIAGNOSIS — I428 Other cardiomyopathies: Secondary | ICD-10-CM | POA: Diagnosis not present

## 2019-07-14 DIAGNOSIS — E782 Mixed hyperlipidemia: Secondary | ICD-10-CM

## 2019-07-14 MED ORDER — LOSARTAN POTASSIUM 25 MG PO TABS: 12.5000 mg | ORAL_TABLET | Freq: Every day | ORAL | 3 refills | Status: DC

## 2019-07-14 NOTE — Patient Instructions (Signed)
Medication Instructions:  Your physician has recommended you make the following change in your medication:   START: Losartan 12.5 mg take one tablet daily.  If you need a refill on your cardiac medications before your next appointment, please call your pharmacy*   Lab Work: Your physician recommends that you return for lab work in:  1)Today (BMET) 2.In one week (BMET). At the medical mall, no appointment needed.  If you have labs (blood work) drawn today and your tests are completely normal, you will receive your results only by: Marland Kitchen MyChart Message (if you have MyChart) OR . A paper copy in the mail If you have any lab test that is abnormal or we need to change your treatment, we will call you to review the results.   Testing/Procedures: None  Follow-Up: At [facility], you and your health needs are our priority.  As part of our continuing mission to provide you with exceptional heart care, we have created designated Provider Care Teams.  These Care Teams include your primary Cardiologist (physician) and Advanced Practice Providers (APPs -  Physician Assistants and Nurse Practitioners) who all work together to provide you with the care you need, when you need it.  We recommend signing up for the patient portal called "MyChart".  Sign up information is provided on this After Visit Summary.  MyChart is used to connect with patients for Virtual Visits (Telemedicine).  Patients are able to view lab/test results, encounter notes, upcoming appointments, etc.  Non-urgent messages can be sent to your provider as well.   To learn more about what you can do with MyChart, go to [email or web address].    Your next appointment:   2 week(s)  The format for your next appointment:   In Person  Provider:   Lorine Bears, MD   Other Instructions

## 2019-07-15 ENCOUNTER — Telehealth: Payer: Self-pay

## 2019-07-15 LAB — BASIC METABOLIC PANEL
BUN/Creatinine Ratio: 24 (ref 10–24)
BUN: 21 mg/dL (ref 8–27)
CO2: 28 mmol/L (ref 20–29)
Calcium: 10 mg/dL (ref 8.6–10.2)
Chloride: 102 mmol/L (ref 96–106)
Creatinine, Ser: 0.86 mg/dL (ref 0.76–1.27)
GFR calc Af Amer: 106 mL/min/{1.73_m2} (ref >59)
GFR calc non Af Amer: 92 mL/min/{1.73_m2} (ref >59)
Glucose: 118 mg/dL — ABNORMAL HIGH (ref 65–99)
Potassium: 4.7 mmol/L (ref 3.5–5.2)
Sodium: 142 mmol/L (ref 134–144)

## 2019-07-15 NOTE — Telephone Encounter (Signed)
Call to patient to discuss results.   No further questions or orders at this time.   Advised pt to call for any further questions or concerns.

## 2019-07-15 NOTE — Telephone Encounter (Signed)
-----   Message from Sondra Barges, PA-C sent at 07/15/2019  7:23 AM EST ----- Renal function and potassium are stable. Continue planned initiation of losartan with follow-up BMET in 1 week. Random glucose okay.

## 2019-07-20 ENCOUNTER — Telehealth: Payer: Self-pay | Admitting: Cardiovascular Disease

## 2019-07-20 NOTE — Telephone Encounter (Signed)
Spoke with the patient. Patient called to discuss his Oxycodone dosage listed on hie medication list. His AVS given to him on 07/14/19 had percocet 10-325 mg every 6 hours and he is only being given Oxycodone 5mg . Updated the patients medication list as reported by the pt.  Pt complains that his pain is not being managed on that dose. He is currently residing at [facility]. Advised the patient to discuss his concerns with the physician writing the prescription, or the doctors and nurses taking care of him at the facility.  Call lasting >10 min due to patient complaint of pain and needing more pain medication. Adv the patient that Cardiology will not manage his pain and I attempted to redirect him advising him again to talk with the providers there that are taking care of him. Patient then stated ok have a good day and d/c the call.

## 2019-07-20 NOTE — Telephone Encounter (Signed)
Pt c/o medication issue:  1. Name of Medication: oxycodone   2. How are you currently taking this medication (dosage and times per day)? Not sure   3. Are you having a reaction (difficulty breathing--STAT)? No   4. What is your medication issue?  Patient reports increased pain causing loss of sleep since starting to use cane to ambulate.  Patient wants to discuss last ov with provider and his medication instructions    Per request mailed AVS to patient as well

## 2019-07-21 ENCOUNTER — Telehealth: Payer: Self-pay | Admitting: Cardiovascular Disease
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
## 2019-07-21 NOTE — Telephone Encounter (Signed)
Spoke with the patient. Advised him that I have talked with Joshua Listen, PA and it is ok to repeat the patients labwork (bmet) when he is seen for his 07/27/19 appt.  Patient verbalized understanding and voiced appreciation for the call back.

## 2019-07-30 ENCOUNTER — Encounter: Payer: Self-pay | Admitting: Cardiovascular Disease

## 2019-07-30 ENCOUNTER — Other Ambulatory Visit: Payer: Self-pay

## 2019-07-30 ENCOUNTER — Telehealth: Payer: Self-pay

## 2019-07-30 ENCOUNTER — Ambulatory Visit (INDEPENDENT_AMBULATORY_CARE_PROVIDER_SITE_OTHER): Payer: Medicaid Other | Admitting: Cardiovascular Disease

## 2019-07-30 VITALS — BP 128/68 | HR 87 | Ht 68.0 in | Wt 259.0 lb

## 2019-07-30 DIAGNOSIS — E785 Hyperlipidemia, unspecified: Secondary | ICD-10-CM

## 2019-07-30 DIAGNOSIS — I10 Essential (primary) hypertension: Secondary | ICD-10-CM

## 2019-07-30 DIAGNOSIS — I5022 Chronic systolic (congestive) heart failure: Secondary | ICD-10-CM

## 2019-07-30 MED ORDER — LOSARTAN POTASSIUM 25 MG PO TABS: 25.0000 mg | ORAL_TABLET | Freq: Every day | ORAL | 5 refills | Status: DC

## 2019-07-30 NOTE — Telephone Encounter (Addendum)
[facility] [phone].  Called to communicate Dr. Jari Sportsman orders and changes made at the patients o/v today.  Spoke with Mimi the Museum/gallery curator. Verbal orders given. START Losartan 25 mg qd. Bmet in 1 week. The facility when fax the results to our office attn: Dr. Kirke Corin. F/u in 2 months.   No further action required.

## 2019-07-30 NOTE — Patient Instructions (Signed)
Medication Instructions:  Your physician has recommended you make the following change in your medication:   START Losartan 25 mg daily. An Rx has been sent to your pharmacy.  An Rx was previously sent for 12.5 mg daily  *If you need a refill on your cardiac medications before your next appointment, please call your pharmacy*   Lab Work: You  will need labwork (bmet) in 1 week. We will send the order to [facility]. If you have labs (blood work) drawn today and your tests are completely normal, you will receive your results only by: Marland Kitchen MyChart Message (if you have MyChart) OR . A paper copy in the mail If you have any lab test that is abnormal or we need to change your treatment, we will call you to review the results.   Testing/Procedures: None ordered   Follow-Up: At [facility], you and your health needs are our priority.  As part of our continuing mission to provide you with exceptional heart care, we have created designated Provider Care Teams.  These Care Teams include your primary Cardiologist (physician) and Advanced Practice Providers (APPs -  Physician Assistants and Nurse Practitioners) who all work together to provide you with the care you need, when you need it.  We recommend signing up for the patient portal called "MyChart".  Sign up information is provided on this After Visit Summary.  MyChart is used to connect with patients for Virtual Visits (Telemedicine).  Patients are able to view lab/test results, encounter notes, upcoming appointments, etc.  Non-urgent messages can be sent to your provider as well.   To learn more about what you can do with MyChart, go to [email or web address].    Your next appointment:   2 month(s)  The format for your next appointment:   In Person  Provider:    You may see Lorine Bears, MD or one of the following Advanced Practice Providers on your designated Care Team:    Nicolasa Ducking, NP  Eula Listen, PA-C  Marisue Ivan, PA-C    Other Instructions N/A
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
## 2019-08-05 ENCOUNTER — Telehealth: Payer: Self-pay | Admitting: Internal Medicine

## 2019-08-05 NOTE — Telephone Encounter (Signed)
Please call regarding his medication. He is not sure which medication, but it is the last one that was prescribed. States he has not gotten this rx as of yet. Please call to discuss.
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
## 2019-08-17 ENCOUNTER — Telehealth: Payer: Self-pay | Admitting: Internal Medicine

## 2019-08-17 NOTE — Telephone Encounter (Signed)
Patient requesting office call [facility] and speak with Loraine Leriche at facility to discuss medications.    [phone].

## 2019-08-17 NOTE — Telephone Encounter (Signed)
RN Loraine Leriche from [employer] requesting list faxed to facility with current medications.   Fax number [phone]

## 2019-08-18 NOTE — Telephone Encounter (Signed)
Spoke with patient and Loraine Leriche at [facility].  Patient said he was on two medications to help his enlarged Prostate with helped him not have to get up so often at night to urinate. He would like to get back on those again.  Then spoke with the nurse, and let him know the hospital discharge summary from 05/12/19 had tamsulosin and finasteride listed for patient to "stop taking" at discharge.  He asked me to fax this to him at [phone]. Summary faxed.  Advised they should reach out to PCP for order for prostate medication.

## 2019-08-18 NOTE — Telephone Encounter (Signed)
Patient calling back in stating we did not discuss the correct medication with [facility]. Patient states he needs the "medicatin that kept him from go to the bathroom" to be discussed but he does not know the name of it. Patient states he wakes up too much at night and wants to be put back on the medication that helped stop that.  Patient would like to be called first [phone]

## 2019-08-20 NOTE — Telephone Encounter (Signed)
Patient calling Would like to clarify the information given to [facility] on last call Please call to discuss

## 2019-08-21 NOTE — Telephone Encounter (Signed)
Patient calling back to state Loraine Leriche, nurse at [facility], they have not received the faxed prescription yet. Patient states Nurse is there today if calling would be easier, you can call the patients phone number. The fax number again in [phone].   Please advise when able

## 2019-08-21 NOTE — Telephone Encounter (Signed)
Spoke with patient and Joshua Petersen. Re-faxed the discharge summary to the number provided. They will let me know if they need anything else.

## 2019-09-15 ENCOUNTER — Other Ambulatory Visit: Payer: Self-pay | Admitting: Surgery

## 2019-09-15 DIAGNOSIS — M25512 Pain in left shoulder: Secondary | ICD-10-CM

## 2019-09-15 DIAGNOSIS — M7582 Other shoulder lesions, left shoulder: Secondary | ICD-10-CM

## 2019-09-15 DIAGNOSIS — M19012 Primary osteoarthritis, left shoulder: Secondary | ICD-10-CM

## 2019-09-24 ENCOUNTER — Ambulatory Visit: Payer: Medicaid Other

## 2019-10-01 ENCOUNTER — Other Ambulatory Visit: Payer: Self-pay

## 2019-10-01 ENCOUNTER — Encounter: Payer: Self-pay | Admitting: Cardiovascular Disease

## 2019-10-01 ENCOUNTER — Ambulatory Visit (INDEPENDENT_AMBULATORY_CARE_PROVIDER_SITE_OTHER): Payer: Medicaid Other | Admitting: Cardiovascular Disease

## 2019-10-01 VITALS — BP 132/68 | HR 94 | Ht 68.0 in | Wt 275.5 lb

## 2019-10-01 DIAGNOSIS — E785 Hyperlipidemia, unspecified: Secondary | ICD-10-CM

## 2019-10-01 DIAGNOSIS — I5022 Chronic systolic (congestive) heart failure: Secondary | ICD-10-CM

## 2019-10-01 DIAGNOSIS — I10 Essential (primary) hypertension: Secondary | ICD-10-CM
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
## 2019-10-01 NOTE — Patient Instructions (Signed)
Medication Instructions:  Your physician recommends that you continue on your current medications as directed. Please refer to the Current Medication list given to you today.  *If you need a refill on your cardiac medications before your next appointment, please call your pharmacy*   Lab Work: None ordered If you have labs (blood work) drawn today and your tests are completely normal, you will receive your results only by: Marland Kitchen MyChart Message (if you have MyChart) OR . A paper copy in the mail If you have any lab test that is abnormal or we need to change your treatment, we will call you to review the results.   Testing/Procedures: Your physician has requested that you have an echocardiogram. Echocardiography is a painless test that uses sound waves to create images of your heart. It provides your doctor with information about the size and shape of your heart and how well your heart's chambers and valves are working. This procedure takes approximately one hour. There are no restrictions for this procedure.     Follow-Up: At [facility], you and your health needs are our priority.  As part of our continuing mission to provide you with exceptional heart care, we have created designated Provider Care Teams.  These Care Teams include your primary Cardiologist (physician) and Advanced Practice Providers (APPs -  Physician Assistants and Nurse Practitioners) who all work together to provide you with the care you need, when you need it.  We recommend signing up for the patient portal called "MyChart".  Sign up information is provided on this After Visit Summary.  MyChart is used to connect with patients for Virtual Visits (Telemedicine).  Patients are able to view lab/test results, encounter notes, upcoming appointments, etc.  Non-urgent messages can be sent to your provider as well.   To learn more about what you can do with MyChart, go to [email or web address].    Your next appointment:   4  month(s)  The format for your next appointment:   In Person  Provider:    You may see Lorine Bears, MD or one of the following Advanced Practice Providers on your designated Care Team:    Nicolasa Ducking, NP  Eula Listen, PA-C  Marisue Ivan, PA-C    Other Instructions N/A

## 2019-10-05 ENCOUNTER — Other Ambulatory Visit: Payer: Self-pay

## 2019-10-05 ENCOUNTER — Ambulatory Visit
Admission: RE | Admit: 2019-10-05 | Discharge: 2019-10-05 | Disposition: A | Discharge status: Home / Self Care | Payer: Medicaid Other | Source: Ambulatory Visit | Attending: Surgery | Admitting: Surgery

## 2019-10-05 DIAGNOSIS — M7582 Other shoulder lesions, left shoulder: Secondary | ICD-10-CM | POA: Insufficient documentation

## 2019-10-05 DIAGNOSIS — M19012 Primary osteoarthritis, left shoulder: Secondary | ICD-10-CM | POA: Insufficient documentation

## 2019-10-05 DIAGNOSIS — M25512 Pain in left shoulder: Secondary | ICD-10-CM | POA: Diagnosis present

## 2019-11-04 ENCOUNTER — Non-Acute Institutional Stay: Payer: Medicare Other | Admitting: Nurse Practitioner

## 2019-11-04 ENCOUNTER — Encounter: Payer: Self-pay | Admitting: Nurse Practitioner

## 2019-11-04 ENCOUNTER — Other Ambulatory Visit: Payer: Self-pay

## 2019-11-04 VITALS — Wt 266.1 lb

## 2019-11-04 DIAGNOSIS — Z515 Encounter for palliative care: Secondary | ICD-10-CM

## 2019-11-04 DIAGNOSIS — I426 Alcoholic cardiomyopathy: Secondary | ICD-10-CM
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
## 2019-11-06 ENCOUNTER — Ambulatory Visit (INDEPENDENT_AMBULATORY_CARE_PROVIDER_SITE_OTHER): Payer: Medicare Other

## 2019-11-06 ENCOUNTER — Other Ambulatory Visit: Payer: Self-pay

## 2019-11-06 DIAGNOSIS — I5022 Chronic systolic (congestive) heart failure: Secondary | ICD-10-CM | POA: Diagnosis not present

## 2019-11-06 MED ORDER — PERFLUTREN LIPID MICROSPHERE
1.0000 mL | INTRAVENOUS | Status: AC | PRN
  Administered 2019-11-06: 2 mL via INTRAVENOUS

## 2019-12-01 ENCOUNTER — Other Ambulatory Visit: Payer: Self-pay | Admitting: Surgery

## 2019-12-03 ENCOUNTER — Other Ambulatory Visit: Admission: RE | Admit: 2019-12-03 | Payer: Medicare Other | Source: Ambulatory Visit

## 2019-12-04 ENCOUNTER — Telehealth: Payer: Self-pay

## 2019-12-04 NOTE — Telephone Encounter (Signed)
-----   Message from Karen Kitchens, NP sent at 12/04/2019 12:31 PM EDT ----- Regarding: Request for pre-operative cardiac clearance Request for pre-operative cardiac clearance:  1. What type of surgery is being performed? Reverse LEFT total shoulder arthroplasty  2. When is this surgery scheduled? 12/10/2019  3. Are there any medications that need to be held prior to surgery and how long? N/A  4. Practice name and name of physician performing surgery?  Performing surgeon: Dr. Milagros Evener, MD Requesting clearance: Honor Loh, FNP-C with [facility].    5. What is the office phone and fax number?  [phone] (phone)   [phone] (fax) ATTENTION: Once cleared, or if further information is required, please route message back to Honor Loh, FNP-C via [facility]. No need to FAX to surgeon or PAT office. Note to be reviewed by APP in [facility]. Unable to create telephone message as per your standard workflow. Directed by [facility] providers to send requests for cardiac clearance to this pool for appropriate distribution to provider covering pre-operative clearances.   6. Anesthesia type (none, local, MAC, general)? General   Honor Loh, MSN, APRN, FNP-C, CEN [facility]  Peri-operative Services Nurse Practitioner Phone: [phone] 12/04/19 12:31 PM

## 2019-12-04 NOTE — Telephone Encounter (Signed)
   Primary Cardiologist: Lorine Bears, MD  Chart reviewed as part of pre-operative protocol coverage. Given past medical history and time since last visit, based on ACC/AHA guidelines, BENN TARVER would be at acceptable risk for the planned procedure without further cardiovascular testing.   I will route this recommendation to the requesting party via Epic fax function and remove from pre-op pool.  Please call with questions.  Thomasene Ripple. Faiga Stones NP-C    12/04/2019, 1:24 PM [facility] [address] Office [phone] Fax [phone]

## 2019-12-08 ENCOUNTER — Encounter: Payer: Self-pay | Admitting: Urgent Care

## 2019-12-08 ENCOUNTER — Other Ambulatory Visit: Payer: Self-pay

## 2019-12-08 ENCOUNTER — Encounter
Admission: RE | Admit: 2019-12-08 | Discharge: 2019-12-08 | Disposition: A | Discharge status: Home / Self Care | Payer: Medicare Other | Source: Ambulatory Visit | Attending: Surgery | Admitting: Surgery

## 2019-12-08 ENCOUNTER — Inpatient Hospital Stay
Admission: RE | Admit: 2019-12-08 | Discharge: 2019-12-08 | Disposition: A | Discharge status: Home / Self Care | Payer: Medicare Other | Source: Ambulatory Visit

## 2019-12-08 DIAGNOSIS — Z20822 Contact with and (suspected) exposure to covid-19: Secondary | ICD-10-CM | POA: Insufficient documentation

## 2019-12-08 DIAGNOSIS — Z01812 Encounter for preprocedural laboratory examination: Secondary | ICD-10-CM | POA: Insufficient documentation

## 2019-12-08 LAB — COMPREHENSIVE METABOLIC PANEL
ALT: 15 U/L (ref 0–44)
AST: 14 U/L — ABNORMAL LOW (ref 15–41)
Albumin: 3.8 g/dL (ref 3.5–5.0)
Alkaline Phosphatase: 77 U/L (ref 38–126)
Anion gap: 9 (ref 5–15)
BUN: 18 mg/dL (ref 8–23)
CO2: 32 mmol/L (ref 22–32)
Calcium: 9.3 mg/dL (ref 8.9–10.3)
Chloride: 98 mmol/L (ref 98–111)
Creatinine, Ser: 0.9 mg/dL (ref 0.61–1.24)
GFR calc Af Amer: >60 mL/min (ref >60)
GFR calc non Af Amer: >60 mL/min (ref >60)
Glucose, Bld: 108 mg/dL — ABNORMAL HIGH (ref 70–99)
Potassium: 4.2 mmol/L (ref 3.5–5.1)
Sodium: 139 mmol/L (ref 135–145)
Total Bilirubin: 0.8 mg/dL (ref 0.3–1.2)
Total Protein: 7.5 g/dL (ref 6.5–8.1)

## 2019-12-08 LAB — URINALYSIS, ROUTINE W REFLEX MICROSCOPIC
Bilirubin Urine: NEGATIVE
Glucose, UA: NEGATIVE mg/dL
Hgb urine dipstick: NEGATIVE
Ketones, ur: NEGATIVE mg/dL
Leukocytes,Ua: NEGATIVE
Nitrite: NEGATIVE
Protein, ur: NEGATIVE mg/dL
Specific Gravity, Urine: 1.02 (ref 1.005–1.030)
pH: 6 (ref 5.0–8.0)

## 2019-12-08 LAB — CBC WITH DIFFERENTIAL/PLATELET
Abs Immature Granulocytes: 0.03 10*3/uL (ref 0.00–0.07)
Basophils Absolute: 0.1 10*3/uL (ref 0.0–0.1)
Basophils Relative: 1 %
Eosinophils Absolute: 0.3 10*3/uL (ref 0.0–0.5)
Eosinophils Relative: 3 %
HCT: 40.7 % (ref 39.0–52.0)
Hemoglobin: 13.3 g/dL (ref 13.0–17.0)
Immature Granulocytes: 0 %
Lymphocytes Relative: 24 %
Lymphs Abs: 2.4 10*3/uL (ref 0.7–4.0)
MCH: 29.1 pg (ref 26.0–34.0)
MCHC: 32.7 g/dL (ref 30.0–36.0)
MCV: 89.1 fL (ref 80.0–100.0)
Monocytes Absolute: 0.9 10*3/uL (ref 0.1–1.0)
Monocytes Relative: 9 %
Neutro Abs: 6.3 10*3/uL (ref 1.7–7.7)
Neutrophils Relative %: 63 %
Platelets: 337 10*3/uL (ref 150–400)
RBC: 4.57 MIL/uL (ref 4.22–5.81)
RDW: 14.5 % (ref 11.5–15.5)
WBC: 9.9 10*3/uL (ref 4.0–10.5)
nRBC: 0 % (ref 0.0–0.2)

## 2019-12-08 LAB — TYPE AND SCREEN
ABO/RH(D): O POS
Antibody Screen: NEGATIVE

## 2019-12-08 LAB — SURGICAL PCR SCREEN
MRSA, PCR: NEGATIVE
Staphylococcus aureus: NEGATIVE

## 2019-12-08 LAB — SARS CORONAVIRUS 2 (TAT 6-24 HRS): SARS Coronavirus 2: NEGATIVE

## 2019-12-08 NOTE — Patient Instructions (Signed)
Your procedure is scheduled on: Thursday December 10, 2019. Report to Day Surgery inside [facility] 2nd floor. To find out your arrival time please call [phone] between 1PM - 3PM on Wednesday December 09, 2019.  Remember: Instructions that are not followed completely may result in serious medical risk,  up to and including death, or upon the discretion of your surgeon and anesthesiologist your  surgery may need to be rescheduled.     _X__ 1. Do not eat food after midnight the night before your procedure.                 No gum chewing or hard candies. You may drink clear liquids up to 2 hours                 before you are scheduled to arrive for your surgery- DO not drink clear                 liquids within 2 hours of the start of your surgery.                 Clear Liquids include:  water, apple juice without pulp, clear Gatorade, G2 or                  Gatorade Zero (avoid Red/Purple/Blue), Black Coffee or Tea (Do not add                 anything to coffee or tea).  __X__2.   Complete the carbohydrate drink provided to you, 2 hours before arrival.  __X__3.  On the morning of surgery brush your teeth with toothpaste and water, you                may rinse your mouth with mouthwash if you wish.  Do not swallow any toothpaste of mouthwash.     _X__ 4.  No Alcohol for 24 hours before or after surgery.   _X__ 5.  Do Not Smoke or use e-cigarettes For 24 Hours Prior to Your Surgery.                 Do not use any chewable tobacco products for at least 6 hours prior to                 Surgery.  _X__  6.  Do not use any recreational drugs (marijuana, cocaine, heroin, ecstacy, MDMA or other)                For at least one week prior to your surgery.  Combination of these drugs with anesthesia                May have life threatening results.  __X__ 7.  Notify your doctor if there is any change in your medical condition      (cold, fever, infections).     Do not  wear jewelry, make-up, hairpins, clips or nail polish. Do not wear lotions, powders, or perfumes. You may wear deodorant. Do not shave 48 hours prior to surgery. Men may shave face and neck. Do not bring valuables to the hospital.    [facility] is not responsible for any belongings or valuables.  Contacts, dentures or bridgework may not be worn into surgery. Leave your suitcase in the car. After surgery it may be brought to your room. For patients admitted to the hospital, discharge time is determined by your treatment team.   Patients discharged the day of surgery  will not be allowed to drive home.   Make arrangements for someone to be with you for the first 24 hours of your Same Day Discharge.    Please read over the following fact sheets that you were given:   Total Joint Packet    __X__ Take these medicines the morning of surgery with A SIP OF WATER:    1. carvedilol (COREG) 3.125 MG   2. esomeprazole (NEXIUM) 20 MG  3. tamsulosin (FLOMAX) 0.4 MG   4. oxyCODONE ER (XTAMPZA ER) (if needed)   __X__ Use CHG wipes as directed  __X__ Use Benzoyl Peroxide Gel as instructed  __X__ Stop Anti-inflammatories such as Ibuprofen, Aleve, Advil, naproxen, aspirin and or BC powders.   __X__ Stop supplements until after surgery.    __X__ Do not start any herbal supplements before your surgery.

## 2019-12-10 ENCOUNTER — Inpatient Hospital Stay
Admission: RE | Admit: 2019-12-10 | Discharge: 2019-12-11 | DRG: 483 | Disposition: A | Discharge status: Skilled Nursing Facility | Payer: Medicare Other | Attending: Surgery | Admitting: Surgery

## 2019-12-10 ENCOUNTER — Other Ambulatory Visit: Payer: Self-pay

## 2019-12-10 ENCOUNTER — Encounter: Payer: Self-pay | Admitting: Surgery

## 2019-12-10 ENCOUNTER — Inpatient Hospital Stay: Payer: Medicare Other | Admitting: Urgent Care

## 2019-12-10 ENCOUNTER — Inpatient Hospital Stay: Payer: Medicare Other

## 2019-12-10 ENCOUNTER — Encounter
Admission: RE | Disposition: A | Discharge status: Skilled Nursing Facility | Payer: Self-pay | Source: Home / Self Care | Attending: Surgery

## 2019-12-10 ENCOUNTER — Inpatient Hospital Stay: Payer: Medicare Other | Admitting: Anesthesiology

## 2019-12-10 DIAGNOSIS — Z87442 Personal history of urinary calculi: Secondary | ICD-10-CM

## 2019-12-10 DIAGNOSIS — G8929 Other chronic pain: Secondary | ICD-10-CM | POA: Diagnosis present

## 2019-12-10 DIAGNOSIS — Z6841 Body Mass Index (BMI) 40.0 and over, adult: Secondary | ICD-10-CM

## 2019-12-10 DIAGNOSIS — K219 Gastro-esophageal reflux disease without esophagitis: Secondary | ICD-10-CM | POA: Diagnosis present

## 2019-12-10 DIAGNOSIS — Z9081 Acquired absence of spleen: Secondary | ICD-10-CM

## 2019-12-10 DIAGNOSIS — M19012 Primary osteoarthritis, left shoulder: Secondary | ICD-10-CM | POA: Diagnosis present

## 2019-12-10 DIAGNOSIS — Z79890 Hormone replacement therapy (postmenopausal): Secondary | ICD-10-CM

## 2019-12-10 DIAGNOSIS — F17290 Nicotine dependence, other tobacco product, uncomplicated: Secondary | ICD-10-CM | POA: Diagnosis present

## 2019-12-10 DIAGNOSIS — Z79899 Other long term (current) drug therapy: Secondary | ICD-10-CM

## 2019-12-10 DIAGNOSIS — E7800 Pure hypercholesterolemia, unspecified: Secondary | ICD-10-CM | POA: Diagnosis present

## 2019-12-10 DIAGNOSIS — M75112 Incomplete rotator cuff tear or rupture of left shoulder, not specified as traumatic: Secondary | ICD-10-CM | POA: Diagnosis present

## 2019-12-10 DIAGNOSIS — Z419 Encounter for procedure for purposes other than remedying health state, unspecified: Secondary | ICD-10-CM

## 2019-12-10 DIAGNOSIS — Z20822 Contact with and (suspected) exposure to covid-19: Secondary | ICD-10-CM | POA: Diagnosis present

## 2019-12-10 DIAGNOSIS — Z96653 Presence of artificial knee joint, bilateral: Secondary | ICD-10-CM | POA: Diagnosis present

## 2019-12-10 DIAGNOSIS — I739 Peripheral vascular disease, unspecified: Secondary | ICD-10-CM | POA: Diagnosis present

## 2019-12-10 DIAGNOSIS — Z96612 Presence of left artificial shoulder joint: Secondary | ICD-10-CM

## 2019-12-10 DIAGNOSIS — Z8249 Family history of ischemic heart disease and other diseases of the circulatory system: Secondary | ICD-10-CM

## 2019-12-10 DIAGNOSIS — I10 Essential (primary) hypertension: Secondary | ICD-10-CM | POA: Diagnosis present

## 2019-12-10 HISTORY — DX: Chronic kidney disease, unspecified: N18.9

## 2019-12-10 HISTORY — DX: Heart failure, unspecified: I50.9

## 2019-12-10 HISTORY — DX: Peripheral vascular disease, unspecified: I73.9

## 2019-12-10 HISTORY — PX: REVERSE SHOULDER ARTHROPLASTY: SHX5054

## 2019-12-10 SURGERY — ARTHROPLASTY, SHOULDER, TOTAL, REVERSE
Anesthesia: General | Site: Shoulder | Laterality: Left

## 2019-12-10 MED ORDER — ACETAMINOPHEN 500 MG PO TABS
1000.0000 mg | ORAL_TABLET | Freq: Four times a day (QID) | ORAL | Status: DC
  Administered 2019-12-11 (×3): 1000 mg via ORAL
  Filled 2019-12-10 (×4): qty 2

## 2019-12-10 MED ORDER — FENTANYL CITRATE (PF) 100 MCG/2ML IJ SOLN
INTRAMUSCULAR | Status: AC
  Administered 2019-12-10: 50 ug via INTRAVENOUS
  Filled 2019-12-10: qty 2

## 2019-12-10 MED ORDER — HYDROMORPHONE HCL 1 MG/ML IJ SOLN
INTRAMUSCULAR | Status: AC
  Administered 2019-12-10: 0.5 mg via INTRAVENOUS
  Filled 2019-12-10: qty 1

## 2019-12-10 MED ORDER — DEXTROSE 5 % IV SOLN
3.0000 g | Freq: Four times a day (QID) | INTRAVENOUS | Status: AC
  Administered 2019-12-11 (×2): 3 g via INTRAVENOUS
  Filled 2019-12-10: qty 3
  Filled 2019-12-10: qty 3000
  Filled 2019-12-10: qty 3

## 2019-12-10 MED ORDER — OXYCODONE HCL 5 MG PO TABS
5.0000 mg | ORAL_TABLET | ORAL | Status: DC | PRN
  Administered 2019-12-10 – 2019-12-11 (×5): 10 mg via ORAL
  Filled 2019-12-10 (×6): qty 2

## 2019-12-10 MED ORDER — KETOROLAC TROMETHAMINE 30 MG/ML IJ SOLN: 30.0000 mg | Freq: Once | INTRAMUSCULAR | Status: AC

## 2019-12-10 MED ORDER — DIPHENHYDRAMINE HCL 12.5 MG/5ML PO ELIX: 12.5000 mg | ORAL_SOLUTION | ORAL | Status: DC | PRN

## 2019-12-10 MED ORDER — SODIUM CHLORIDE 0.9 % IV SOLN
INTRAVENOUS | Status: DC | PRN
  Administered 2019-12-10: 20 ug/min via INTRAVENOUS

## 2019-12-10 MED ORDER — OXYCODONE ER 27 MG PO C12A: 27.0000 mg | EXTENDED_RELEASE_CAPSULE | Freq: Three times a day (TID) | ORAL | Status: DC

## 2019-12-10 MED ORDER — FENTANYL CITRATE (PF) 100 MCG/2ML IJ SOLN
25.0000 ug | INTRAMUSCULAR | Status: DC | PRN
  Administered 2019-12-10: 50 ug via INTRAVENOUS

## 2019-12-10 MED ORDER — MAGNESIUM HYDROXIDE 400 MG/5ML PO SUSP: 30.0000 mL | Freq: Every day | ORAL | Status: DC | PRN

## 2019-12-10 MED ORDER — FENTANYL CITRATE (PF) 100 MCG/2ML IJ SOLN: 50.0000 ug | INTRAMUSCULAR | Status: DC | PRN

## 2019-12-10 MED ORDER — ONDANSETRON HCL 4 MG/2ML IJ SOLN
INTRAMUSCULAR | Status: AC
  Filled 2019-12-10: qty 2

## 2019-12-10 MED ORDER — DEXAMETHASONE SODIUM PHOSPHATE 10 MG/ML IJ SOLN
INTRAMUSCULAR | Status: DC | PRN
  Administered 2019-12-10: 10 mg via INTRAVENOUS

## 2019-12-10 MED ORDER — BISACODYL 10 MG RE SUPP: 10.0000 mg | Freq: Every day | RECTAL | Status: DC | PRN

## 2019-12-10 MED ORDER — SUGAMMADEX SODIUM 200 MG/2ML IV SOLN
INTRAVENOUS | Status: DC | PRN
  Administered 2019-12-10: 200 mg via INTRAVENOUS

## 2019-12-10 MED ORDER — SUCCINYLCHOLINE CHLORIDE 200 MG/10ML IV SOSY
PREFILLED_SYRINGE | INTRAVENOUS | Status: AC
  Filled 2019-12-10: qty 10

## 2019-12-10 MED ORDER — TAMSULOSIN HCL 0.4 MG PO CAPS
0.4000 mg | ORAL_CAPSULE | Freq: Every day | ORAL | Status: DC
  Administered 2019-12-11: 0.4 mg via ORAL
  Filled 2019-12-10: qty 1

## 2019-12-10 MED ORDER — TRAMADOL HCL 50 MG PO TABS
50.0000 mg | ORAL_TABLET | Freq: Four times a day (QID) | ORAL | Status: DC | PRN
  Administered 2019-12-10 – 2019-12-11 (×2): 50 mg via ORAL
  Filled 2019-12-10 (×2): qty 1

## 2019-12-10 MED ORDER — DEXTROSE 5 % IV SOLN
3.0000 g | INTRAVENOUS | Status: AC
  Administered 2019-12-10: 3 g via INTRAVENOUS
  Filled 2019-12-10: qty 3

## 2019-12-10 MED ORDER — CARVEDILOL 3.125 MG PO TABS
3.1250 mg | ORAL_TABLET | Freq: Two times a day (BID) | ORAL | Status: DC
  Administered 2019-12-11: 3.125 mg via ORAL
  Filled 2019-12-10: qty 1

## 2019-12-10 MED ORDER — DOCUSATE SODIUM 100 MG PO CAPS
100.0000 mg | ORAL_CAPSULE | Freq: Two times a day (BID) | ORAL | Status: DC
  Administered 2019-12-10 – 2019-12-11 (×2): 100 mg via ORAL
  Filled 2019-12-10 (×2): qty 1

## 2019-12-10 MED ORDER — ROCURONIUM BROMIDE 100 MG/10ML IV SOLN
INTRAVENOUS | Status: DC | PRN
  Administered 2019-12-10: 50 mg via INTRAVENOUS

## 2019-12-10 MED ORDER — ENOXAPARIN SODIUM 40 MG/0.4ML ~~LOC~~ SOLN
40.0000 mg | Freq: Two times a day (BID) | SUBCUTANEOUS | Status: DC
  Administered 2019-12-11: 40 mg via SUBCUTANEOUS
  Filled 2019-12-10: qty 0.4

## 2019-12-10 MED ORDER — CHLORHEXIDINE GLUCONATE 0.12 % MT SOLN
OROMUCOSAL | Status: AC
  Administered 2019-12-10: 15 mL via OROMUCOSAL
  Filled 2019-12-10: qty 15

## 2019-12-10 MED ORDER — MIDAZOLAM HCL 2 MG/2ML IJ SOLN
INTRAMUSCULAR | Status: AC
  Filled 2019-12-10: qty 2

## 2019-12-10 MED ORDER — ONDANSETRON HCL 4 MG/2ML IJ SOLN
INTRAMUSCULAR | Status: DC | PRN
  Administered 2019-12-10: 4 mg via INTRAVENOUS

## 2019-12-10 MED ORDER — PROMETHAZINE HCL 25 MG/ML IJ SOLN: 6.2500 mg | INTRAMUSCULAR | Status: DC | PRN

## 2019-12-10 MED ORDER — MELATONIN 5 MG PO TABS
5.0000 mg | ORAL_TABLET | Freq: Every day | ORAL | Status: DC
  Administered 2019-12-10: 5 mg via ORAL
  Filled 2019-12-10: qty 1

## 2019-12-10 MED ORDER — ONDANSETRON HCL 4 MG PO TABS: 4.0000 mg | ORAL_TABLET | Freq: Four times a day (QID) | ORAL | Status: DC | PRN

## 2019-12-10 MED ORDER — LIDOCAINE HCL (PF) 2 % IJ SOLN
INTRAMUSCULAR | Status: AC
  Filled 2019-12-10: qty 5

## 2019-12-10 MED ORDER — LOSARTAN POTASSIUM 25 MG PO TABS
25.0000 mg | ORAL_TABLET | Freq: Every day | ORAL | Status: DC
  Administered 2019-12-11: 25 mg via ORAL
  Filled 2019-12-10: qty 1

## 2019-12-10 MED ORDER — BUPIVACAINE LIPOSOME 1.3 % IJ SUSP
INTRAMUSCULAR | Status: DC | PRN
Start: 2019-12-10 — End: 2019-12-10
  Administered 2019-12-10: 15 mL via PERINEURAL

## 2019-12-10 MED ORDER — METOCLOPRAMIDE HCL 5 MG/ML IJ SOLN: 5.0000 mg | Freq: Three times a day (TID) | INTRAMUSCULAR | Status: DC | PRN

## 2019-12-10 MED ORDER — TRANEXAMIC ACID 1000 MG/10ML IV SOLN
INTRAVENOUS | Status: AC
  Filled 2019-12-10: qty 10

## 2019-12-10 MED ORDER — KETOROLAC TROMETHAMINE 15 MG/ML IJ SOLN
15.0000 mg | Freq: Four times a day (QID) | INTRAMUSCULAR | Status: DC
  Administered 2019-12-10 – 2019-12-11 (×3): 15 mg via INTRAVENOUS
  Filled 2019-12-10 (×3): qty 1

## 2019-12-10 MED ORDER — ENOXAPARIN SODIUM 40 MG/0.4ML ~~LOC~~ SOLN: 40.0000 mg | SUBCUTANEOUS | Status: DC

## 2019-12-10 MED ORDER — PANTOPRAZOLE SODIUM 40 MG PO TBEC
40.0000 mg | DELAYED_RELEASE_TABLET | Freq: Every day | ORAL | Status: DC
  Administered 2019-12-11: 40 mg via ORAL
  Filled 2019-12-10: qty 1

## 2019-12-10 MED ORDER — HYDROCODONE-ACETAMINOPHEN 7.5-325 MG PO TABS: 1.0000 | ORAL_TABLET | Freq: Once | ORAL | Status: DC | PRN

## 2019-12-10 MED ORDER — DEXAMETHASONE SODIUM PHOSPHATE 10 MG/ML IJ SOLN
INTRAMUSCULAR | Status: AC
  Filled 2019-12-10: qty 1

## 2019-12-10 MED ORDER — ROCURONIUM BROMIDE 10 MG/ML (PF) SYRINGE
PREFILLED_SYRINGE | INTRAVENOUS | Status: AC
  Filled 2019-12-10: qty 10

## 2019-12-10 MED ORDER — TRANEXAMIC ACID 1000 MG/10ML IV SOLN
INTRAVENOUS | Status: DC | PRN
  Administered 2019-12-10: 1000 mg via TOPICAL

## 2019-12-10 MED ORDER — MIDAZOLAM HCL 2 MG/2ML IJ SOLN
INTRAMUSCULAR | Status: DC | PRN
  Administered 2019-12-10: 2 mg via INTRAVENOUS

## 2019-12-10 MED ORDER — LIDOCAINE HCL (PF) 1 % IJ SOLN
INTRAMUSCULAR | Status: AC
  Filled 2019-12-10: qty 5

## 2019-12-10 MED ORDER — HYDROMORPHONE HCL 1 MG/ML IJ SOLN
0.5000 mg | INTRAMUSCULAR | Status: DC | PRN
  Administered 2019-12-10: 0.5 mg via INTRAVENOUS

## 2019-12-10 MED ORDER — BUPIVACAINE HCL (PF) 0.5 % IJ SOLN
INTRAMUSCULAR | Status: AC
  Filled 2019-12-10: qty 10

## 2019-12-10 MED ORDER — CHLORHEXIDINE GLUCONATE 0.12 % MT SOLN: 15.0000 mL | Freq: Once | OROMUCOSAL | Status: AC

## 2019-12-10 MED ORDER — HYDROMORPHONE HCL 1 MG/ML IJ SOLN: 0.2500 mg | INTRAMUSCULAR | Status: DC | PRN

## 2019-12-10 MED ORDER — FLEET ENEMA 7-19 GM/118ML RE ENEM: 1.0000 | ENEMA | Freq: Once | RECTAL | Status: DC | PRN

## 2019-12-10 MED ORDER — BUPIVACAINE LIPOSOME 1.3 % IJ SUSP
INTRAMUSCULAR | Status: AC
  Filled 2019-12-10: qty 20

## 2019-12-10 MED ORDER — LIDOCAINE HCL (CARDIAC) PF 100 MG/5ML IV SOSY
PREFILLED_SYRINGE | INTRAVENOUS | Status: DC | PRN
  Administered 2019-12-10: 20 mg via INTRAVENOUS

## 2019-12-10 MED ORDER — GLYCOPYRROLATE 0.2 MG/ML IJ SOLN
INTRAMUSCULAR | Status: DC | PRN
  Administered 2019-12-10: .2 mg via INTRAVENOUS

## 2019-12-10 MED ORDER — BUPIVACAINE-EPINEPHRINE (PF) 0.5% -1:200000 IJ SOLN
INTRAMUSCULAR | Status: AC
  Filled 2019-12-10: qty 30

## 2019-12-10 MED ORDER — BUPIVACAINE HCL (PF) 0.5 % IJ SOLN
INTRAMUSCULAR | Status: AC
  Filled 2019-12-10: qty 30

## 2019-12-10 MED ORDER — BUPIVACAINE-EPINEPHRINE (PF) 0.5% -1:200000 IJ SOLN
INTRAMUSCULAR | Status: DC | PRN
  Administered 2019-12-10: 30 mL via PERINEURAL

## 2019-12-10 MED ORDER — ORAL CARE MOUTH RINSE: 15.0000 mL | Freq: Once | OROMUCOSAL | Status: AC

## 2019-12-10 MED ORDER — ACETAMINOPHEN 10 MG/ML IV SOLN
INTRAVENOUS | Status: DC | PRN
  Administered 2019-12-10: 1000 mg via INTRAVENOUS

## 2019-12-10 MED ORDER — ACETAMINOPHEN 325 MG PO TABS: 325.0000 mg | ORAL_TABLET | Freq: Four times a day (QID) | ORAL | Status: DC | PRN

## 2019-12-10 MED ORDER — TIZANIDINE HCL 4 MG PO TABS
8.0000 mg | ORAL_TABLET | Freq: Three times a day (TID) | ORAL | Status: DC | PRN
  Administered 2019-12-10 – 2019-12-11 (×2): 8 mg via ORAL
  Filled 2019-12-10 (×5): qty 2

## 2019-12-10 MED ORDER — METOCLOPRAMIDE HCL 10 MG PO TABS: 5.0000 mg | ORAL_TABLET | Freq: Three times a day (TID) | ORAL | Status: DC | PRN

## 2019-12-10 MED ORDER — BUPIVACAINE HCL (PF) 0.5 % IJ SOLN
INTRAMUSCULAR | Status: DC | PRN
Start: 2019-12-10 — End: 2019-12-10
  Administered 2019-12-10: 10 mL via PERINEURAL

## 2019-12-10 MED ORDER — SUCCINYLCHOLINE CHLORIDE 20 MG/ML IJ SOLN
INTRAMUSCULAR | Status: DC | PRN
  Administered 2019-12-10: 120 mg via INTRAVENOUS

## 2019-12-10 MED ORDER — PROPOFOL 10 MG/ML IV BOLUS
INTRAVENOUS | Status: DC | PRN
  Administered 2019-12-10: 200 mg via INTRAVENOUS

## 2019-12-10 MED ORDER — ACETAMINOPHEN 10 MG/ML IV SOLN
INTRAVENOUS | Status: AC
  Filled 2019-12-10: qty 100

## 2019-12-10 MED ORDER — ONDANSETRON HCL 4 MG/2ML IJ SOLN: 4.0000 mg | Freq: Four times a day (QID) | INTRAMUSCULAR | Status: DC | PRN

## 2019-12-10 MED ORDER — ACETAMINOPHEN 325 MG PO TABS: 325.0000 mg | ORAL_TABLET | ORAL | Status: DC | PRN

## 2019-12-10 MED ORDER — ACETAMINOPHEN 160 MG/5ML PO SOLN
325.0000 mg | ORAL | Status: DC | PRN
  Filled 2019-12-10: qty 20.3

## 2019-12-10 MED ORDER — MIDAZOLAM HCL 2 MG/2ML IJ SOLN: 1.0000 mg | INTRAMUSCULAR | Status: DC | PRN

## 2019-12-10 MED ORDER — LACTATED RINGERS IV SOLN: INTRAVENOUS | Status: DC

## 2019-12-10 MED ORDER — SODIUM CHLORIDE 0.9 % IV SOLN: INTRAVENOUS | Status: DC

## 2019-12-10 MED ORDER — GLYCOPYRROLATE 0.2 MG/ML IJ SOLN
INTRAMUSCULAR | Status: AC
  Filled 2019-12-10: qty 1

## 2019-12-10 MED ORDER — KETOROLAC TROMETHAMINE 30 MG/ML IJ SOLN
INTRAMUSCULAR | Status: AC
  Administered 2019-12-10: 30 mg via INTRAVENOUS
  Filled 2019-12-10: qty 1

## 2019-12-10 MED ORDER — ATORVASTATIN CALCIUM 20 MG PO TABS
40.0000 mg | ORAL_TABLET | Freq: Every day | ORAL | Status: DC
  Administered 2019-12-10: 40 mg via ORAL
  Filled 2019-12-10: qty 2

## 2019-12-10 MED ORDER — MIDAZOLAM HCL 2 MG/2ML IJ SOLN
INTRAMUSCULAR | Status: AC
  Administered 2019-12-10: 1 mg via INTRAVENOUS
  Filled 2019-12-10: qty 2

## 2019-12-10 MED ORDER — PROPOFOL 10 MG/ML IV BOLUS
INTRAVENOUS | Status: AC
  Filled 2019-12-10: qty 20

## 2019-12-10 SURGICAL SUPPLY — 70 items
BASEPLATE GLENOSPHERE 25 (Plate) ×2 IMPLANT
BASEPLATE GLENOSPHERE 25MM (Plate) ×1 IMPLANT
BEARING HUMERAL 40 STD VITE (Joint) ×3 IMPLANT
BIT DRILL TWIST 2.7 (BIT) ×2 IMPLANT
BIT DRILL TWIST 2.7MM (BIT) ×1
BLADE SAW SAG 25X90X1.19 (BLADE) ×3 IMPLANT
CANISTER SUCT 1200ML W/VALVE (MISCELLANEOUS) ×3 IMPLANT
CANISTER SUCT 3000ML PPV (MISCELLANEOUS) ×6 IMPLANT
CHLORAPREP W/TINT 26 (MISCELLANEOUS) ×3 IMPLANT
COOLER POLAR GLACIER W/PUMP (MISCELLANEOUS) ×3 IMPLANT
COVER BACK TABLE REUSABLE LG (DRAPES) ×3 IMPLANT
COVER WAND RF STERILE (DRAPES) ×3 IMPLANT
DRAPE 3/4 80X56 (DRAPES) ×6 IMPLANT
DRAPE IMP U-DRAPE 54X76 (DRAPES) ×6 IMPLANT
DRAPE INCISE IOBAN 66X45 STRL (DRAPES) ×6 IMPLANT
DRSG OPSITE POSTOP 4X8 (GAUZE/BANDAGES/DRESSINGS) ×3 IMPLANT
ELECT BLADE 6.5 EXT (BLADE) IMPLANT
ELECT CAUTERY BLADE 6.4 (BLADE) ×3 IMPLANT
GLENOSPHERE VERSADIAL 40 +3 (Joint) ×3 IMPLANT
GLOVE BIO SURGEON STRL SZ7.5 (GLOVE) ×12 IMPLANT
GLOVE BIO SURGEON STRL SZ8 (GLOVE) ×12 IMPLANT
GLOVE BIOGEL PI IND STRL 8 (GLOVE) ×1 IMPLANT
GLOVE BIOGEL PI INDICATOR 8 (GLOVE) ×2
GLOVE INDICATOR 8.0 STRL GRN (GLOVE) ×3 IMPLANT
GOWN STRL REUS W/ TWL LRG LVL3 (GOWN DISPOSABLE) ×1 IMPLANT
GOWN STRL REUS W/ TWL XL LVL3 (GOWN DISPOSABLE) ×1 IMPLANT
GOWN STRL REUS W/TWL LRG LVL3 (GOWN DISPOSABLE) ×2
GOWN STRL REUS W/TWL XL LVL3 (GOWN DISPOSABLE) ×2
HOOD PEEL AWAY FLYTE STAYCOOL (MISCELLANEOUS) ×12 IMPLANT
ILLUMINATOR WAVEGUIDE N/F (MISCELLANEOUS) IMPLANT
KIT STABILIZATION SHOULDER (MISCELLANEOUS) ×3 IMPLANT
KIT TURNOVER KIT A (KITS) ×3 IMPLANT
MASK FACE SPIDER DISP (MASK) ×3 IMPLANT
MAT ABSORB  FLUID 56X50 GRAY (MISCELLANEOUS) ×2
MAT ABSORB FLUID 56X50 GRAY (MISCELLANEOUS) ×1 IMPLANT
NDL SAFETY ECLIPSE 18X1.5 (NEEDLE) ×1 IMPLANT
NEEDLE HYPO 18GX1.5 SHARP (NEEDLE) ×2
NEEDLE HYPO 22GX1.5 SAFETY (NEEDLE) ×3 IMPLANT
NEEDLE SPNL 20GX3.5 QUINCKE YW (NEEDLE) ×3 IMPLANT
NS IRRIG 500ML POUR BTL (IV SOLUTION) ×3 IMPLANT
PACK SHDR ARTHRO (MISCELLANEOUS) ×3 IMPLANT
PAD ARMBOARD 7.5X6 YLW CONV (MISCELLANEOUS) ×3 IMPLANT
PAD WRAPON POLAR SHDR UNIV (MISCELLANEOUS) IMPLANT
PAD WRAPON POLAR SHDR XLG (MISCELLANEOUS) ×1 IMPLANT
PENCIL SMOKE EVACUATOR (MISCELLANEOUS) ×3 IMPLANT
PIN THREADED REVERSE (PIN) ×3 IMPLANT
PULSAVAC PLUS IRRIG FAN TIP (DISPOSABLE) ×3
SCREW BONE LOCKING 4.75X35X3.5 (Screw) ×3 IMPLANT
SCREW CENTRAL 6.5X40 (Screw) ×3 IMPLANT
SCREW LOCKING NS 4.75MMX20MM (Screw) ×3 IMPLANT
SCREW NON-LOCK 4.75MMX25MMX3.5 (Screw) ×3 IMPLANT
SLING ULTRA II LG (MISCELLANEOUS) ×3 IMPLANT
SLING ULTRA II M (MISCELLANEOUS) IMPLANT
SOL .9 NS 3000ML IRR  AL (IV SOLUTION) ×2
SOL .9 NS 3000ML IRR UROMATIC (IV SOLUTION) ×1 IMPLANT
SPONGE LAP 18X18 RF (DISPOSABLE) ×3 IMPLANT
STAPLER SKIN PROX 35W (STAPLE) ×3 IMPLANT
STEM SHOULDER 16MMX55MM LONG (Stem) ×3 IMPLANT
SUT ETHIBOND 0 MO6 C/R (SUTURE) ×3 IMPLANT
SUT FIBERWIRE #2 38 BLUE 1/2 (SUTURE) ×12
SUT VIC AB 0 CT1 36 (SUTURE) ×3 IMPLANT
SUT VIC AB 2-0 CT1 27 (SUTURE) ×4
SUT VIC AB 2-0 CT1 TAPERPNT 27 (SUTURE) ×2 IMPLANT
SUTURE FIBERWR #2 38 BLUE 1/2 (SUTURE) ×4 IMPLANT
SYR 10ML LL (SYRINGE) ×3 IMPLANT
SYR 30ML LL (SYRINGE) IMPLANT
TIP FAN IRRIG PULSAVAC PLUS (DISPOSABLE) ×1 IMPLANT
TRAY HUM MINI SHOULDER +0 40D (Shoulder) ×3 IMPLANT
WRAPON POLAR PAD SHDR UNIV (MISCELLANEOUS)
WRAPON POLAR PAD SHDR XLG (MISCELLANEOUS) ×3

## 2019-12-10 NOTE — Transfer of Care (Signed)
Immediate Anesthesia Transfer of Care Note  Patient: Joshua Petersen  Procedure(s) Performed: REVERSE SHOULDER ARTHROPLASTY (Left Shoulder)  Patient Location: PACU  Anesthesia Type:General  Level of Consciousness: awake  Airway & Oxygen Therapy: Patient Spontanous Breathing and Patient connected to face mask oxygen  Post-op Assessment: Report given to RN  Post vital signs: stable  Last Vitals:  Vitals Value Taken Time  BP    Temp    Pulse 83 12/10/19 1303  Resp 15 12/10/19 1303  SpO2 98 % 12/10/19 1303  Vitals shown include unvalidated device data.  Last Pain:  Vitals:   12/10/19 0825  TempSrc: Temporal  PainSc: 5          Complications: No complications documented.

## 2019-12-10 NOTE — Anesthesia Procedure Notes (Signed)
Anesthesia Regional Block: Interscalene brachial plexus block   Pre-Anesthetic Checklist: ,, timeout performed, Correct Patient, Correct Site, Correct Laterality, Correct Procedure, Correct Position, site marked, Risks and benefits discussed,  Surgical consent,  Pre-op evaluation,  At surgeon's request and post-op pain management  Laterality: Upper and Left  Prep: chloraprep       Needles:  Injection technique: Single-shot  Needle Type: Echogenic Stimulator Needle     Needle Length: 10cm  Needle Gauge: 21     Additional Needles:   Procedures: Doppler guided,,,, ultrasound used (permanent image in chart),,,,  Motor weakness within 8 minutes.  Narrative:  Start time: 12/10/2019 9:35 AM End time: 12/10/2019 9:40 AM Injection made incrementally with aspirations every 5 mL.  Events:,,,,,,,,,,, other event  Performed by: Personally  Anesthesiologist: Christia Reading, MD  Additional Notes: Functioning IV was confirmed and O2 [place]/monitors were applied. Light sedation administered as required, patient responsive throughout. A 21ga EchoStim needle was used. Sterile prep and drape,hand hygiene and sterile gloves were used.  Negative aspiration and negative test dose prior to incremental administration of local anesthetic. 1% Lidocaine for skin wheal, 4 ml. Total LA: 61ml - Exparel 16ml & 0.5% Bupivicaine 32ml. U/S images stored in chart. The patient tolerated the procedure well.
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
## 2019-12-10 NOTE — Anesthesia Postprocedure Evaluation (Signed)
Anesthesia Post Note  Patient: Joshua Petersen  Procedure(s) Performed: REVERSE SHOULDER ARTHROPLASTY (Left Shoulder)  Patient location during evaluation: PACU Anesthesia Type: General Level of consciousness: awake and alert Pain management: pain level controlled (Pt c/o pain base of neck/top of shoulder (outside of block coverage)) Vital Signs Assessment: post-procedure vital signs reviewed and stable Respiratory status: spontaneous breathing, nonlabored ventilation, respiratory function stable and patient connected to nasal cannula oxygen Cardiovascular status: blood pressure returned to baseline and stable Postop Assessment: no apparent nausea or vomiting Anesthetic complications: no   No complications documented.   Last Vitals:  Vitals:   12/10/19 1333 12/10/19 1351  BP: (!) 120/59 (!) 121/63  Pulse: 71 67  Resp: 15 12  Temp:    SpO2: 94% (!) 88%    Last Pain:  Vitals:   12/10/19 1351  TempSrc:   PainSc: Asleep                 Christia Reading

## 2019-12-10 NOTE — Op Note (Signed)
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  Post-Op Diagnosis:   Same  Procedure:   Reverse left total shoulder arthroplasty.  Surgeon:   Maryagnes Amos, MD  Assistant:   Horris Latino, PA-C; Volanda Napoleon, PA-S  Anesthesia:   General endotracheal with an interscalene block using Exparel placed preoperatively by the anesthesiologist.  Findings:   As above.  Complications:   None  EBL:    200 cc  Fluids:   1200 cc crystalloid  UOP:   None  TT:   None  Drains:   None  Closure:   Staples  Implants:   All press-fit Biomet Comprehensive system with a #16 micro-humeral stem, a 40 mm humeral tray with a standard insert, and a mini-base plate with a +3 mm offset 40 mm glenosphere.  Brief Clinical Note:   The patient is a 64 year old male with a long history of gradually worsening left shoulder pain. His symptoms have progressed despite medications, activity modification, etc. His history and examination are consistent with degenerative joint disease. Preoperative MRI scan confirmed the degenerative changes but also demonstrated substantial tendinopathy of the rotator cuff with interstitial tearing. The patient presents at this time for a reverse left total shoulder arthroplasty.  Procedure:   The patient underwent placement of an interscalene block using Exparel by the anesthesiologist in the preoperative holding area before being brought into the operating room and lain in the supine position. The patient then underwent general endotracheal intubation and anesthesia before the patient was repositioned in the beach chair position using the beach chair positioner. The left shoulder and upper extremity were prepped with ChloraPrep solution before being draped sterilely. Preoperative antibiotics were administered. A standard anterior approach to the shoulder was made through an  approximately 4-5 inch incision. The incision was carried down through the subcutaneous tissues to expose the deltopectoral fascia. The interval between the deltoid and pectoralis muscles was identified and this plane developed, retracting the cephalic vein laterally with the deltoid muscle. The conjoined tendon was identified. Its lateral margin was dissected and the Kolbel self-retraining retractor inserted. The "three sisters" were identified and cauterized. Bursal tissues were removed to improve visualization. The subscapularis tendon was released from its attachment to the lesser tuberosity 1 cm proximal to its insertion and several tagging sutures placed. The inferior capsule was released with care after identifying and protecting the axillary nerve. The proximal humeral cut was made at approximately 25 of retroversion using the extra-medullary guide.   Attention was redirected to the glenoid. The labrum was debrided circumferentially before the center of the glenoid was marked with electrocautery. The guidewire was drilled into the glenoid neck using the appropriate guide. After verifying its position, it was overreamed with the mini-baseplate reamer to create a flat surface. The permanent mini-baseplate was impacted into place. It was stabilized with a 40 x 6.5 mm central screw and four peripheral screws. Locking screws were placed anteriorly, superiorly, and inferiorly while a nonlocking screw was placed posteriorly. The permanent +3 mm lateralized 40 mm glenosphere was then impacted into place and its Morse taper locking mechanism verified using manual distraction.  Attention was directed to the humeral side. The humeral canal was reamed sequentially beginning with the end-cutting reamer then progressing from a 4 mm reamer up to a 16 mm reamer. This provided excellent circumferential chatter. The canal was broached beginning with a #13 broach and  progressing to a #16 broach. This was left in place and  a trial reduction performed using the standard trial humeral platform. The arm demonstrated excellent range of motion as the hand could be brought across the chest to the opposite shoulder and brought to the top of the patient's head and to the patient's ear. The shoulder appeared stable throughout this range of motion. The joint was dislocated and the trial components removed. The permanent #16 micro-stem was impacted into place with care taken to maintain the appropriate version. The permanent 40 mm humeral platform with the standard insert was put together on the back table and impacted into place. Again, the [facility] taper locking mechanism was verified using manual distraction. The shoulder was relocated using two finger pressure and again placed through a range of motion with the findings as described above.  The wound was copiously irrigated with sterile saline solution using the jet lavage system before a total of 30 cc of 0.5% Sensorcaine with epinephrine was injected into the pericapsular and peri-incisional tissues to help with postoperative analgesia. The subscapularis tendon was reapproximated using #2 FiberWire interrupted sutures. The deltopectoral interval was closed using #0 Vicryl interrupted sutures before the subcutaneous tissues were closed using 2-0 Vicryl interrupted sutures. The skin was closed using staples. Prior to closing the skin, 1 g of transexemic acid in 10 cc of normal saline was injected intra-articularly to help with postoperative bleeding. A sterile occlusive dressing was applied to the wound before the arm was placed into a shoulder immobilizer with an abduction pillow. A Polar Care system also was applied to the shoulder. The patient was then transferred back to a hospital bed before being awakened, extubated, and returned to the recovery room in satisfactory condition after tolerating the procedure well.

## 2019-12-10 NOTE — Anesthesia Preprocedure Evaluation (Addendum)
Anesthesia Evaluation    Airway Mallampati: III  TM Distance: <3 FB Neck ROM: limited    Dental  (+) Poor Dentition, Chipped, Missing   Pulmonary former smoker,    Pulmonary exam normal        Cardiovascular hypertension, +CHF  Normal cardiovascular exam  08/2018 Pharmacological myocardial perfusion imaging study with no significant  Ischemia On attenuation corrected images , there is a very small defect of mild intensity noted in the apical region on stress images, SPECT secondary to attenuation artifact Non-attenuation corrected images with significant inferior wall attenuation artifact consistent with diaphragmatic attenuation Normal wall motion, EF estimated at 59% No EKG changes concerning for ischemia at peak stress or in recovery. CT scan reviewed with at least mild coronary calcification, mild descending aorta atherosclerosis Low risk scan   Neuro/Psych PSYCHIATRIC DISORDERS    GI/Hepatic GERD  Medicated and Controlled,(+) Hepatitis -, C  Endo/Other    Renal/GU Renal disease     Musculoskeletal  (+) Arthritis , Osteoarthritis,    Abdominal   Peds  Hematology   Anesthesia Other Findings   Reproductive/Obstetrics                            Anesthesia Physical Anesthesia Plan  ASA: III  Anesthesia Plan: General   Post-op Pain Management:  Regional for Post-op pain   Induction: Intravenous  PONV Risk Score and Plan:   Airway Management Planned: Oral ETT  Additional Equipment:   Intra-op Plan:   Post-operative Plan: Extubation in OR  Informed Consent: I have reviewed the patients History and Physical, chart, labs and discussed the procedure including the risks, benefits and alternatives for the proposed anesthesia with the patient or authorized representative who has indicated his/her understanding and acceptance.       Plan Discussed with:   Anesthesia Plan Comments:  (Lengthy discussion re GOT and Interscalene Block. Pt accepts block. Stated voice "didn't sound right" after hip (ETT was placed, as was done for TKR 2020), however pt advised that this surgery will require ETT, he acknowledged this and accepts.)        Anesthesia Quick Evaluation

## 2019-12-10 NOTE — Plan of Care (Signed)
°  Problem: Education: Goal: Knowledge of the prescribed therapeutic regimen will improve 12/10/2019 1846 by Jean Rosenthal, RN Outcome: Progressing 12/10/2019 1845 by Jean Rosenthal, RN Outcome: Progressing Goal: Understanding of activity limitations/precautions following surgery will improve 12/10/2019 1846 by Jean Rosenthal, RN Outcome: Progressing 12/10/2019 1845 by Jean Rosenthal, RN Outcome: Progressing Goal: Individualized Educational Video(s) 12/10/2019 1846 by Jean Rosenthal, RN Outcome: Progressing 12/10/2019 1845 by Jean Rosenthal, RN Outcome: Progressing   Problem: Activity: Goal: Ability to tolerate increased activity will improve 12/10/2019 1846 by Jean Rosenthal, RN Outcome: Progressing 12/10/2019 1845 by Jean Rosenthal, RN Outcome: Progressing   Problem: Pain Management: Goal: Pain level will decrease with appropriate interventions 12/10/2019 1846 by Jean Rosenthal, RN Outcome: Progressing 12/10/2019 1845 by Jean Rosenthal, RN Outcome: Progressing   Problem: Education: Goal: Knowledge of the prescribed therapeutic regimen will improve Outcome: Progressing Goal: Understanding of activity limitations/precautions following surgery will improve Outcome: Progressing Goal: Individualized Educational Video(s) Outcome: Progressing   Problem: Activity: Goal: Ability to tolerate increased activity will improve Outcome: Progressing   Problem: Pain Management: Goal: Pain level will decrease with appropriate interventions Outcome: Progressing  Pt to floor - educated on calling for assist, pain management, bed management - L shoulder in sling with ice pack in place- rfa iv infusing - site is wnl - pt able to void upon arrival to unit via ambulation to br . Admission completed

## 2019-12-10 NOTE — Anesthesia Procedure Notes (Signed)
Procedure Name: Intubation Date/Time: 12/10/2019 10:19 AM Performed by: Jaye Beagle, CRNA Pre-anesthesia Checklist: Patient identified, Emergency Drugs available, Suction available, Patient being monitored and Timeout performed Patient Re-evaluated:Patient Re-evaluated prior to induction Oxygen Delivery Method: Circle system utilized Preoxygenation: Pre-oxygenation with 100% oxygen Induction Type: IV induction Ventilation: Two handed mask ventilation required Laryngoscope Size: McGraph and 4 Grade View: Grade I Tube type: Oral Tube size: 7.5 mm Number of attempts: 1 Airway Equipment and Method: Stylet Placement Confirmation: ETT inserted through vocal cords under direct vision Secured at: 22 cm Tube secured with: Tape Dental Injury: Teeth and Oropharynx as per pre-operative assessment

## 2019-12-11 ENCOUNTER — Encounter: Payer: Self-pay | Admitting: Surgery

## 2019-12-11 LAB — BASIC METABOLIC PANEL
Anion gap: 8 (ref 5–15)
BUN: 22 mg/dL (ref 8–23)
CO2: 29 mmol/L (ref 22–32)
Calcium: 8.9 mg/dL (ref 8.9–10.3)
Chloride: 103 mmol/L (ref 98–111)
Creatinine, Ser: 0.77 mg/dL (ref 0.61–1.24)
GFR calc Af Amer: >60 mL/min (ref >60)
GFR calc non Af Amer: >60 mL/min (ref >60)
Glucose, Bld: 136 mg/dL — ABNORMAL HIGH (ref 70–99)
Potassium: 3.9 mmol/L (ref 3.5–5.1)
Sodium: 140 mmol/L (ref 135–145)

## 2019-12-11 LAB — CBC
HCT: 38.5 % — ABNORMAL LOW (ref 39.0–52.0)
Hemoglobin: 13 g/dL (ref 13.0–17.0)
MCH: 29.3 pg (ref 26.0–34.0)
MCHC: 33.8 g/dL (ref 30.0–36.0)
MCV: 86.9 fL (ref 80.0–100.0)
Platelets: 334 10*3/uL (ref 150–400)
RBC: 4.43 MIL/uL (ref 4.22–5.81)
RDW: 14.2 % (ref 11.5–15.5)
WBC: 19.2 10*3/uL — ABNORMAL HIGH (ref 4.0–10.5)
nRBC: 0 % (ref 0.0–0.2)

## 2019-12-11 MED ORDER — OXYCODONE HCL 10 MG PO TABS: 5.0000 mg | ORAL_TABLET | ORAL | 0 refills | Status: DC | PRN

## 2019-12-11 MED ORDER — OXYCODONE HCL 5 MG PO TABS: 5.0000 mg | ORAL_TABLET | ORAL | 0 refills | Status: DC | PRN

## 2019-12-11 MED ORDER — ASPIRIN EC 325 MG PO TBEC: 325.0000 mg | DELAYED_RELEASE_TABLET | Freq: Every day | ORAL | 0 refills | Status: DC

## 2019-12-11 NOTE — Discharge Instructions (Signed)
Interscalene Nerve Block with Exparel  1.  For your surgery you have received an Interscalene Nerve Block with Exparel. 2. Nerve Blocks affect many types of nerves, including nerves that control movement, pain and normal sensation.  You may experience feelings such as numbness, tingling, heaviness, weakness or the inability to move your arm or the feeling or sensation that your arm has "fallen asleep". 3. A nerve block with Exparel can last up to 5 days.  Usually the weakness wears off first.  The tingling and heaviness usually wear off next.  Finally you may start to notice pain.  Keep in mind that this may occur in any order.  Once a nerve block starts to wear off it is usually completely gone within 60 minutes. 4. ISNB may cause mild shortness of breath, a hoarse voice, blurry vision, unequal pupils, or drooping of the face on the same side as the nerve block.  These symptoms will usually resolve with the numbness.  Very rarely the procedure itself can cause mild seizures. 5. If needed, your surgeon will give you a prescription for pain medication.  It will take about 60 minutes for the oral pain medication to become fully effective.  So, it is recommended that you start taking this medication before the nerve block first begins to wear off, or when you first begin to feel discomfort. 6. Take your pain medication only as prescribed.  Pain medication can cause sedation and decrease your breathing if you take more than you need for the level of pain that you have. 7. Nausea is a common side effect of many pain medications.  You may want to eat something before taking your pain medicine to prevent nausea. 8. After an Interscalene nerve block, you cannot feel pain, pressure or extremes in temperature in the effected arm.  Because your arm is numb it is at an increased risk for injury.  To decrease the possibility of injury, please practice the following:  a. While you are awake change the position of  your arm frequently to prevent too much pressure on any one area for prolonged periods of time. b.  If you have a cast or tight dressing, check the color or your fingers every couple of hours.  Call your surgeon with the appearance of any discoloration (white or blue). c. If you are given a sling to wear before you go home, please wear it  at all times until the block has completely worn off.  Do not get up at night without your sling. d. Please contact [facility] or your surgeon if you do not begin to regain sensation after 7 days from the surgery.  Anesthesia may be contacted by calling the Same Day Surgery Department, Mon. through Fri., 6 am to 4 pm at [phone].   e. If you experience any other problems or concerns, please contact your surgeon's office. f. If you experience severe or prolonged shortness of breath go to the nearest emergency department.   Diet: As you were doing prior to hospitalization   Shower:  May shower but keep the wounds dry, use an occlusive plastic wrap, NO SOAKING IN TUB.  If the bandage gets wet, change with a clean dry gauze.  Dressing:  You may change your dressing as needed. Change the dressing with sterile gauze dressing.    Activity:  Increase activity slowly as tolerated, but follow the weight bearing instructions below.  No lifting or driving for 6 weeks.  Weight Bearing:  Non-weightbearing to the left arm.  Pain Medication: Prescribed short acting oxycodone 5-10mg  every 4 hours as needed for pain to take between your doses of chronic extended release oxycodone.  To prevent constipation: you may use a stool softener such as -  Colace (over the counter) 100 mg by mouth twice a day  Drink plenty of fluids (prune juice may be helpful) and high fiber foods Miralax (over the counter) for constipation as needed.    Itching:  If you experience itching with your medications, try taking only a single pain pill, or even half a pain pill at a time.  You  may take up to 10 pain pills per day, and you can also use benadryl over the counter for itching or also to help with sleep.   Precautions:  If you experience chest pain or shortness of breath - call 911 immediately for transfer to the hospital emergency department!!  If you develop a fever greater that 101 F, purulent drainage from wound, increased redness or drainage from wound, or calf pain-Call [facility]                                              Follow- Up Appointment:  Please call for an appointment to be seen in 2 weeks at [facility]

## 2019-12-11 NOTE — Progress Notes (Signed)
Patient says he would wait to see if doctor will release him today. If so he will wash at home. If not he will let us know to assist with his bath

## 2019-12-11 NOTE — Progress Notes (Signed)
[facility]ST consulted to obtain IV access. Upon arrival at pt's bedside, spoke with pt's nurse who stated he no longer needs access as he is being discharged shortly.

## 2019-12-11 NOTE — Evaluation (Addendum)
Occupational Therapy Evaluation Patient Details Name: Joshua Petersen MRN: [id number] DOB: 01-04-1956 Today's Date: 12/11/2019    History of Present Illness Joshua Petersen is a 64 y.o. male who presents today to undergo a left reverse total shoulder arthroplasty with Dr. Joice Lofts. Does have a history of CHF.  He does take chronic narcotic pain medication for chronic pain.   Clinical Impression   Mr Joshua Petersen was seen for an OT evaluation this date. Pt lives at [facility]. Prior to surgery, pt was MOD I using SPC for mobility/ADLs. Pt has orders for LUE to be immobilized and will be NWBing. Pt presents to acute OT demonstrating impaired ADL performance and functional mobility 2/2 impulsivity, poor insight into deficits, functional ROM/balance deficits, and impaired use of non-dominant LUE. Upon arrival, pt found c sling immobilizer off and LUE adducted - pt states "that boy" told him he did not have to wear sling if the neck strap bothered him and he only had to wear it at night. OT attempted education of pt who was impulsive and required repositioning prior to participating in tx. Pt aggreeable to session of donning shirt/sling as he is eager to leave.  Pt currently requires MIN A for UBD and MAX A for application of polar care, compression stockings, and sling/immobilizer. Pt instructed in polar care mgt, compression stockings mgt, sling/immobilizer mgt, LUE precautions, adaptive strategies for bathing/dressing/toileting/grooming, positioning and considerations for sleep, and home/routines modifications to maximize falls prevention, safety, and independence. Handout provided. OT adjusted sling/immobilizer and polar care to improve comfort, optimize positioning, and to maximize skin integrity/safety. Of note, sling on pt is too small, requested unit secretary order larger size. Pt verbalized understanding of all education/training provided. Pt will benefit from skilled OT services to address these limitations  and improve independence in daily tasks. Recommend HHOT services to continue therapy to maximize return to PLOF, address home/routines modifications and safety, minimize falls risk, and minimize caregiver burden.       Follow Up Recommendations  Home health OT    Equipment Recommendations  None recommended by OT    Recommendations for Other Services       Precautions / Restrictions Precautions Precautions: Shoulder Shoulder Interventions: Shoulder sling/immobilizer;Shoulder abduction pillow;Off for dressing/bathing/exercises Restrictions Weight Bearing Restrictions: Yes LUE Weight Bearing: Non weight bearing      Mobility Bed Mobility Overal bed mobility: Needs Assistance Bed Mobility: Supine to Sit     Supine to sit: Min assist     General bed mobility comments: MIN A for trunk elevation and maintain NWBing precautions  Transfers Overall transfer level: Needs assistance Equipment used: None Transfers: Sit to/from [employer] Sit to Stand: Supervision Stand pivot transfers: Supervision            Balance Overall balance assessment: Needs assistance Sitting-balance support: No upper extremity supported;Feet supported Sitting balance-Leahy Scale: Good     Standing balance support: Single extremity supported Standing balance-Leahy Scale: Fair                             ADL either performed or assessed with clinical judgement   ADL Overall ADL's : Needs assistance/impaired                                       General ADL Comments: MIN A don short long sitting in bed - assist to  thread L shoudler. MAX A doff/don polar care and sling immobilizer. MOD I self-drinking/feeding - anticipate setup for opening packaging. SUPERVISION for ADL t/fs      Vision         Perception     Praxis      Pertinent Vitals/Pain Pain Assessment: Faces Faces Pain Scale: Hurts little more Pain Location: L shoulder  Pain  Descriptors / Indicators: Discomfort;Grimacing Pain Intervention(s): Limited activity within patient's tolerance;Monitored during session;Repositioned     Hand Dominance Right   Extremity/Trunk Assessment Upper Extremity Assessment Upper Extremity Assessment: LUE deficits/detail;RUE deficits/detail RUE Deficits / Details: WFL grossly  LUE: Unable to fully assess due to immobilization   Lower Extremity Assessment Lower Extremity Assessment: Generalized weakness       Communication Communication Communication: No difficulties (garbled speech at times )   Cognition Arousal/Alertness: Awake/alert Behavior During Therapy: Impulsive Overall Cognitive Status: Within Functional Limits for tasks assessed                                 General Comments: Impulsive and not easily re-directed to maintain sling immobilizer    General Comments       Exercises Exercises: Other exercises Other Exercises Other Exercises: Pt educated re: OT role, DME recs, d/c recs, functional application of NWBing pcns, polar care mgmt, adapted dressing technques, importance of sling immobilizer Other Exercises: doff gown/polare care, sling, don shirt/polar care/sling immobilizer, sup>sit, sit<>stand, sitting/standing balance/tolerance   Shoulder Instructions      Home Living Family/patient expects to be discharged to:: Skilled nursing facility [facility])                                 Additional Comments: Pt reports living at [facility], has techs to assist as needed however reports they "don't do much good."       Prior Functioning/Environment Level of Independence: Independent with assistive device(s)        Comments: Pt reports MOD I for mobility/ADLs using SPC as needed, limited community mobility 2/2 chronic pain         OT Problem List: Decreased range of motion;Decreased activity tolerance;Decreased safety awareness;Impaired UE functional  use      OT Treatment/Interventions: Self-care/ADL training;Therapeutic exercise;Energy conservation;DME and/or AE instruction;Therapeutic activities;Patient/family education;Balance training    OT Goals(Current goals can be found in the care plan section) Acute Rehab OT Goals Patient Stated Goal: to return home OT Goal Formulation: With patient Time For Goal Achievement: 12/25/19 Potential to Achieve Goals: Good ADL Goals Pt Will Perform Grooming: with set-up;sitting (c no cues for shoulder NWBing pcns) Pt Will Perform Upper Body Dressing: with supervision;sitting (c MIN VCs) Pt Will Perform Toileting - Clothing Manipulation and hygiene: with modified independence;sitting/lateral leans  OT Frequency: Min 1X/week   Barriers to D/C: Decreased caregiver support          Co-evaluation              AM-PAC OT "6 Clicks" Daily Activity     Outcome Measure Help from another person eating meals?: None Help from another person taking care of personal grooming?: A Little Help from another person toileting, which includes using toliet, bedpan, or urinal?: A Little Help from another person bathing (including washing, rinsing, drying)?: A Little Help from another person to put on and taking off regular upper body clothing?: A Little Help from  another person to put on and taking off regular lower body clothing?: A Little 6 Click Score: 19   End of Session Equipment Utilized During Treatment: Other (comment) (shoulder sling immobilizer) Nurse Communication: Other (comment) (need larger sling immobilizer)  Activity Tolerance: Patient tolerated treatment well Patient left: in bed;with call bell/phone within reach  OT Visit Diagnosis: Other abnormalities of gait and mobility (R26.89)                Time: 7225-7505 OT Time Calculation (min): 28 min Charges:  OT General Charges $OT Visit: 1 Visit OT Evaluation $OT Eval Moderate Complexity: 1 Mod OT Treatments $Self Care/Home  Management : 23-37 mins  Kathie Dike, M.S. OTR/L  12/11/19, 10:21 AM  ascom [phone]

## 2019-12-11 NOTE — TOC Progression Note (Signed)
Transition of Care [facility]) - Progression Note    Patient Details  Name: Joshua Petersen MRN: [id number] Date of Birth: 1955-10-21  [facility]) CM/SW Contact  Barrie Dunker, RN Phone Number: 12/11/2019, 10:45 AM  Clinical Narrative:   Patient lives at [facility] healthcare, will need Fl2 and DC summary to dc back to [facility],          Expected Discharge Plan and Services                                                 Social Determinants of Health (SDOH) Interventions    Readmission Risk Interventions Readmission Risk Prevention Plan 04/24/2019  Medication Review (RN Care Manager) Complete  Palliative Care Screening Not Applicable  Some recent data might be hidden

## 2019-12-11 NOTE — Plan of Care (Signed)
Pt concerned with pain medication regimen. At beginning of shift, states that pain will not be controlled with oxy 10mg  q4h. Wants an increase to equal what he takes at home. In general, pt states he is not comfortable lying in bed on his back, or having sling around neck. Staff has responded to needs of repositioning and pain control throughout the shift. Pt understands sling must be worn at this time and prn pain meds will be given when needed as well as his scheduled. Problem: Education: Goal: Knowledge of the prescribed therapeutic regimen will improve Outcome: Progressing Goal: Understanding of activity limitations/precautions following surgery will improve Outcome: Progressing Goal: Individualized Educational Video(s) Outcome: Progressing   Problem: Activity: Goal: Ability to tolerate increased activity will improve Outcome: Progressing   Problem: Pain Management: Goal: Pain level will decrease with appropriate interventions Outcome: Progressing   Problem: Education: Goal: Knowledge of the prescribed therapeutic regimen will improve Outcome: Progressing Goal: Understanding of activity limitations/precautions following surgery will improve Outcome: Progressing Goal: Individualized Educational Video(s) Outcome: Progressing   Problem: Activity: Goal: Ability to tolerate increased activity will improve Outcome: Progressing   Problem: Pain Management: Goal: Pain level will decrease with appropriate interventions Outcome: Progressing

## 2019-12-11 NOTE — Evaluation (Signed)
Physical Therapy Evaluation Patient Details Name: Joshua Petersen MRN: [id number] DOB: [birth year] Today's Date: 12/11/2019   History of Present Illness  Joshua Petersen is a 64 y.o. male who presents today to undergo a left reverse total shoulder arthroplasty with Dr. Joice Lofts. Does have a history of CHF.  He does take chronic narcotic pain medication for chronic pain.     Clinical Impression  Pt received in supine with HOB elevated and sling donned.  Pt's sling is not appropriate size, as OT has already noted and communicated with staff.  Pt was able to give history of prior level of function and living situation while in supine position.  Pt then transferred to sitting EOB and was able to demonstrate adequate MMT of LE with not complications.  Pt does note pain in the low back and in his B LE's that he attributes previous work and not being as active in past.  Pt able to perform transfers with supervision and reminder to not use L UE per precautions already communicated with pt.  Pt was then able to ambulate 120 ft with no complication.  Pt did utilize [facility] for ambulation, but did not utilize it correctly and would likely benefit from not using to stay safe.  Pt unable to walk greater distance due to fatigue and pain in the LEs and back.  Pt will continue to benefit from skilled therapy to address these concerns and current d/c recommendation to home with HHPT is most appropriate at this time.     Follow Up Recommendations Home health PT    Equipment Recommendations  None recommended by PT    Recommendations for Other Services       Precautions / Restrictions Precautions Precautions: Shoulder Shoulder Interventions: Shoulder sling/immobilizer;Shoulder abduction pillow;Off for dressing/bathing/exercises Restrictions Weight Bearing Restrictions: Yes LUE Weight Bearing: Non weight bearing      Mobility  Bed Mobility Overal bed mobility: Needs Assistance Bed Mobility: Supine to Sit      Supine to sit: Min assist     General bed mobility comments: MIN A for trunk elevation and maintain NWBing precautions  Transfers Overall transfer level: Needs assistance Equipment used: None Transfers: Sit to/from [employer] Sit to Stand: Supervision Stand pivot transfers: Supervision          Ambulation/Gait Ambulation/Gait assistance: Supervision Gait Distance (Feet): 120 Feet Assistive device: Straight cane Gait Pattern/deviations: WFL(Within Functional Limits) Gait velocity: decreased   General Gait Details: Pt does not use cane effectively even with the proper instruction.  Stairs            Wheelchair Mobility    Modified Rankin (Stroke Patients Only)       Balance Overall balance assessment: Needs assistance Sitting-balance support: No upper extremity supported;Feet supported Sitting balance-Leahy Scale: Good     Standing balance support: Single extremity supported Standing balance-Leahy Scale: Fair                               Pertinent Vitals/Pain Pain Assessment: Faces Faces Pain Scale: Hurts little more Pain Location: L shoulder  Pain Descriptors / Indicators: Discomfort;Grimacing Pain Intervention(s): Limited activity within patient's tolerance;Monitored during session;Repositioned    Home Living Family/patient expects to be discharged to:: Skilled nursing facility [employer])                 Additional Comments: Pt reports living at [facility], has techs to assist as needed however reports  they "don't do much good."     Prior Function Level of Independence: Independent with assistive device(s)         Comments: Pt reports MOD I for mobility/ADLs using SPC as needed, limited community mobility 2/2 chronic pain      Hand Dominance   Dominant Hand: Right    Extremity/Trunk Assessment   Upper Extremity Assessment Upper Extremity Assessment: LUE deficits/detail RUE  Deficits / Details: WFL grossly  LUE: Unable to fully assess due to immobilization    Lower Extremity Assessment Lower Extremity Assessment: Generalized weakness (Pt has pain in B LEs and back with ambulation.)       Communication   Communication: No difficulties (garbled speech at times )  Cognition Arousal/Alertness: Awake/alert Behavior During Therapy: Impulsive Overall Cognitive Status: Within Functional Limits for tasks assessed                                 General Comments: Impulsive and not easily re-directed to maintain sling immobilizer       General Comments      Exercises Other Exercises Other Exercises: Pt educated re: OT role, DME recs, d/c recs, functional application of NWBing pcns, polar care mgmt, adapted dressing technques, importance of sling immobilizer Other Exercises: doff gown/polare care, sling, don shirt/polar care/sling immobilizer, sup>sit, sit<>stand, sitting/standing balance/tolerance   Assessment/Plan    PT Assessment Patient needs continued PT services  PT Problem List Decreased mobility;Decreased safety awareness;Decreased range of motion;Decreased knowledge of precautions;Obesity;Decreased activity tolerance;Decreased balance;Decreased knowledge of use of DME;Pain       PT Treatment Interventions Gait training;Therapeutic exercise;Patient/family education    PT Goals (Current goals can be found in the Care Plan section)  Acute Rehab PT Goals Patient Stated Goal: to return home PT Goal Formulation: With patient Time For Goal Achievement: 12/25/19 Potential to Achieve Goals: Good    Frequency Min 2X/week   Barriers to discharge        Co-evaluation               AM-PAC PT "6 Clicks" Mobility  Outcome Measure Help needed turning from your back to your side while in a flat bed without using bedrails?: A Little Help needed moving from lying on your back to sitting on the side of a flat bed without using  bedrails?: A Little Help needed moving to and from a bed to a chair (including a wheelchair)?: A Little Help needed standing up from a chair using your arms (e.g., wheelchair or bedside chair)?: A Little Help needed to walk in hospital room?: A Little Help needed climbing 3-5 steps with a railing? : A Little 6 Click Score: 18    End of Session   Activity Tolerance: Patient tolerated treatment well;Patient limited by fatigue;Patient limited by pain Patient left: in bed;with call bell/phone within reach Nurse Communication: Mobility status PT Visit Diagnosis: Muscle weakness (generalized) (M62.81);Pain Pain - Right/Left: Left Pain - part of body: Hip;Knee (Back)    Time: 3474-2595 PT Time Calculation (min) (ACUTE ONLY): 26 min   Charges:   PT Evaluation $PT Eval Low Complexity: 1 Low PT Treatments $Gait Training: 8-22 mins        Nolon Bussing, PT, DPT 12/11/19, 12:08 PM

## 2019-12-11 NOTE — TOC Transition Note (Signed)
Transition of Care [facility]) - CM/SW Discharge Note   Patient Details  Name: Joshua Petersen MRN: [id number] Date of Birth: [birth year]  Transition of Care [facility]) CM/SW Contact:  Barrie Dunker, RN Phone Number: 12/11/2019, 12:59 PM   Clinical Narrative:   Clovis Cao, DC summary and orders sent thru the Hub to [facility], Patient will not be using EMS Bedside nurse to call report to [facility]           Patient Goals and CMS Choice        Discharge Placement                       Discharge Plan and Services                                     Social Determinants of Health (SDOH) Interventions     Readmission Risk Interventions Readmission Risk Prevention Plan 04/24/2019  Medication Review (RN Care Manager) Complete  Palliative Care Screening Not Applicable  Some recent data might be hidden

## 2019-12-11 NOTE — NC FL2 (Signed)
[place] MEDICAID FL2 LEVEL OF CARE SCREENING TOOL     IDENTIFICATION  Patient Name: Joshua Petersen Birthdate: 05-Jul-1955 Sex: male Admission Date (Current Location): 12/10/2019  [place] Number:  Chiropodist and Address:  [facility] [address], [place], [place] [address]      Provider Number: [id number]  Attending Physician Name and Address:  Christena Flake, MD  Relative Name and Phone Number:  go back to [facility]    Current Level of Care: Hospital Recommended Level of Care: Nursing Facility Prior Approval Number:    Date Approved/Denied:   PASRR Number: [id number]  Discharge Plan: Other (Comment) [facility])    Current Diagnoses: Patient Active Problem List   Diagnosis Date Noted  . 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 Generalized weakness   . Alcohol abuse 05/02/2019  . Alcoholic cardiomyopathy (HCC)   . Hypernatremia   . Opioid abuse (HCC)   . Acute metabolic encephalopathy   . Essential hypertension   . Acute systolic CHF (congestive heart failure) (HCC) 04/28/2019  . Alcohol withdrawal syndrome with complication, with unspecified complication (HCC) 04/24/2019  . Acute respiratory failure with hypoxia and hypercapnia (HCC) 04/24/2019  . AKI (acute kidney injury) (HCC) 04/24/2019  . Drug overdose 04/23/2019  . Status post total knee replacement using cement, right 12/11/2018  . Chronic pain of right knee 06/05/2018  . 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 Primary osteoarthritis of right knee 05/08/2018  . 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 Morbid obesity with BMI of 40.0-44.9, adult (HCC) 12/28/2015    Orientation RESPIRATION BLADDER Height & Weight     Self, Time, Situation, Place  Normal Continent Weight: (!) 124.7 kg Height:  5\' 8"  (172.7 cm)  BEHAVIORAL SYMPTOMS/MOOD NEUROLOGICAL BOWEL  NUTRITION STATUS      Continent Diet (carb modified)  AMBULATORY STATUS COMMUNICATION OF NEEDS Skin   Limited Assist Verbally Surgical wounds                       Personal Care Assistance Level of Assistance  Bathing Bathing Assistance: Limited assistance         Functional Limitations Info             SPECIAL CARE FACTORS FREQUENCY  OT (By licensed OT)       OT Frequency: 2-3 days per week            Contractures Contractures Info: Not present    Additional Factors Info  Code Status Code Status Info: full code             Current Medications (12/11/2019):  This is the current hospital active medication list Current Facility-Administered Medications  Medication Dose Route Frequency Provider Last Rate Last Admin  . 0.9 %  sodium chloride infusion   Intravenous Continuous Poggi, 12/13/2019, MD 75 mL/hr at 12/10/19 1755 New Bag at 12/10/19 1755  . acetaminophen (TYLENOL) tablet 1,000 mg  1,000 mg Oral Q6H Poggi, 12/12/19, MD   1,000 mg at 12/11/19 12/13/19  . acetaminophen (TYLENOL) tablet 325-650 mg  325-650 mg Oral Q6H PRN Poggi, 5621, MD      . atorvastatin (LIPITOR) tablet 40 mg  40 mg Oral Daily Poggi, Excell Seltzer, MD   40 mg at 12/10/19 2205  . bisacodyl (DULCOLAX) suppository 10 mg  10 mg Rectal Daily PRN 2206  J, MD      . carvedilol (COREG) tablet 3.125 mg  3.125 mg Oral BID WC Poggi, Excell Seltzer, MD   3.125 mg at 12/11/19 0939  . ceFAZolin (ANCEF) 3 g in dextrose 5 % 50 mL IVPB  3 g Intravenous Q6H Poggi, Excell Seltzer, MD 100 mL/hr at 12/11/19 0619 3 g at 12/11/19 0619  . diphenhydrAMINE (BENADRYL) 12.5 MG/5ML elixir 12.5-25 mg  12.5-25 mg Oral Q4H PRN Poggi, Excell Seltzer, MD      . docusate sodium (COLACE) capsule 100 mg  100 mg Oral BID Christena Flake, MD   100 mg at 12/11/19 0939  . enoxaparin (LOVENOX) injection 40 mg  40 mg Subcutaneous Q12H Ronnald Ramp, RPH   40 mg at 12/11/19 6720  . HYDROmorphone (DILAUDID) injection 0.25-0.5 mg  0.25-0.5 mg Intravenous Q2H PRN  Poggi, Excell Seltzer, MD      . ketorolac (TORADOL) 15 MG/ML injection 15 mg  15 mg Intravenous Q6H Poggi, Excell Seltzer, MD   15 mg at 12/11/19 9470  . losartan (COZAAR) tablet 25 mg  25 mg Oral Daily Poggi, Excell Seltzer, MD   25 mg at 12/11/19 9628  . magnesium hydroxide (MILK OF MAGNESIA) suspension 30 mL  30 mL Oral Daily PRN Poggi, Excell Seltzer, MD      . melatonin tablet 5 mg  5 mg Oral QHS Poggi, Excell Seltzer, MD   5 mg at 12/10/19 2205  . metoCLOPramide (REGLAN) tablet 5-10 mg  5-10 mg Oral Q8H PRN Poggi, Excell Seltzer, MD       Or  . metoCLOPramide (REGLAN) injection 5-10 mg  5-10 mg Intravenous Q8H PRN Poggi, Excell Seltzer, MD      . ondansetron (ZOFRAN) tablet 4 mg  4 mg Oral Q6H PRN Poggi, Excell Seltzer, MD       Or  . ondansetron (ZOFRAN) injection 4 mg  4 mg Intravenous Q6H PRN Poggi, Excell Seltzer, MD      . oxyCODONE (Oxy IR/ROXICODONE) immediate release tablet 5-10 mg  5-10 mg Oral Q4H PRN Poggi, Excell Seltzer, MD   10 mg at 12/11/19 0939  . pantoprazole (PROTONIX) EC tablet 40 mg  40 mg Oral Daily Poggi, Excell Seltzer, MD   40 mg at 12/11/19 0939  . sodium phosphate (FLEET) 7-19 GM/118ML enema 1 enema  1 enema Rectal Once PRN Poggi, Excell Seltzer, MD      . tamsulosin (FLOMAX) capsule 0.4 mg  0.4 mg Oral Daily Poggi, Excell Seltzer, MD   0.4 mg at 12/11/19 0939  . tiZANidine (ZANAFLEX) tablet 8 mg  8 mg Oral Q8H PRN Poggi, Excell Seltzer, MD   8 mg at 12/10/19 2212  . traMADol (ULTRAM) tablet 50 mg  50 mg Oral Q6H PRN Poggi, Excell Seltzer, MD   50 mg at 12/11/19 0940     Discharge Medications: Please see discharge summary for a list of discharge medications.  Relevant Imaging Results:  Relevant Lab Results:   Additional Information SSN [id number]  Barrie Dunker, RN

## 2019-12-11 NOTE — Plan of Care (Signed)
°  Problem: Education: Goal: Knowledge of the prescribed therapeutic regimen will improve 12/11/2019 0809 by Jean Rosenthal, RN Outcome: Progressing 12/10/2019 1846 by Jean Rosenthal, RN Outcome: Progressing 12/10/2019 1845 by Jean Rosenthal, RN Outcome: Progressing Goal: Understanding of activity limitations/precautions following surgery will improve 12/11/2019 0809 by Jean Rosenthal, RN Outcome: Progressing 12/10/2019 1846 by Jean Rosenthal, RN Outcome: Progressing 12/10/2019 1845 by Jean Rosenthal, RN Outcome: Progressing Goal: Individualized Educational Video(s) 12/11/2019 0809 by Jean Rosenthal, RN Outcome: Progressing 12/10/2019 1846 by Jean Rosenthal, RN Outcome: Progressing 12/10/2019 1845 by Jean Rosenthal, RN Outcome: Progressing   Problem: Activity: Goal: Ability to tolerate increased activity will improve 12/11/2019 0809 by Jean Rosenthal, RN Outcome: Progressing 12/10/2019 1846 by Jean Rosenthal, RN Outcome: Progressing 12/10/2019 1845 by Jean Rosenthal, RN Outcome: Progressing   Problem: Pain Management: Goal: Pain level will decrease with appropriate interventions 12/11/2019 0809 by Jean Rosenthal, RN Outcome: Progressing 12/10/2019 1846 by Jean Rosenthal, RN Outcome: Progressing 12/10/2019 1845 by Jean Rosenthal, RN Outcome: Progressing   Problem: Education: Goal: Knowledge of the prescribed therapeutic regimen will improve 12/11/2019 0809 by Jean Rosenthal, RN Outcome: Progressing 12/10/2019 1846 by Jean Rosenthal, RN Outcome: Progressing Goal: Understanding of activity limitations/precautions following surgery will improve 12/11/2019 0809 by Jean Rosenthal, RN Outcome: Progressing 12/10/2019 1846 by Jean Rosenthal, RN Outcome: Progressing Goal: Individualized Educational Video(s) 12/11/2019 0809 by Jean Rosenthal, RN Outcome: Progressing 12/10/2019 1846 by Jean Rosenthal, RN Outcome: Progressing    Problem: Activity: Goal: Ability to tolerate increased activity will improve 12/11/2019 0809 by Jean Rosenthal, RN Outcome: Progressing 12/10/2019 1846 by Jean Rosenthal, RN Outcome: Progressing   Problem: Pain Management: Goal: Pain level will decrease with appropriate interventions 12/11/2019 0809 by Jean Rosenthal, RN Outcome: Progressing 12/10/2019 1846 by Jean Rosenthal, RN Outcome: Progressing  Pt resting comfortably this am - states pain is managed but he still is requesting his regular pain meds he takes @ home-  Per PA pt will be discharged today and will continue on ordered prns.  IV is removed this am d/t infiltration per night RN-  skin is warm dry intact with no redness or discomfort to site.  Polar pak remains in place and pt deneis new concerns.
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
## 2019-12-14 LAB — SURGICAL PATHOLOGY

## 2020-01-22 ENCOUNTER — Non-Acute Institutional Stay: Payer: Medicare Other | Admitting: Nurse Practitioner

## 2020-01-22 ENCOUNTER — Encounter: Payer: Self-pay | Admitting: Nurse Practitioner

## 2020-01-22 VITALS — BP 136/67 | HR 98 | Resp 18 | Wt 266.1 lb

## 2020-01-22 DIAGNOSIS — Z515 Encounter for palliative care: Secondary | ICD-10-CM

## 2020-01-22 DIAGNOSIS — I426 Alcoholic cardiomyopathy: Secondary | ICD-10-CM
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
## 2020-01-25 ENCOUNTER — Other Ambulatory Visit: Payer: Self-pay

## 2020-02-04 ENCOUNTER — Other Ambulatory Visit: Payer: Self-pay

## 2020-02-04 ENCOUNTER — Encounter: Payer: Self-pay | Admitting: Cardiovascular Disease

## 2020-02-04 ENCOUNTER — Ambulatory Visit (INDEPENDENT_AMBULATORY_CARE_PROVIDER_SITE_OTHER): Payer: Medicare Other | Admitting: Cardiovascular Disease

## 2020-02-04 VITALS — BP 144/70 | HR 64 | Ht 68.0 in | Wt 282.0 lb

## 2020-02-04 DIAGNOSIS — I10 Essential (primary) hypertension: Secondary | ICD-10-CM | POA: Diagnosis not present

## 2020-02-04 DIAGNOSIS — I5022 Chronic systolic (congestive) heart failure: Secondary | ICD-10-CM | POA: Diagnosis not present

## 2020-02-04 DIAGNOSIS — E782 Mixed hyperlipidemia: Secondary | ICD-10-CM

## 2020-02-04 MED ORDER — ASPIRIN 81 MG PO TBEC: 81.0000 mg | DELAYED_RELEASE_TABLET | Freq: Every day | ORAL | Status: DC

## 2020-02-04 MED ORDER — LOSARTAN POTASSIUM 50 MG PO TABS: 50.0000 mg | ORAL_TABLET | Freq: Every day | ORAL | 2 refills | Status: DC

## 2020-02-04 NOTE — Patient Instructions (Signed)
Medication Instructions:  Your physician has recommended you make the following change in your medication:   DECREASE Aspirin to 81 mg daily  INCREASE Losartan to 50 mg daily  *If you need a refill on your cardiac medications before your next appointment, please call your pharmacy*   Lab Work: None ordered If you have labs (blood work) drawn today and your tests are completely normal, you will receive your results only by: Marland Kitchen MyChart Message (if you have MyChart) OR . A paper copy in the mail If you have any lab test that is abnormal or we need to change your treatment, we will call you to review the results.   Testing/Procedures: None ordered   Follow-Up: At [facility], you and your health needs are our priority.  As part of our continuing mission to provide you with exceptional heart care, we have created designated Provider Care Teams.  These Care Teams include your primary Cardiologist (physician) and Advanced Practice Providers (APPs -  Physician Assistants and Nurse Practitioners) who all work together to provide you with the care you need, when you need it.  We recommend signing up for the patient portal called "MyChart".  Sign up information is provided on this After Visit Summary.  MyChart is used to connect with patients for Virtual Visits (Telemedicine).  Patients are able to view lab/test results, encounter notes, upcoming appointments, etc.  Non-urgent messages can be sent to your provider as well.   To learn more about what you can do with MyChart, go to [email or web address].    Your next appointment:   6 month(s)  The format for your next appointment:   In Person  Provider:   You may see Lorine Bears, MD or one of the following Advanced Practice Providers on your designated Care Team:    Nicolasa Ducking, NP  Eula Listen, PA-C  Marisue Ivan, PA-C  Cadence Fransico Michael, [place]    Other Instructions N/A
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
## 2020-05-21 IMAGING — MR MR HEAD W/O CM
15 series · 48 of 48 positions shown · non-contrast
Comparison: Head CT 04/23/2019

CLINICAL DATA: Follow-up stroke.  Altered mental status.

EXAM:
MRI HEAD WITHOUT CONTRAST
TECHNIQUE: Multiplanar, multiecho pulse sequences of the brain and surrounding
structures were obtained without intravenous contrast.

[Series 9: ax dwi_tracew · axial · 3.0mm · 0.60mm/px · z∈[-74,+77]mm · 5 of 50 slices shown (1 of 3)]
[im 1/50]
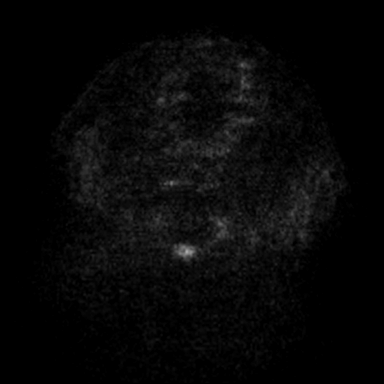
[im 13/50]
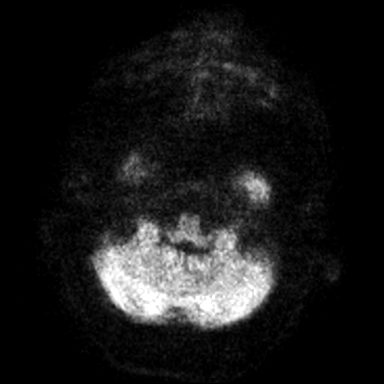
[im 25/50]
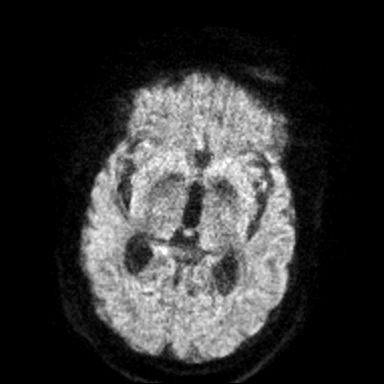
[im 37/50]
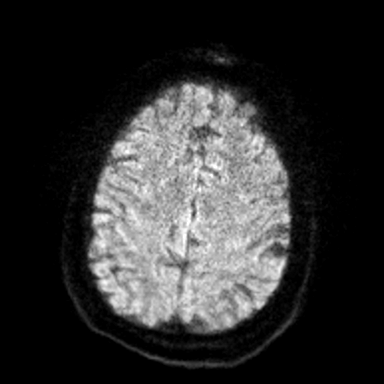
[im 50/50]
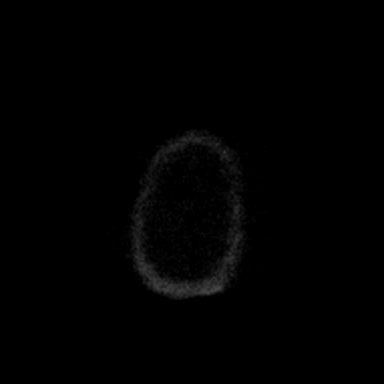

[Series 10: ax dwi_adc · axial · 3.0mm · 0.60mm/px · z∈[-74,+77]mm · 5 of 50 slices shown (1 of 3)]
[im 1/50]
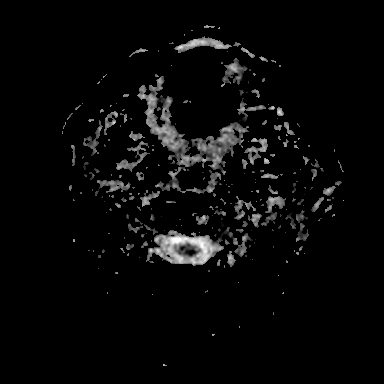
[im 13/50]
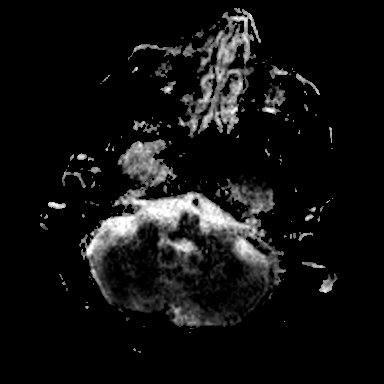
[im 25/50]
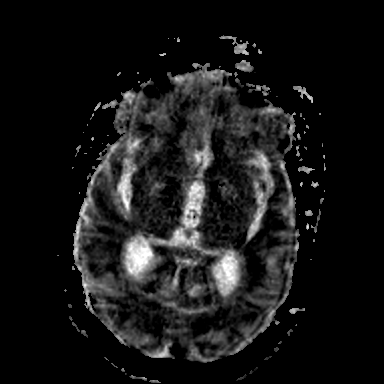
[im 37/50]
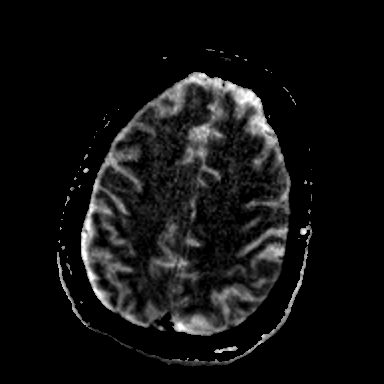
[im 50/50]
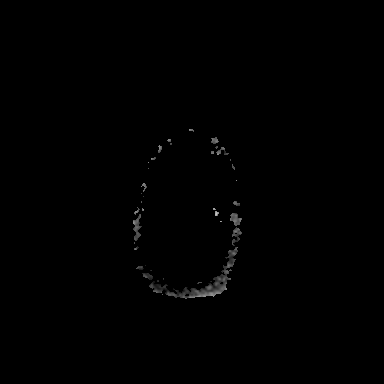

[Series 11: cor dwi_tracew · coronal · 5.0mm · 1.31mm/px · 4 of 38 slices shown]
[im 1/38]
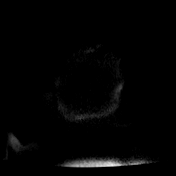
[im 13/38]
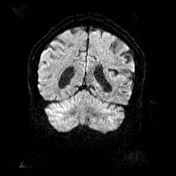
[im 25/38]
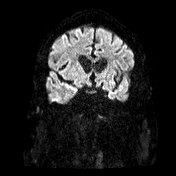
[im 38/38]
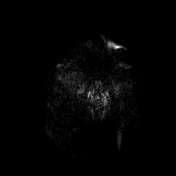

[Series 12: cor dwi_adc · coronal · 5.0mm · 1.31mm/px · 3 of 38 slices shown]
[im 1/38]
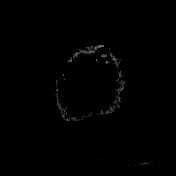
[im 19/38]
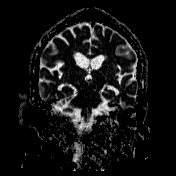
[im 38/38]
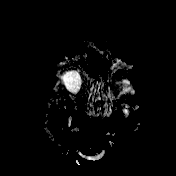

[Series 13: T1 · sagittal · 5.0mm · 0.94mm/px · 2 of 23 slices shown (1 of 3)]
[im 1/23]
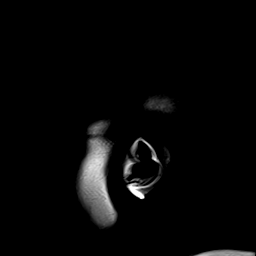
[im 23/23]
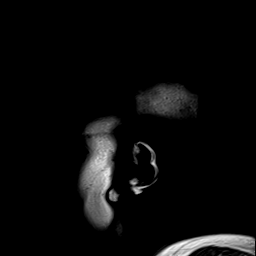

[Series 14: ax dwi_tracew · axial · 3.0mm · 1.36mm/px · z∈[-72,+79]mm · 4 of 50 slices shown (2 of 3)]
[im 1/50]
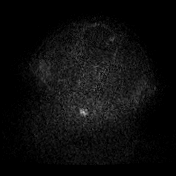
[im 17/50]
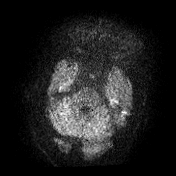
[im 33/50]
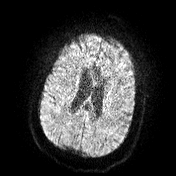
[im 50/50]
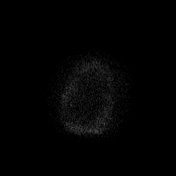

[Series 15: ax dwi_adc · axial · 3.0mm · 1.36mm/px · z∈[-72,+79]mm · 4 of 50 slices shown (2 of 3)]
[im 1/50]
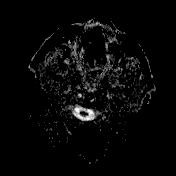
[im 17/50]
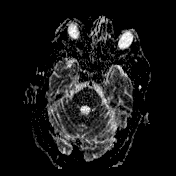
[im 33/50]
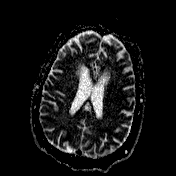
[im 50/50]
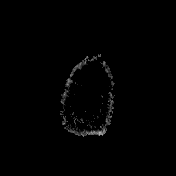

[Series 16: T2 · axial · 5.0mm · 0.45mm/px · z∈[-67,+84]mm · 2 of 28 slices shown (1 of 2)]
[im 1/28]
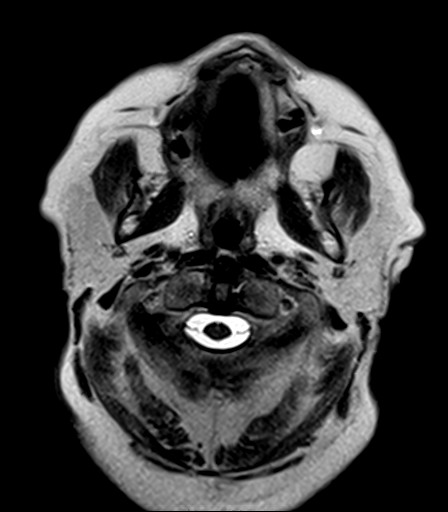
[im 28/28]
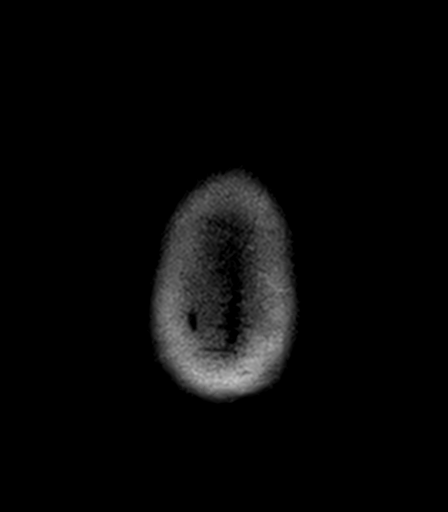

[Series 17: T2-star · axial · 5.0mm · 0.47mm/px · z∈[-66,+86]mm · 2 of 28 slices shown]
[im 1/28]
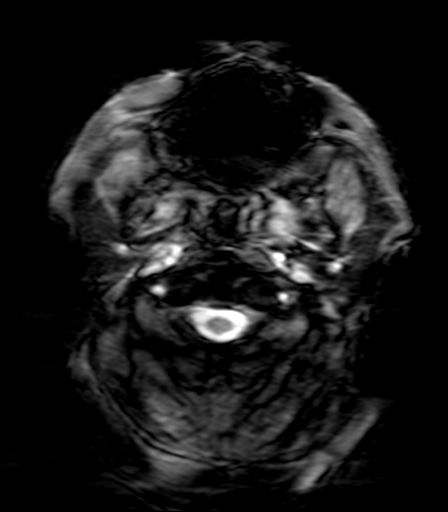
[im 28/28]
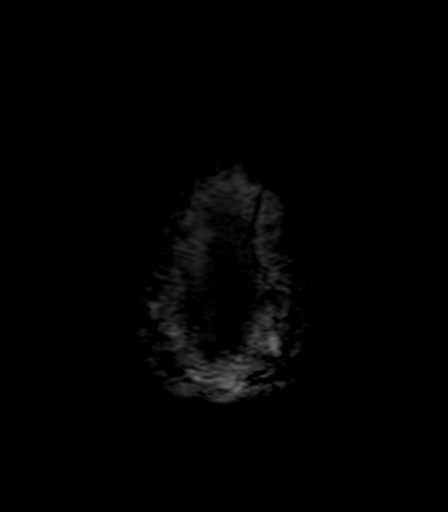

[Series 18: FLAIR · axial · 5.0mm · 1.25mm/px · z∈[-66,+85]mm · 2 of 28 slices shown]
[im 1/28]
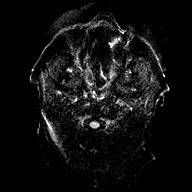
[im 28/28]
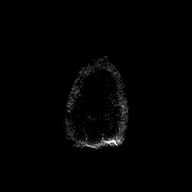

[Series 19: T1 · axial · 5.0mm · 0.94mm/px · z∈[-66,+86]mm · 2 of 28 slices shown (2 of 3)]
[im 1/28]
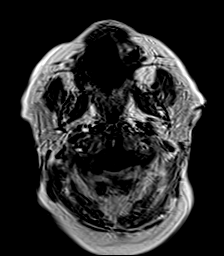
[im 28/28]
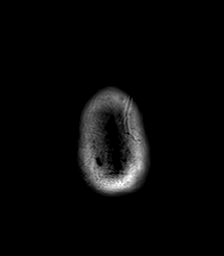

[Series 20: T2 · coronal · 5.0mm · 0.45mm/px · 3 of 35 slices shown (2 of 2)]
[im 1/35]
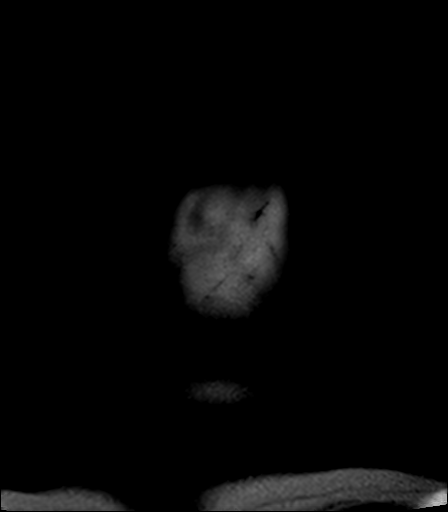
[im 18/35]
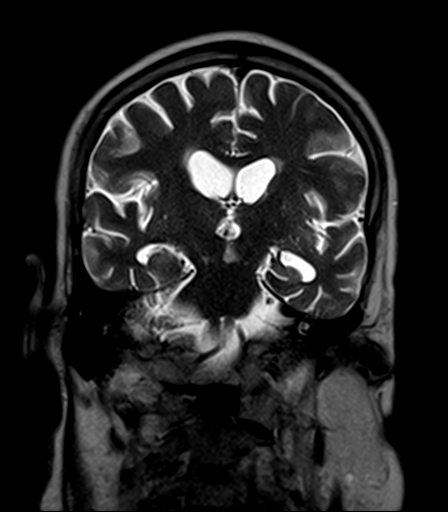
[im 35/35]
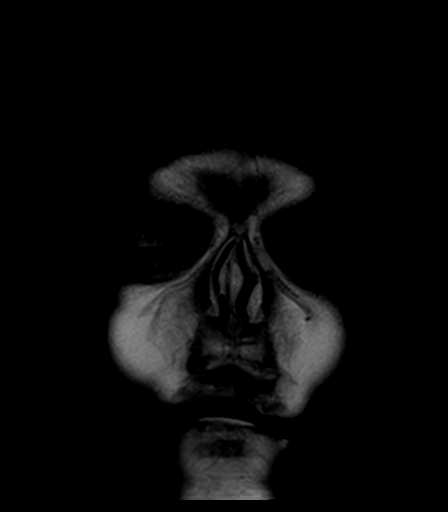

[Series 21: T1 · sagittal · 5.0mm · 0.94mm/px · 2 of 23 slices shown (3 of 3)]
[im 1/23]
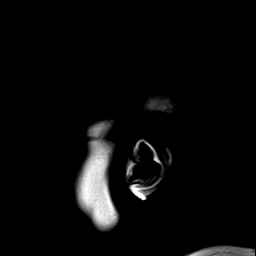
[im 23/23]
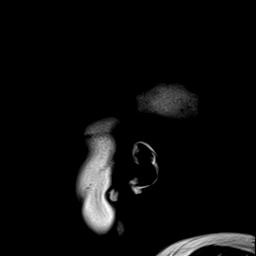

[Series 22: ax dwi_tracew · axial · 3.0mm · 1.36mm/px · z∈[-72,+79]mm · 4 of 50 slices shown (3 of 3)]
[im 1/50]
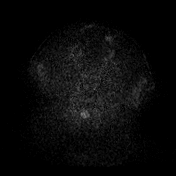
[im 17/50]
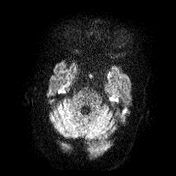
[im 33/50]
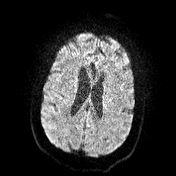
[im 50/50]
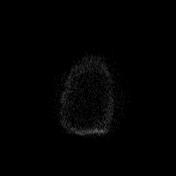

[Series 23: ax dwi_adc · axial · 3.0mm · 1.36mm/px · z∈[-72,+79]mm · 4 of 50 slices shown (3 of 3)]
[im 1/50]
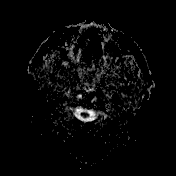
[im 17/50]
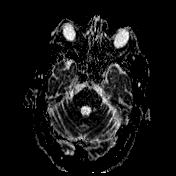
[im 33/50]
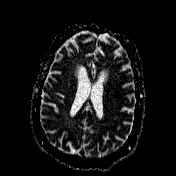
[im 50/50]
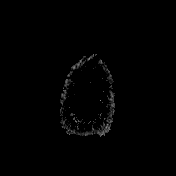

[48 of 48 positions shown; findings below may reference images not displayed]

FINDINGS: Brain: Diffusion imaging does not show any acute or subacute
infarction or other cause of restricted diffusion. No brainstem or
cerebellar abnormality is seen. Cerebral hemispheres show moderate
chronic small-vessel ischemic changes of the white matter. No large
vessel territory infarction. No mass lesion, hemorrhage,
hydrocephalus or extra-axial collection.

Vascular: Major vessels at the base of the brain show flow.

Skull and upper cervical spine: Negative

Sinuses/Orbits: Clear/normal

Other: None
IMPRESSION: No acute or reversible finding. Moderate chronic small-vessel
ischemic changes of the cerebral hemispheric white matter.

## 2020-06-01 ENCOUNTER — Encounter: Payer: Self-pay | Admitting: Nurse Practitioner

## 2020-06-01 ENCOUNTER — Non-Acute Institutional Stay: Payer: 59 | Admitting: Nurse Practitioner

## 2020-06-01 ENCOUNTER — Other Ambulatory Visit: Payer: Self-pay

## 2020-06-01 VITALS — BP 149/81 | HR 82 | Temp 97.1°F | Resp 18 | Wt 306.9 lb

## 2020-06-01 DIAGNOSIS — Z515 Encounter for palliative care: Secondary | ICD-10-CM

## 2020-06-01 DIAGNOSIS — I426 Alcoholic cardiomyopathy: Secondary | ICD-10-CM
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
## 2020-07-08 ENCOUNTER — Other Ambulatory Visit (HOSPITAL_COMMUNITY): Payer: Self-pay | Admitting: Physician Assistant

## 2020-08-08 ENCOUNTER — Ambulatory Visit: Payer: Medicare Other | Admitting: Student in an Organized Health Care Education/Training Program

## 2020-08-29 ENCOUNTER — Encounter: Payer: Self-pay | Admitting: Student in an Organized Health Care Education/Training Program

## 2020-08-29 ENCOUNTER — Ambulatory Visit
Payer: Medicare Other | Attending: Student in an Organized Health Care Education/Training Program | Admitting: Student in an Organized Health Care Education/Training Program

## 2020-08-29 ENCOUNTER — Other Ambulatory Visit: Payer: Self-pay

## 2020-08-29 VITALS — BP 105/74 | HR 98 | Temp 97.2°F | Resp 16 | Ht 68.0 in | Wt 317.0 lb

## 2020-08-29 DIAGNOSIS — M1712 Unilateral primary osteoarthritis, left knee: Secondary | ICD-10-CM | POA: Diagnosis present

## 2020-08-29 DIAGNOSIS — G8929 Other chronic pain: Secondary | ICD-10-CM | POA: Diagnosis present

## 2020-08-29 DIAGNOSIS — M25562 Pain in left knee: Secondary | ICD-10-CM | POA: Diagnosis present

## 2020-08-29 DIAGNOSIS — G894 Chronic pain syndrome: Secondary | ICD-10-CM | POA: Diagnosis present

## 2020-08-29 NOTE — Progress Notes (Signed)
PROVIDER NOTE: Information contained herein reflects review and annotations entered in association with encounter. Interpretation of such information and data should be left to medically-trained personnel. Information provided to patient can be located elsewhere in the medical record under "Patient Instructions". Document created using STT-dictation technology, any transcriptional errors that may result from process are unintentional.    Patient: Joshua Petersen  Service Category: E/M  Provider: Gillis Santa, MD  DOB: 10/08/1955  DOS: 08/29/2020  Specialty: Interventional Pain Management  MRN: [id number]  Setting: Ambulatory outpatient  PCP: [name], MD  Type: Established Patient    Referring Provider: [name], *  Location: Office  Delivery: Face-to-face     HPI  Joshua Petersen, a 65 y.o. year old male, is here today because of his Primary osteoarthritis of left knee [M17.12]. Joshua Petersen primary complain today is Back Pain (Lumbar bilateral ), Hip Pain (Right ), Shoulder Pain (Left s/p rotator cuff repair ), Knee Pain (Left ), and Foot Pain (Plantar fasciatis )  Pain Assessment: Severity of Chronic pain is reported as a 5 /10. Location: Back (see visit info for additional pain sites.) Lower,Left,Right/hard to tell if hip pain is coming from back or not.. Onset: More than a month ago. Quality: Discomfort,Constant (pain is scary). Timing: Constant. Modifying factor(s): not walking on his feet, medications. Vitals:  height is _0  (1.727 m) and weight is 317 lb (143.8 kg) (abnormal). His temporal temperature is 97.2 F (36.2 C) (abnormal). His blood pressure is 105/74 and his pulse is 98. His respiration is 16 and oxygen saturation is 97%.   Reason for encounter: worsening of previously known (established) problem    Joshua Petersen presents for worsening left knee pain related to severe knee osteoarthritis.  Of note, he has seen me in the past, greater than 2 years ago for right knee  pain related to right knee osteoarthritis for which he did a right genicular nerve block and right genicular nerve radiofrequency ablation which provided him significant pain relief for approximately a year and a half (75% pain relief).  He is now having increased left knee pain that is worse with weightbearing.  He has failed conservative therapy as well as physical therapy.  He is requesting left knee genicular nerve block and radiofrequency ablation since he received such good benefit from it for his right knee.  ROS  Constitutional: Denies any fever or chills Gastrointestinal: No reported hemesis, hematochezia, vomiting, or acute GI distress Musculoskeletal: left > right knee pain Neurological: No reported episodes of acute onset apraxia, aphasia, dysarthria, agnosia, amnesia, paralysis, loss of coordination, or loss of consciousness  Medication Review  Diclofenac Sodium, Lidocaine-Prilocaine &Lido HCl, Melatonin, acetaminophen, albuterol, aspirin, atorvastatin, carvedilol, cyclobenzaprine, esomeprazole, lidocaine, losartan, methadone, oxyCODONE, oxyCODONE ER, tamsulosin, and tiZANidine  History Review  Allergy: Joshua Petersen has No Known Allergies. Drug: Joshua Petersen  reports no history of drug use. Alcohol:  reports previous alcohol use. Tobacco:  reports that he has quit smoking. His smoking use included cigarettes and pipe. His smokeless tobacco use includes snuff. Social: Joshua Petersen  reports that he has quit smoking. His smoking use included cigarettes and pipe. His smokeless tobacco use includes snuff. He reports previous alcohol use. He reports that he does not use drugs. Medical:  has a past medical history of Arthritis, Back pain, CHF (congestive heart failure) ([place]), Chronic kidney disease, GERD (gastroesophageal reflux disease), H/O splenectomy, Hepatitis, High cholesterol, History of kidney stones, Hypertension, Peripheral vascular disease ([place]), and  Plantar fasciitis. Surgical: Joshua Petersen  has  a past surgical history that includes Hernia repair; Splenectomy; Joint replacement; Total hip arthroplasty (Left, 03/12/2017); Total knee arthroplasty (Right, 12/11/2018); and Reverse shoulder arthroplasty (Left, 12/10/2019). Family: family history includes Hypertension in his father.  Laboratory Chemistry Profile   Renal Lab Results  Component Value Date   BUN 22 12/11/2019   CREATININE 0.77 12/11/2019   BCR 24 07/14/2019   GFRAA >60 12/11/2019   GFRNONAA >60 12/11/2019     Hepatic Lab Results  Component Value Date   AST 14 (L) 12/08/2019   ALT 15 12/08/2019   ALBUMIN 3.8 12/08/2019   ALKPHOS 77 12/08/2019   LIPASE 49 04/23/2019   AMMONIA 38 (H) 04/30/2019     Electrolytes Lab Results  Component Value Date   NA 140 12/11/2019   K 3.9 12/11/2019   CL 103 12/11/2019   CALCIUM 8.9 12/11/2019   MG 1.7 04/28/2019   PHOS 4.6 04/30/2019     Bone No results found for: VD25OH, VD125OH2TOT, DJ5701XB9, TJ0300PQ3, 25OHVITD1, 25OHVITD2, 25OHVITD3, TESTOFREE, TESTOSTERONE   Inflammation (CRP: Acute Phase) (ESR: Chronic Phase) Lab Results  Component Value Date   ESRSEDRATE 10 12/08/2018   LATICACIDVEN 1.9 04/23/2019       Note: Above Lab results reviewed.  Recent Imaging Review  DG Shoulder Left Port CLINICAL DATA:  Postoperative left shoulder surgery  EXAM: LEFT SHOULDER  COMPARISON:  10/05/2019  FINDINGS: Interval postsurgical changes from reverse left total shoulder arthroplasty. Arthroplasty components appear in their expected alignment. No evidence of periprosthetic fracture or other complication. Moderately advanced arthropathy of the AC joint. Expected postoperative changes within the overlying soft tissues of the left shoulder.  IMPRESSION: Interval postsurgical changes from reverse left total shoulder arthroplasty without evidence of postoperative complication.  Electronically Signed   By: Davina Poke D.O.   On: 12/10/2019 14:38 [place] OR NERVE  BLOCK-IMAGE ONLY ([place]) There is no interpretation for this exam.    This order is for images obtained during a surgical procedure.  Please See  "Surgeries" Tab for more information regarding the procedure. Note: Reviewed        Physical Exam  General appearance: Well nourished, well developed, and well hydrated. In no apparent acute distress Mental status: Alert, oriented x 3 (person, place, & time)       Respiratory: No evidence of acute respiratory distress Eyes: PERLA Vitals: BP 105/74 (BP Location: Right Arm, Patient Position: Sitting, Cuff Size: Large)   Pulse 98   Temp (!) 97.2 F (36.2 C) (Temporal)   Resp 16   Ht _0  (1.727 m)   Wt (!) 317 lb (143.8 kg)   SpO2 97%   BMI 48.20 kg/m  BMI: Estimated body mass index is 48.2 kg/m as calculated from the following:   Height as of this encounter: _1  (1.727 m).   Weight as of this encounter: 317 lb (143.8 kg). Ideal: Ideal body weight: 68.4 kg (150 lb 12.7 oz) Adjusted ideal body weight: 98.6 kg (217 lb 4.4 oz)  Lumbar Spine Area Exam  Skin & Axial Inspection: No masses, redness, or swelling Alignment: Symmetrical Functional ROM: Pain restricted ROM       Stability: No instability detected Muscle Tone/Strength: Functionally intact. No obvious neuro-muscular anomalies detected. Sensory (Neurological): Musculoskeletal pain pattern and neurogenic  Gait & Posture Assessment  Ambulation: Patient came in today in a wheel chair Gait: Age-related, senile gait pattern Posture: Difficulty standing up straight, due to pain  Lower Extremity Exam  Side: Right lower extremity  Side: Left lower extremity  Stability: No instability observed          Stability: No instability observed          Skin & Extremity Inspection: Evidence of prior arthroplastic surgery  Skin & Extremity Inspection: Skin color, temperature, and hair growth are WNL. No peripheral edema or cyanosis. No masses, redness, swelling, asymmetry, or associated skin  lesions. No contractures.  Functional ROM: Pain restricted ROM for hip and knee joints          Functional ROM: Pain restricted ROM                  Muscle Tone/Strength: Functionally intact. No obvious neuro-muscular anomalies detected.  Muscle Tone/Strength: Functionally intact. No obvious neuro-muscular anomalies detected.  Sensory (Neurological): Arthropathic arthralgia        Sensory (Neurological): Arthropathic arthralgia        DTR: Patellar: 0: absent Achilles: deferred today Plantar: deferred today  DTR: Patellar: 0: absent Achilles: deferred today Plantar: deferred today  Palpation: No palpable anomalies  Palpation: No palpable anomalies    Assessment   Status Diagnosis  Having a Flare-up Having a Flare-up Having a Flare-up 1. Primary osteoarthritis of left knee   2. Chronic pain of left knee   3. Chronic pain syndrome      Updated Problems: Problem  Chronic Pain Syndrome  Primary Osteoarthritis of Left Knee  Chronic Pain of Left Knee    Plan of Care  1. Primary osteoarthritis of left knee - GENICULAR NERVE BLOCK; Future  2. Chronic pain of left knee - GENICULAR NERVE BLOCK; Future  3. Chronic pain syndrome - GENICULAR NERVE BLOCK; Future   Orders:  Orders Placed This Encounter  Procedures  . GENICULAR NERVE BLOCK    Indication(s):  Sub-acute knee pain    Standing Status:   Future    Standing Expiration Date:   11/28/2020    Scheduling Instructions:     Side:LEFT     Sedation: without     Timeframe: As soon as schedule allows    Order Specific Question:   Where will this procedure be performed?    Answer:   [facility]   Follow-up plan:   Return in about 16 days (around 09/14/2020) for Left knee GNB without sedation.    Recent Visits No visits were found meeting these conditions. Showing recent visits within past 90 days and meeting all other requirements Today's Visits Date Type Provider Dept  08/29/20 Office Visit Gillis Santa, MD  [facility]  Showing today's visits and meeting all other requirements Future Appointments Date Type Provider Dept  09/14/20 Appointment Gillis Santa, MD [facility]  Showing future appointments within next 90 days and meeting all other requirements  I discussed the assessment and treatment plan with the patient. The patient was provided an opportunity to ask questions and all were answered. The patient agreed with the plan and demonstrated an understanding of the instructions.  Patient advised to call back or seek an in-person evaluation if the symptoms or condition worsens.  Duration of encounter: 35mnutes.  Note by: BGillis Santa MD Date: 08/29/2020; Time: 2:38 PM

## 2020-08-29 NOTE — Progress Notes (Signed)
Safety precautions to be maintained throughout the outpatient stay will include: orient to surroundings, keep bed in low position, maintain call bell within reach at all times, provide assistance with transfer out of bed and ambulation.  

## 2020-08-29 NOTE — Patient Instructions (Signed)

## 2020-09-08 ENCOUNTER — Ambulatory Visit: Payer: Medicare Other | Admitting: Family
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
## 2020-09-14 ENCOUNTER — Encounter: Payer: Self-pay | Admitting: Student in an Organized Health Care Education/Training Program

## 2020-09-14 ENCOUNTER — Other Ambulatory Visit: Payer: Self-pay

## 2020-09-14 ENCOUNTER — Ambulatory Visit (HOSPITAL_BASED_OUTPATIENT_CLINIC_OR_DEPARTMENT_OTHER): Payer: Medicare Other | Admitting: Student in an Organized Health Care Education/Training Program

## 2020-09-14 ENCOUNTER — Ambulatory Visit
Admission: RE | Admit: 2020-09-14 | Discharge: 2020-09-14 | Disposition: A | Discharge status: Home / Self Care | Payer: Medicare Other | Source: Ambulatory Visit | Attending: Student in an Organized Health Care Education/Training Program | Admitting: Student in an Organized Health Care Education/Training Program

## 2020-09-14 VITALS — BP 142/96 | HR 76 | Temp 97.2°F | Resp 20 | Ht 68.0 in | Wt 315.0 lb

## 2020-09-14 DIAGNOSIS — M25562 Pain in left knee: Secondary | ICD-10-CM

## 2020-09-14 DIAGNOSIS — G894 Chronic pain syndrome: Secondary | ICD-10-CM | POA: Insufficient documentation

## 2020-09-14 DIAGNOSIS — M1712 Unilateral primary osteoarthritis, left knee: Secondary | ICD-10-CM | POA: Insufficient documentation

## 2020-09-14 DIAGNOSIS — G8929 Other chronic pain: Secondary | ICD-10-CM | POA: Diagnosis present

## 2020-09-14 MED ORDER — LIDOCAINE HCL 2 % IJ SOLN
INTRAMUSCULAR | Status: AC
  Filled 2020-09-14: qty 20

## 2020-09-14 MED ORDER — LIDOCAINE HCL 2 % IJ SOLN
20.0000 mL | Freq: Once | INTRAMUSCULAR | Status: AC
  Administered 2020-09-14: 400 mg

## 2020-09-14 MED ORDER — ROPIVACAINE HCL 2 MG/ML IJ SOLN
INTRAMUSCULAR | Status: AC
  Filled 2020-09-14: qty 10

## 2020-09-14 MED ORDER — DEXAMETHASONE SODIUM PHOSPHATE 10 MG/ML IJ SOLN
INTRAMUSCULAR | Status: AC
  Filled 2020-09-14: qty 1

## 2020-09-14 MED ORDER — DEXAMETHASONE SODIUM PHOSPHATE 10 MG/ML IJ SOLN
10.0000 mg | Freq: Once | INTRAMUSCULAR | Status: AC
  Administered 2020-09-14: 10 mg

## 2020-09-14 MED ORDER — ROPIVACAINE HCL 2 MG/ML IJ SOLN
9.0000 mL | Freq: Once | INTRAMUSCULAR | Status: AC
  Administered 2020-09-14: 9 mL via PERINEURAL

## 2020-09-14 NOTE — Progress Notes (Signed)
PROVIDER NOTE: Information contained herein reflects review and annotations entered in association with encounter. Interpretation of such information and data should be left to medically-trained personnel. Information provided to patient can be located elsewhere in the medical record under "Patient Instructions". Document created using STT-dictation technology, any transcriptional errors that may result from process are unintentional.    Patient: Joshua Petersen  Service Category: Procedure  Provider: Edward Jolly, MD  DOB: 1956/05/11  DOS: 09/14/2020  Location: [facility]  MRN: [id number]  Setting: Ambulatory - outpatient  Referring Provider: Dione Housekeeper, *  Type: Established Patient  Specialty: Interventional Pain Management  PCP: Dione Housekeeper, MD   Primary Reason for Visit: Interventional Pain Management Treatment. CC: Knee Pain (left)  Procedure:          Anesthesia, Analgesia, Anxiolysis:  Type: Genicular Nerves Block (Superolateral, Superomedial, and Inferomedial Genicular Nerves)          CPT: 64450      Primary Purpose: Diagnostic Region: Lateral, Anterior, and Medial aspects of the knee joint, above and below the knee joint proper. Level: Superior and inferior to the knee joint. Target Area: For Genicular Nerve block(s), the targets are: the superolateral genicular nerve, located in the lateral distal portion of the femoral shaft as it curves to form the lateral epicondyle, in the region of the distal femoral metaphysis; the superomedial genicular nerve, located in the medial distal portion of the femoral shaft as it curves to form the medial epicondyle; and the inferomedial genicular nerve, located in the medial, proximal portion of the tibial shaft, as it curves to form the medial epicondyle, in the region of the proximal tibial metaphysis. Approach: Anterior, percutaneous, ipsilateral approach. Laterality: Left knee  Type: Local Anesthesia  Local  Anesthetic: Lidocaine 1-2%  Position: Modified Fowler's position with pillows under the targeted knee(s).   Indications: 1. Primary osteoarthritis of left knee   2. Chronic pain of left knee   3. Chronic pain syndrome    Pain Score: Pre-procedure: 8 /10 Post-procedure: 8 /10   Pre-op H&P Assessment:  Mr. Brundrett is a 65 y.o. (year old), male patient, seen today for interventional treatment. He  has a past surgical history that includes Hernia repair; Splenectomy; Joint replacement; Total hip arthroplasty (Left, 03/12/2017); Total knee arthroplasty (Right, 12/11/2018); and Reverse shoulder arthroplasty (Left, 12/10/2019). Mr. Doto has a current medication list which includes the following prescription(s): albuterol, aspirin, atorvastatin, carvedilol, cyclobenzaprine, esomeprazole, losartan, melatonin, methadone, oxycodone, tamsulosin, tizanidine, acetaminophen, acetaminophen, diclofenac sodium, lidocaine, lidocaine-prilocaine &lido hcl, and xtampza er. His primarily concern today is the Knee Pain (left)  Initial Vital Signs:  Pulse/HCG Rate: 79  Temp: (!) 97.2 F (36.2 C) Resp: 18 BP: (!) 141/73 SpO2: 95 %  BMI: Estimated body mass index is 47.9 kg/m as calculated from the following:   Height as of this encounter: 5\' 8"  (1.727 m).   Weight as of this encounter: 315 lb (142.9 kg).  Risk Assessment: Allergies: Reviewed. He has No Known Allergies.  Allergy Precautions: None required Coagulopathies: Reviewed. None identified.  Blood-thinner therapy: None at this time Active Infection(s): Reviewed. None identified. Mr. Badenhop is afebrile  Site Confirmation: Mr. Penders was asked to confirm the procedure and laterality before marking the site Procedure checklist: Completed Consent: Before the procedure and under the influence of no sedative(s), amnesic(s), or anxiolytics, the patient was informed of the treatment options, risks and possible complications. To fulfill our ethical and legal  obligations, as recommended by the American  Medical Association's Code of Ethics, I have informed the patient of my clinical impression; the nature and purpose of the treatment or procedure; the risks, benefits, and possible complications of the intervention; the alternatives, including doing nothing; the risk(s) and benefit(s) of the alternative treatment(s) or procedure(s); and the risk(s) and benefit(s) of doing nothing. The patient was provided information about the general risks and possible complications associated with the procedure. These may include, but are not limited to: failure to achieve desired goals, infection, bleeding, organ or nerve damage, allergic reactions, paralysis, and death. In addition, the patient was informed of those risks and complications associated to the procedure, such as failure to decrease pain; infection; bleeding; organ or nerve damage with subsequent damage to sensory, motor, and/or autonomic systems, resulting in permanent pain, numbness, and/or weakness of one or several areas of the body; allergic reactions; (i.e.: anaphylactic reaction); and/or death. Furthermore, the patient was informed of those risks and complications associated with the medications. These include, but are not limited to: allergic reactions (i.e.: anaphylactic or anaphylactoid reaction(s)); adrenal axis suppression; blood sugar elevation that in diabetics may result in ketoacidosis or comma; water retention that in patients with history of congestive heart failure may result in shortness of breath, pulmonary edema, and decompensation with resultant heart failure; weight gain; swelling or edema; medication-induced neural toxicity; particulate matter embolism and blood vessel occlusion with resultant organ, and/or nervous system infarction; and/or aseptic necrosis of one or more joints. Finally, the patient was informed that Medicine is not an exact science; therefore, there is also the possibility of  unforeseen or unpredictable risks and/or possible complications that may result in a catastrophic outcome. The patient indicated having understood very clearly. We have given the patient no guarantees and we have made no promises. Enough time was given to the patient to ask questions, all of which were answered to the patient's satisfaction. Mr. Ezzell has indicated that he wanted to continue with the procedure. Attestation: I, the ordering provider, attest that I have discussed with the patient the benefits, risks, side-effects, alternatives, likelihood of achieving goals, and potential problems during recovery for the procedure that I have provided informed consent. Date  Time: 09/14/2020  9:46 AM  Pre-Procedure Preparation:  Monitoring: As per clinic protocol. Respiration, ETCO2, SpO2, BP, heart rate and rhythm monitor placed and checked for adequate function Safety Precautions: Patient was assessed for positional comfort and pressure points before starting the procedure. Time-out: I initiated and conducted the "Time-out" before starting the procedure, as per protocol. The patient was asked to participate by confirming the accuracy of the "Time Out" information. Verification of the correct person, site, and procedure were performed and confirmed by me, the nursing staff, and the patient. "Time-out" conducted as per Joint Commission's Universal Protocol ([email or web address].01.01.01). Time: 1015  Description of Procedure:          Area Prepped: Entire knee area, from mid-thigh to mid-shin, lateral, anterior, and medial aspects. DuraPrep (Iodine Povacrylex [0.7% available iodine] and Isopropyl Alcohol, 74% w/w) Safety Precautions: Aspiration looking for blood return was conducted prior to all injections. At no point did we inject any substances, as a needle was being advanced. No attempts were made at seeking any paresthesias. Safe injection practices and needle disposal techniques used. Medications properly checked for  expiration dates. SDV (single dose vial) medications used. Description of the Procedure: Protocol guidelines were followed. The patient was placed in position over the procedure table. The target area was identified and the area prepped in  the usual manner. Skin & deeper tissues infiltrated with local anesthetic. Appropriate amount of time allowed to pass for local anesthetics to take effect. The procedure needles were then advanced to the target area. Proper needle placement secured. Negative aspiration confirmed. Solution injected in intermittent fashion, asking for systemic symptoms every 0.5cc of injectate. The needles were then removed and the area cleansed, making sure to leave some of the prepping solution back to take advantage of its long term bactericidal properties.  Vitals:   09/14/20 0951 09/14/20 1015  BP: (!) 141/73 (!) 138/94  Pulse: 79 78  Resp: 18 16  Temp: (!) 97.2 F (36.2 C)   TempSrc: Temporal   SpO2: 95% 95%  Weight: (!) 315 lb (142.9 kg)   Height: 5\' 8"  (1.727 m)     Start Time: 1016 hrs. End Time: 1022 hrs. Materials:  Needle(s) Type: Spinal Needle Gauge: 22G Length: 3.5-in Medication(s): Please see orders for medications and dosing details. 9 cc solution made of 8 cc of 0.2% ropivacaine, 1 cc of Decadron 10 mg/cc.  3 cc injected at each level above for the left knee. Imaging Guidance (Non-Spinal):          Type of Imaging Technique: Fluoroscopy Guidance (Non-Spinal) Indication(s): Assistance in needle guidance and placement for procedures requiring needle placement in or near specific anatomical locations not easily accessible without such assistance. Exposure Time: Please see nurses notes. Contrast: None used. Fluoroscopic Guidance: I was personally present during the use of fluoroscopy. "Tunnel Vision Technique" used to obtain the best possible view of the target area. Parallax error corrected before commencing the procedure. "Direction-depth-direction"  technique used to introduce the needle under continuous pulsed fluoroscopy. Once target was reached, antero-posterior, oblique, and lateral fluoroscopic projection used confirm needle placement in all planes. Images permanently stored in EMR. Interpretation: No contrast injected. I personally interpreted the imaging intraoperatively. Adequate needle placement confirmed in multiple planes. Permanent images saved into the patient's record.  Post-operative Assessment:  Post-procedure Vital Signs:  Pulse/HCG Rate: 78 (nsr)  Temp: (!) 97.2 F (36.2 C) Resp: 16 BP: (!) 138/94 SpO2: 95 %  EBL: None  Complications: No immediate post-treatment complications observed by team, or reported by patient.  Note: The patient tolerated the entire procedure well. A repeat set of vitals were taken after the procedure and the patient was kept under observation following institutional policy, for this type of procedure. Post-procedural neurological assessment was performed, showing return to baseline, prior to discharge. The patient was provided with post-procedure discharge instructions, including a section on how to identify potential problems. Should any problems arise concerning this procedure, the patient was given instructions to immediately contact , at any time, without hesitation. In any case, we plan to contact the patient by telephone for a follow-up status report regarding this interventional procedure.  Comments:  No additional relevant information.  Plan of Care  Orders:  Orders Placed This Encounter  Procedures  . DG PAIN CLINIC C-ARM 1-60 MIN NO REPORT    Intraoperative interpretation by procedural physician at [facility].    Standing Status:   Standing    Number of Occurrences:   1    Order Specific Question:   Reason for exam:    Answer:   Assistance in needle guidance and placement for procedures requiring needle placement in or near specific anatomical locations not easily  accessible without such assistance.    Medications ordered for procedure: Meds ordered this encounter  Medications  . lidocaine (XYLOCAINE) 2 % (  with pres) injection 400 mg  . dexamethasone (DECADRON) injection 10 mg  . ropivacaine (PF) 2 mg/mL (0.2%) (NAROPIN) injection 9 mL   Medications administered: We administered lidocaine, dexamethasone, and ropivacaine (PF) 2 mg/mL (0.2%).  See the medical record for exact dosing, route, and time of administration.  Follow-up plan:   Return in about 4 weeks (around 10/12/2020) for Post Procedure Evaluation, virtual.     Recent Visits Date Type Provider Dept  08/29/20 Office Visit Edward Jolly, MD [facility]  Showing recent visits within past 90 days and meeting all other requirements Today's Visits Date Type Provider Dept  09/14/20 Procedure visit Edward Jolly, MD [facility]  Showing today's visits and meeting all other requirements Future Appointments No visits were found meeting these conditions. Showing future appointments within next 90 days and meeting all other requirements  Disposition: Discharge home  Discharge (Date  Time): 09/14/2020;   hrs.   Primary Care Physician: Dione Housekeeper, MD Location: [facility] Note by: Edward Jolly, MD Date: 09/14/2020; Time: 10:29 AM  Disclaimer:  Medicine is not an exact science. The only guarantee in medicine is that nothing is guaranteed. It is important to note that the decision to proceed with this intervention was based on the information collected from the patient. The Data and conclusions were drawn from the patient's questionnaire, the interview, and the physical examination. Because the information was provided in large part by the patient, it cannot be guaranteed that it has not been purposely or unconsciously manipulated. Every effort has been made to obtain as much relevant data as possible for this evaluation. It is important to  note that the conclusions that lead to this procedure are derived in large part from the available data. Always take into account that the treatment will also be dependent on availability of resources and existing treatment guidelines, considered by other Pain Management Practitioners as being common knowledge and practice, at the time of the intervention. For Medico-Legal purposes, it is also important to point out that variation in procedural techniques and pharmacological choices are the acceptable norm. The indications, contraindications, technique, and results of the above procedure should only be interpreted and judged by a Board-Certified Interventional Pain Specialist with extensive familiarity and expertise in the same exact procedure and technique.

## 2020-09-14 NOTE — Progress Notes (Signed)
Safety precautions to be maintained throughout the outpatient stay will include: orient to surroundings, keep bed in low position, maintain call bell within reach at all times, provide assistance with transfer out of bed and ambulation.  

## 2020-09-15 ENCOUNTER — Telehealth: Payer: Self-pay | Admitting: *Deleted

## 2020-09-15 ENCOUNTER — Ambulatory Visit (INDEPENDENT_AMBULATORY_CARE_PROVIDER_SITE_OTHER): Payer: Medicare Other | Admitting: Family

## 2020-09-15 ENCOUNTER — Other Ambulatory Visit: Payer: Self-pay

## 2020-09-15 ENCOUNTER — Encounter: Payer: Self-pay | Admitting: Family

## 2020-09-15 VITALS — BP 132/60 | HR 67 | Ht 71.0 in | Wt 311.2 lb

## 2020-09-15 DIAGNOSIS — I10 Essential (primary) hypertension: Secondary | ICD-10-CM | POA: Diagnosis not present

## 2020-09-15 DIAGNOSIS — E782 Mixed hyperlipidemia: Secondary | ICD-10-CM

## 2020-09-15 DIAGNOSIS — I5022 Chronic systolic (congestive) heart failure: Secondary | ICD-10-CM | POA: Diagnosis not present

## 2020-09-15 DIAGNOSIS — I428 Other cardiomyopathies: Secondary | ICD-10-CM | POA: Diagnosis not present
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
## 2020-09-15 NOTE — Telephone Encounter (Signed)
No problems post procedure. 

## 2020-09-15 NOTE — Patient Instructions (Addendum)
Medication Instructions:  Continue your current medications.   *If you need a refill on your cardiac medications before your next appointment, please call your pharmacy*  Lab Work: None ordered today. Your recent cholesterol panel looked good!  Testing/Procedures: Your EKG today shows normal sinus rhythm. This is a good result.   Follow-Up: At [facility], you and your health needs are our priority.  As part of our continuing mission to provide you with exceptional heart care, we have created designated Provider Care Teams.  These Care Teams include your primary Cardiologist (physician) and Advanced Practice Providers (APPs -  Physician Assistants and Nurse Practitioners) who all work together to provide you with the care you need, when you need it.  We recommend signing up for the patient portal called "MyChart".  Sign up information is provided on this After Visit Summary.  MyChart is used to connect with patients for Virtual Visits (Telemedicine).  Patients are able to view lab/test results, encounter notes, upcoming appointments, etc.  Non-urgent messages can be sent to your provider as well.   To learn more about what you can do with MyChart, go to [email or web address].    Your next appointment:   6 month(s)  The format for your next appointment:   In Person  Provider:   You may see Lorine Bears, MD or one of the following Advanced Practice Providers on your designated Care Team:    Nicolasa Ducking, NP  Eula Listen, PA-C  Marisue Ivan, PA-C  Cadence Fransico Michael, [place]  Other Instructions  Heart Healthy Diet Recommendations: A low-salt diet is recommended. Meats should be grilled, baked, or boiled. Avoid fried foods. Focus on lean protein sources like fish or chicken with vegetables and fruits. The American Heart Association is a Chief Technology Officer!  American Heart Association Diet and Lifeystyle Recommendations   Exercise recommendations: The American Heart Association  recommends 150 minutes of moderate intensity exercise weekly. Try 30 minutes of moderate intensity exercise 4-5 times per week. This could include walking, jogging, or swimming.

## 2020-10-17 ENCOUNTER — Telehealth: Payer: Medicare Other | Admitting: Student in an Organized Health Care Education/Training Program

## 2020-10-19 ENCOUNTER — Ambulatory Visit
Payer: Medicare Other | Attending: Student in an Organized Health Care Education/Training Program | Admitting: Student in an Organized Health Care Education/Training Program

## 2020-10-19 ENCOUNTER — Other Ambulatory Visit: Payer: Self-pay

## 2020-10-19 ENCOUNTER — Encounter: Payer: Self-pay | Admitting: Student in an Organized Health Care Education/Training Program

## 2020-10-19 DIAGNOSIS — G8929 Other chronic pain: Secondary | ICD-10-CM

## 2020-10-19 DIAGNOSIS — M1712 Unilateral primary osteoarthritis, left knee: Secondary | ICD-10-CM | POA: Diagnosis not present

## 2020-10-19 DIAGNOSIS — M25562 Pain in left knee: Secondary | ICD-10-CM | POA: Diagnosis not present

## 2020-10-19 DIAGNOSIS — G894 Chronic pain syndrome: Secondary | ICD-10-CM

## 2020-10-19 NOTE — Progress Notes (Signed)
Patient: Joshua Petersen  Service Category: E/M  Provider: Gillis Santa, MD  DOB: [birth year]  DOS: 10/19/2020  Location: Office  MRN: [id number]  Setting: Ambulatory outpatient  Referring Provider: [name], *  Type: Established Patient  Specialty: Interventional Pain Management  PCP: [name], MD  Location: Home  Delivery: TeleHealth     Virtual Encounter - Pain Management PROVIDER NOTE: Information contained herein reflects review and annotations entered in association with encounter. Interpretation of such information and data should be left to medically-trained personnel. Information provided to patient can be located elsewhere in the medical record under "Patient Instructions". Document created using STT-dictation technology, any transcriptional errors that may result from process are unintentional.    Contact & Pharmacy Preferred: [phone] Home: [phone] (home) Mobile: [phone] (mobile) E-mail: No e-mail address on record  [place], [place] [address] Phone: [phone] Fax: [phone]  [facility] [address], [place] - [place] [address] Phone: [phone] Fax: [place]  [place] [address] Phone: [phone] Fax: [phone]   Pre-screening  Mr. Brainerd offered "in-person" vs "virtual" encounter. He indicated preferring virtual for this encounter.   Reason COVID-19*  Social distancing based on CDC and AMA recommendations.   I contacted Joshua Petersen on 10/19/2020 via video conference.      I clearly identified myself as Gillis Santa, MD. I verified that I was speaking with the correct person using two identifiers (Name: Joshua Petersen, and date of birth: 12-03-1955).  Consent I sought verbal advanced consent from Joshua Petersen for virtual visit interactions. I informed Joshua Petersen of  possible security and privacy concerns, risks, and limitations associated with providing "not-in-person" medical evaluation and management services. I also informed Mr. Shin of the availability of "in-person" appointments. Finally, I informed him that there would be a charge for the virtual visit and that he could be  personally, fully or partially, financially responsible for it. Joshua Petersen expressed understanding and agreed to proceed.   Historic Elements   Joshua Petersen is a 65 y.o. year old, male patient evaluated today after our last contact on 09/14/2020. Joshua Petersen  has a past medical history of Arthritis, Back pain, CHF (congestive heart failure) ([facility]), Chronic kidney disease, GERD (gastroesophageal reflux disease), H/O splenectomy, Hepatitis, High cholesterol, History of kidney stones, Hypertension, Peripheral vascular disease ([place]), and Plantar fasciitis. He also  has a past surgical history that includes Hernia repair; Splenectomy; Joint replacement; Total hip arthroplasty (Left, 03/12/2017); Total knee arthroplasty (Right, 12/11/2018); and Reverse shoulder arthroplasty (Left, 12/10/2019). [name] has a current medication list which includes the following prescription(s): acetaminophen, acetaminophen, albuterol, aspirin, atorvastatin, carvedilol, cyclobenzaprine, diclofenac sodium, esomeprazole, lidocaine, lidocaine-prilocaine &lido hcl, losartan, melatonin, methadone, oxycodone, xtampza er, tamsulosin, and tizanidine. He  reports that he has quit smoking. His smoking use included cigarettes and pipe. His smokeless tobacco use includes snuff. He reports previous alcohol use. He reports that he does not use drugs. Joshua Petersen has No Known Allergies.   HPI  Today, he is being contacted for a post-procedure assessment.   Post-Procedure Evaluation  Procedure (09/14/2020):   Type: Genicular Nerves Block (Superolateral, Superomedial, and Inferomedial Genicular Nerves)          CPT: [id number]      Primary  Purpose: Diagnostic Region: Lateral, Anterior, and Medial aspects  of the knee joint, above and below the knee joint proper. Level: Superior and inferior to the knee joint. Target Area: For Genicular Nerve block(s), the targets are: the superolateral genicular nerve, located in the lateral distal portion of the femoral shaft as it curves to form the lateral epicondyle, in the region of the distal femoral metaphysis; the superomedial genicular nerve, located in the medial distal portion of the femoral shaft as it curves to form the medial epicondyle; and the inferomedial genicular nerve, located in the medial, proximal portion of the tibial shaft, as it curves to form the medial epicondyle, in the region of the proximal tibial metaphysis. Approach: Anterior, percutaneous, ipsilateral approach. Laterality: Left knee  Sedation: Please see nurses note.  Effectiveness during initial hour after procedure(Ultra-Short Term Relief):100%  Local anesthetic used: Long-acting (4-6 hours) Effectiveness: Defined as any analgesic benefit obtained secondary to the administration of local anesthetics. This carries significant diagnostic value as to the etiological location, or anatomical origin, of the pain. Duration of benefit is expected to coincide with the duration of the local anesthetic used.  Effectiveness during initial 4-6 hours after procedure(Short-Term Relief):100%  Long-term benefit: Defined as any relief past the pharmacologic duration of the local anesthetics.  Effectiveness past the initial 6 hours after procedure(Long-Term Relief):60%  Current benefits: Defined as benefit that persist at this time.   Analgesia:  50% improved Function: Somewhat improved  Laboratory Chemistry Profile   Renal Lab Results  Component Value Date   BUN 22 12/11/2019   CREATININE 0.77 12/11/2019   BCR 24 07/14/2019   GFRAA >60 12/11/2019   GFRNONAA >60 12/11/2019     Hepatic Lab Results  Component Value Date    AST 14 (L) 12/08/2019   ALT 15 12/08/2019   ALBUMIN 3.8 12/08/2019   ALKPHOS 77 12/08/2019   LIPASE 49 04/23/2019   AMMONIA 38 (H) 04/30/2019     Electrolytes Lab Results  Component Value Date   NA 140 12/11/2019   K 3.9 12/11/2019   CL 103 12/11/2019   CALCIUM 8.9 12/11/2019   MG 1.7 04/28/2019   PHOS 4.6 04/30/2019     Bone No results found for: VD25OH, VD125OH2TOT, HQ4696EX5, MW4132GM0, 25OHVITD1, 25OHVITD2, 25OHVITD3, TESTOFREE, TESTOSTERONE   Inflammation (CRP: Acute Phase) (ESR: Chronic Phase) Lab Results  Component Value Date   ESRSEDRATE 10 12/08/2018   LATICACIDVEN 1.9 04/23/2019       Note: Above Lab results reviewed.  Assessment  The primary encounter diagnosis was Primary osteoarthritis of left knee. Diagnoses of Chronic pain of left knee and Chronic pain syndrome were also pertinent to this visit.  Plan of Care   Status post satisfactory left knee genicular RFA.  Encourage patient to continue with home physical therapy exercises.  Follow-up with [facility] which she sees regularly for medication management.  Patient can call back to repeat genicular nerve RFA after 6 months if he has return of left knee pain ablation levels.  Patient endorsed understanding.   Follow-up plan:   No follow-ups on file.   Recent Visits Date Type Provider Dept  09/14/20 Procedure visit Gillis Santa, MD [facility]  08/29/20 Office Visit Gillis Santa, MD [facility]  Showing recent visits within past 90 days and meeting all other requirements Today's Visits Date Type Provider Dept  10/19/20 Appointment Gillis Santa, MD [facility]  Showing today's visits and meeting all other requirements Future Appointments No visits were found meeting these conditions. Showing future appointments within next 90  days and meeting all other requirements  I discussed the assessment and treatment plan with the patient. The patient was provided an  opportunity to ask questions and all were answered. The patient agreed with the plan and demonstrated an understanding of the instructions.  Patient advised to call back or seek an in-person evaluation if the symptoms or condition worsens.  Duration of encounter: 20 minutes.  Note by: Gillis Santa, MD Date: 10/19/2020; Time: 1:24 PM

## 2020-12-29 ENCOUNTER — Other Ambulatory Visit: Payer: Self-pay

## 2020-12-29 ENCOUNTER — Non-Acute Institutional Stay: Payer: 59 | Admitting: Nurse Practitioner

## 2020-12-29 ENCOUNTER — Encounter: Payer: Self-pay | Admitting: Nurse Practitioner

## 2020-12-29 VITALS — BP 164/71 | HR 78 | Temp 98.1°F | Resp 18 | Wt 317.0 lb

## 2020-12-29 DIAGNOSIS — R0602 Shortness of breath: Secondary | ICD-10-CM

## 2020-12-29 DIAGNOSIS — Z515 Encounter for palliative care: Secondary | ICD-10-CM

## 2020-12-29 DIAGNOSIS — R5381 Other malaise: Secondary | ICD-10-CM
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
## 2021-03-02 ENCOUNTER — Telehealth: Payer: Self-pay | Admitting: Physician Assistant

## 2021-03-02 NOTE — Telephone Encounter (Signed)
Pt c/o of Chest Pain: STAT if CP now or developed within 24 hours  1. Are you having CP right now? Yes   2. Are you experiencing any other symptoms (ex. SOB, nausea, vomiting, sweating)? Nauseated last week sweating but not unusual  L side above rib cage towards arm  3. How long have you been experiencing CP? Since 10 minutes ago   4. Is your CP continuous or coming and going? Just started   5. Have you taken Nitroglycerin? No   Patient reports facility is careless with meds and changes the times he gets meds  ? Patient scheduled tomorrow with Michaelle Birks at 330

## 2021-03-02 NOTE — Telephone Encounter (Signed)
Incoming triage call received. Patient called to report the onset of chest pain that started 15-20 min ago while he was sitting and playing cards. He denies n/v, sob, diaphoresis. The pain is located under his left breast bone. He describes the pain as a stabbing sensation. Patients sts that the pain has improved during our phone call.  I asked if he has done anything that may have helped relieve the discomfort. He sts that he sat straight up in his chair.  He has an appt scheduled for tomorrow 03/03/21 @ 3:30 pm with JV. Adv the patient that he should keep the appt. If cardiac symptoms develop in the interim he is to seek emergency care.   Patient agreeable with the plan and voiced appreciation for the call.

## 2021-03-03 ENCOUNTER — Ambulatory Visit (INDEPENDENT_AMBULATORY_CARE_PROVIDER_SITE_OTHER): Payer: Medicare Other | Admitting: Physician Assistant

## 2021-03-03 ENCOUNTER — Other Ambulatory Visit: Payer: Self-pay

## 2021-03-03 ENCOUNTER — Encounter: Payer: Self-pay | Admitting: Physician Assistant

## 2021-03-03 VITALS — BP 150/74 | HR 79 | Ht 71.0 in | Wt 323.1 lb

## 2021-03-03 DIAGNOSIS — I10 Essential (primary) hypertension: Secondary | ICD-10-CM

## 2021-03-03 DIAGNOSIS — I5022 Chronic systolic (congestive) heart failure: Secondary | ICD-10-CM

## 2021-03-03 DIAGNOSIS — I428 Other cardiomyopathies: Secondary | ICD-10-CM

## 2021-03-03 DIAGNOSIS — I5033 Acute on chronic diastolic (congestive) heart failure: Secondary | ICD-10-CM

## 2021-03-03 DIAGNOSIS — E782 Mixed hyperlipidemia: Secondary | ICD-10-CM

## 2021-03-03 DIAGNOSIS — Z79899 Other long term (current) drug therapy: Secondary | ICD-10-CM

## 2021-03-03 DIAGNOSIS — R0600 Dyspnea, unspecified: Secondary | ICD-10-CM

## 2021-03-03 DIAGNOSIS — I5020 Unspecified systolic (congestive) heart failure: Secondary | ICD-10-CM

## 2021-03-03 MED ORDER — SPIRONOLACTONE 25 MG PO TABS
25.0000 mg | ORAL_TABLET | Freq: Every day | ORAL | 6 refills | Status: DC
Start: 2021-03-03 — End: 2022-11-22

## 2021-03-03 MED ORDER — CARVEDILOL 6.25 MG PO TABS
6.2500 mg | ORAL_TABLET | Freq: Two times a day (BID) | ORAL | 6 refills | Status: DC
Start: 2021-03-03 — End: 2022-04-02
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
## 2021-03-03 NOTE — Patient Instructions (Addendum)
Medication Instructions:  - Your physician has recommended you make the following change in your medication:   1) START spironolactone 25 mg- take 1 tablet by mouth once daily   2) INCREASE carvedilol to 6.25 mg- take 1 tablet by mouth twice daily   *If you need a refill on your cardiac medications before your next appointment, please call your pharmacy*   Lab Work: - Your physician recommends that you have lab work the same day of your echocardiogram: BNP/ BMP   If you have labs (blood work) drawn today and your tests are completely normal, you will receive your results only by: MyChart Message (if you have MyChart) OR A paper copy in the mail If you have any lab test that is abnormal or we need to change your treatment, we will call you to review the results.   Testing/Procedures: - Your physician has requested that you have an echocardiogram. Echocardiography is a painless test that uses sound waves to create images of your heart. It provides your doctor with information about the size and shape of your heart and how well your heart's chambers and valves are working. This procedure takes approximately one hour. There are no restrictions for this procedure. There is a possibility that an IV may need to be started during your test to inject an image enhancing agent. This is done to obtain more optimal pictures of your heart. Therefore we ask that you do at least drink some water prior to coming in to hydrate your veins.     Follow-Up: At [facility], you and your health needs are our priority.  As part of our continuing mission to provide you with exceptional heart care, we have created designated Provider Care Teams.  These Care Teams include your primary Cardiologist (physician) and Advanced Practice Providers (APPs -  Physician Assistants and Nurse Practitioners) who all work together to provide you with the care you need, when you need it.  We recommend signing up for the patient  portal called "MyChart".  Sign up information is provided on this After Visit Summary.  MyChart is used to connect with patients for Virtual Visits (Telemedicine).  Patients are able to view lab/test results, encounter notes, upcoming appointments, etc.  Non-urgent messages can be sent to your provider as well.   To learn more about what you can do with MyChart, go to [email or web address].    Your next appointment:   3-4 week(s)/ After the echo is completed  The format for your next appointment:   In Person  Provider:   You may see Lorine Bears, MD or one of the following Advanced Practice Providers on your designated Care Team:   Nicolasa Ducking, NP Eula Listen, PA-C Marisue Ivan, PA-C Cadence Fransico [place], [place]   Other Instructions  Spironolactone Tablets What is this medication? SPIRONOLACTONE (speer on oh LAK tone) treats high blood pressure and heart failure. It may also be used to reduce swelling related to heart, kidney, or liver disease. It helps your kidneys remove more fluid and salt from your blood through the urine without losing too much potassium. It belongs to a group of medications called diuretics. This medicine may be used for other purposes; ask your health care provider or pharmacist if you have questions. COMMON BRAND NAME(S): Aldactone What should I tell my care team before I take this medication? They need to know if you have any of these conditions: Addison's disease or low adrenal gland function High blood level of potassium Kidney disease  Liver disease An unusual or allergic reaction to spironolactone, other medications, foods, dyes, or preservatives Pregnant or trying to get pregnant Breast-feeding How should I use this medication? Take this medication by mouth. Take it as directed on the prescription label at the same time every day. You can take it with or without food. You should always take it the same way. Keep taking it unless your care team tells  you to stop. Talk to your care team about the use of this medication in children. Special care may be needed. Overdosage: If you think you have taken too much of this medicine contact a poison control center or emergency room at once. NOTE: This medicine is only for you. Do not share this medicine with others. What if I miss a dose? If you miss a dose, take it as soon as you can. If it is almost time for your next dose, take only that dose. Do not take double or extra doses. What may interact with this medication? Do not take this medication with any of the following: Cidofovir Eplerenone Tranylcypromine This medication may also interact with the following: Aspirin Certain medications for blood pressure or heart disease like benazepril, lisinopril, losartan, valsartan Certain medications that treat or prevent blood clots like heparin and enoxaparin Cholestyramine Cyclosporine Digoxin Lithium Medications that relax muscles for surgery NSAIDs, medications for pain and inflammation, like ibuprofen or naproxen Other diuretics Potassium salts or supplements Steroid medications like prednisone or cortisone Trimethoprim This list may not describe all possible interactions. Give your health care provider a list of all the medicines, herbs, non-prescription drugs, or dietary supplements you use. Also tell them if you smoke, drink alcohol, or use illegal drugs. Some items may interact with your medicine. What should I watch for while using this medication? Visit your care team for regular checks on your progress. Check your blood pressure as directed. Ask your care team what your blood pressure should be. Also, find out when you should contact him or her. Do not treat yourself for coughs, colds, or pain while you are using this medication without asking your care team for advice. Some medications may increase your blood pressure. Check with your care team if you have severe diarrhea, nausea, and  vomiting, or if you sweat a lot. The loss of too much body fluid may make it dangerous for you to take this medication. You may need to be on a special diet while taking this medication. Ask your care team. Also, find out how many glasses of fluid you need to drink each day. You may get drowsy or dizzy. Do not drive, use machinery, or do anything that needs mental alertness until you know how this medication affects you. Do not stand or sit up quickly, especially if you are an older patient. This reduces the risk of dizzy or fainting spells. Alcohol may interfere with the effects of this medication. Avoid alcoholic drinks. Avoid salt substitutes unless you are told otherwise by your care team. What side effects may I notice from receiving this medication? Side effects that you should report to your care team as soon as possible: Allergic reactions-skin rash, itching, hives, swelling of the face, lips, tongue, or throat Dehydration-increased thirst, dry mouth, feeling faint or lightheaded, headache, dark yellow or brown urine High potassium level-muscle weakness, fast or irregular heartbeat Kidney injury-decrease in the amount of urine, swelling of the ankles, hands, or feet Low blood pressure-dizziness, feeling faint or lightheaded, blurry vision Low sodium level-muscle weakness,  fatigue, dizziness, headache, confusion Side effects that usually do not require medical attention (report to your care team if they continue or are bothersome): Breast pain or tenderness Changes in sex drive or performance Dizziness Headache Irregular menstrual cycles or spotting Unexpected breast tissue growth This list may not describe all possible side effects. Call your doctor for medical advice about side effects. You may report side effects to FDA at 1-800-FDA-1088. Where should I keep my medication? Keep out of the reach of children and pets. Store below 25 degrees C (77 degrees F). Get rid of any unused  medication after the expiration date. To get rid of medications that are no longer needed or have expired: Take the medication to a medication take-back program. Check with your pharmacy or law enforcement to find a location. If you cannot return the medication, check the label or package insert to see if the medication should be thrown out in the garbage or flushed down the toilet. If you are not sure, ask your care team. If it is safe to put into the trash, take the medication out of the container. Mix the medication with cat litter, dirt, coffee grounds, or other unwanted substance. Seal the mixture in a bag or container. Put it in the trash. NOTE: This sheet is a summary. It may not cover all possible information. If you have questions about this medicine, talk to your doctor, pharmacist, or health care provider.  2022 Elsevier/Gold Standard (2020-07-28 13:01:04)

## 2021-03-06 ENCOUNTER — Encounter: Payer: Self-pay | Admitting: Physician Assistant

## 2021-03-14 ENCOUNTER — Telehealth: Payer: Self-pay | Admitting: Internal Medicine

## 2021-03-14 NOTE — Telephone Encounter (Signed)
Reviewed the patient's chart- he sees Dr. Kirke Corin.   I called and spoke with the patient. He advised that he forgot to tell us at his last appointment on 03/03/21 with Marisue Ivan, PA, that he is drinking a 1/2 a pot of coffee a day and has been doing this for some time.   He inquired if this could be contributing to any pain in the chest he might be feeling.  I advised if he is drinking caffenated coffee, this can contribute to heart palpitations/ tachycardia.  However, coffee can also contribute to acid reflux. I have advised the patient to decrease his coffee consumption to see if the pain in his chest improves. He confirms he has had acid reflux issues for 20+ years.   The patient voices understanding- he advised he will switch to decaf first, then try to cut back. He was very appreciative for the call back.

## 2021-03-14 NOTE — Telephone Encounter (Signed)
Patient wanted to talk to Dr. Graciela Husbands or his Nurse

## 2021-03-21 ENCOUNTER — Other Ambulatory Visit: Payer: Self-pay

## 2021-03-21 ENCOUNTER — Ambulatory Visit (INDEPENDENT_AMBULATORY_CARE_PROVIDER_SITE_OTHER): Payer: Medicare Other

## 2021-03-21 ENCOUNTER — Other Ambulatory Visit (INDEPENDENT_AMBULATORY_CARE_PROVIDER_SITE_OTHER): Payer: Medicare Other

## 2021-03-21 DIAGNOSIS — Z79899 Other long term (current) drug therapy: Secondary | ICD-10-CM

## 2021-03-21 DIAGNOSIS — R0600 Dyspnea, unspecified: Secondary | ICD-10-CM | POA: Diagnosis not present

## 2021-03-21 LAB — ECHOCARDIOGRAM COMPLETE
AV Mean grad: 2 mmHg
AV Peak grad: 4.4 mmHg
Ao pk vel: 1.05 m/s
Area-P 1/2: 2.17 cm2

## 2021-03-22 LAB — BASIC METABOLIC PANEL
BUN/Creatinine Ratio: 13 (ref 10–24)
BUN: 13 mg/dL (ref 8–27)
CO2: 29 mmol/L (ref 20–29)
Calcium: 9.6 mg/dL (ref 8.6–10.2)
Chloride: 96 mmol/L (ref 96–106)
Creatinine, Ser: 1 mg/dL (ref 0.76–1.27)
Glucose: 101 mg/dL — ABNORMAL HIGH (ref 70–99)
Potassium: 5 mmol/L (ref 3.5–5.2)
Sodium: 139 mmol/L (ref 134–144)
eGFR: 83 mL/min/{1.73_m2} (ref >59)

## 2021-03-22 LAB — BRAIN NATRIURETIC PEPTIDE: BNP: 8.2 pg/mL (ref 0.0–100.0)

## 2021-03-24 ENCOUNTER — Encounter: Payer: Self-pay | Admitting: Medical

## 2021-03-24 ENCOUNTER — Other Ambulatory Visit: Payer: Self-pay

## 2021-03-24 ENCOUNTER — Ambulatory Visit (INDEPENDENT_AMBULATORY_CARE_PROVIDER_SITE_OTHER): Payer: Medicare Other | Admitting: Medical

## 2021-03-24 VITALS — BP 112/60 | HR 91 | Ht 71.0 in | Wt 323.0 lb

## 2021-03-24 DIAGNOSIS — I10 Essential (primary) hypertension: Secondary | ICD-10-CM | POA: Diagnosis not present

## 2021-03-24 DIAGNOSIS — E782 Mixed hyperlipidemia: Secondary | ICD-10-CM

## 2021-03-24 DIAGNOSIS — I5022 Chronic systolic (congestive) heart failure: Secondary | ICD-10-CM

## 2021-03-24 DIAGNOSIS — G894 Chronic pain syndrome: Secondary | ICD-10-CM
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
## 2021-03-24 NOTE — Patient Instructions (Signed)
Medication Instructions:  Please continue your current medications  *If you need a refill on your cardiac medications before your next appointment, please call your pharmacy*   Lab Work: None  Testing/Procedures: None  Follow-Up: At [facility], you and your health needs are our priority.  As part of our continuing mission to provide you with exceptional heart care, we have created designated Provider Care Teams.  These Care Teams include your primary Cardiologist (physician) and Advanced Practice Providers (APPs -  Physician Assistants and Nurse Practitioners) who all work together to provide you with the care you need, when you need it.  We recommend signing up for the patient portal called "MyChart".  Sign up information is provided on this After Visit Summary.  MyChart is used to connect with patients for Virtual Visits (Telemedicine).  Patients are able to view lab/test results, encounter notes, upcoming appointments, etc.  Non-urgent messages can be sent to your provider as well.   To learn more about what you can do with MyChart, go to [email or web address].    Your next appointment:   3 month(s)  The format for your next appointment:   In Person  Provider:   You may see Lorine Bears, MD or one of the following Advanced Practice Providers on your designated Care Team:    Cadence [place], [place]

## 2021-04-25 ENCOUNTER — Other Ambulatory Visit: Payer: Self-pay

## 2021-04-25 DIAGNOSIS — M545 Low back pain, unspecified: Secondary | ICD-10-CM

## 2021-05-11 ENCOUNTER — Other Ambulatory Visit: Payer: Self-pay

## 2021-05-11 ENCOUNTER — Ambulatory Visit
Admission: RE | Admit: 2021-05-11 | Discharge: 2021-05-11 | Disposition: A | Discharge status: Home / Self Care | Payer: Medicare Other | Source: Ambulatory Visit

## 2021-05-11 DIAGNOSIS — M545 Low back pain, unspecified: Secondary | ICD-10-CM | POA: Insufficient documentation

## 2021-06-02 ENCOUNTER — Telehealth: Payer: Self-pay | Admitting: Cardiovascular Disease

## 2021-06-02 NOTE — Telephone Encounter (Signed)
Patient is requesting to go over his MRI results with Dr. Jari Sportsman nurse if possible. He also plans to contact the office of ordering PA [place], [place]) for review.

## 2021-06-02 NOTE — Telephone Encounter (Signed)
Returned the patients call. Advised him the the MRI lumbar and spine is a non-cardiac test. Recommended that the patient f/u with the ordering provider or his pcp to review.  Patient verbalized understanding and voiced appreciation for the call back.
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
## 2021-06-30 ENCOUNTER — Ambulatory Visit: Payer: Medicare Other | Admitting: Medical

## 2021-07-27 ENCOUNTER — Ambulatory Visit: Payer: Medicare Other | Admitting: Medical
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
## 2021-08-14 ENCOUNTER — Encounter: Payer: Self-pay | Admitting: Physician Assistant

## 2021-08-14 ENCOUNTER — Ambulatory Visit (INDEPENDENT_AMBULATORY_CARE_PROVIDER_SITE_OTHER): Payer: Medicare Other | Admitting: Physician Assistant

## 2021-08-14 VITALS — BP 110/70 | HR 83 | Ht 68.0 in | Wt 290.0 lb

## 2021-08-14 DIAGNOSIS — I10 Essential (primary) hypertension: Secondary | ICD-10-CM | POA: Diagnosis not present

## 2021-08-14 DIAGNOSIS — I428 Other cardiomyopathies: Secondary | ICD-10-CM | POA: Diagnosis not present

## 2021-08-14 DIAGNOSIS — G8929 Other chronic pain: Secondary | ICD-10-CM

## 2021-08-14 DIAGNOSIS — E782 Mixed hyperlipidemia: Secondary | ICD-10-CM

## 2021-08-14 DIAGNOSIS — I5022 Chronic systolic (congestive) heart failure: Secondary | ICD-10-CM | POA: Diagnosis not present

## 2021-08-14 NOTE — Patient Instructions (Signed)
Medication Instructions:  ?No changes at this time.  ? ?*If you need a refill on your cardiac medications before your next appointment, please call your pharmacy* ? ? ?Lab Work: ?CBC, CMET, Lipid, Direct LDL ? ?If you have labs (blood work) drawn today and your tests are completely normal, you will receive your results only by: ?MyChart Message (if you have MyChart) OR ?A paper copy in the mail ?If you have any lab test that is abnormal or we need to change your treatment, we will call you to review the results. ? ? ?Testing/Procedures: ?None  ? ? ?Follow-Up: ?At [facility], you and your health needs are our priority.  As part of our continuing mission to provide you with exceptional heart care, we have created designated Provider Care Teams.  These Care Teams include your primary Cardiologist (physician) and Advanced Practice Providers (APPs -  Physician Assistants and Nurse Practitioners) who all work together to provide you with the care you need, when you need it. ? ? ?Your next appointment:   ?6 month(s) ? ?The format for your next appointment:   ?In Person ? ?Provider:   ?Lorine Bears, MD or Eula Listen, PA-C ?

## 2021-08-15 ENCOUNTER — Encounter: Payer: Self-pay | Admitting: *Deleted

## 2021-08-15 LAB — LDL CHOLESTEROL, DIRECT: LDL Direct: 113 mg/dL — ABNORMAL HIGH (ref 0–99)

## 2021-08-15 LAB — LIPID PANEL
Chol/HDL Ratio: 4.6 ratio (ref 0.0–5.0)
Cholesterol, Total: 172 mg/dL (ref 100–199)
HDL: 37 mg/dL — ABNORMAL LOW (ref >39)
LDL Chol Calc (NIH): 110 mg/dL — ABNORMAL HIGH (ref 0–99)
Triglycerides: 140 mg/dL (ref 0–149)
VLDL Cholesterol Cal: 25 mg/dL (ref 5–40)

## 2021-08-15 LAB — COMPREHENSIVE METABOLIC PANEL
ALT: 12 IU/L (ref 0–44)
AST: 15 IU/L (ref 0–40)
Albumin/Globulin Ratio: 1.7 (ref 1.2–2.2)
Albumin: 4.4 g/dL (ref 3.8–4.8)
Alkaline Phosphatase: 103 IU/L (ref 44–121)
BUN/Creatinine Ratio: 12 (ref 10–24)
BUN: 11 mg/dL (ref 8–27)
Bilirubin Total: 0.3 mg/dL (ref 0.0–1.2)
CO2: 25 mmol/L (ref 20–29)
Calcium: 9.8 mg/dL (ref 8.6–10.2)
Chloride: 97 mmol/L (ref 96–106)
Creatinine, Ser: 0.93 mg/dL (ref 0.76–1.27)
Globulin, Total: 2.6 g/dL (ref 1.5–4.5)
Glucose: 107 mg/dL — ABNORMAL HIGH (ref 70–99)
Potassium: 4.9 mmol/L (ref 3.5–5.2)
Sodium: 137 mmol/L (ref 134–144)
Total Protein: 7 g/dL (ref 6.0–8.5)
eGFR: 91 mL/min/{1.73_m2} (ref >59)

## 2021-08-15 LAB — CBC
Hematocrit: 43.8 % (ref 37.5–51.0)
Hemoglobin: 14.1 g/dL (ref 13.0–17.7)
MCH: 28 pg (ref 26.6–33.0)
MCHC: 32.2 g/dL (ref 31.5–35.7)
MCV: 87 fL (ref 79–97)
Platelets: 347 10*3/uL (ref 150–450)
RBC: 5.03 x10E6/uL (ref 4.14–5.80)
RDW: 13.7 % (ref 11.6–15.4)
WBC: 9.7 10*3/uL (ref 3.4–10.8)

## 2021-08-21 ENCOUNTER — Telehealth: Payer: Self-pay | Admitting: Cardiovascular Disease

## 2021-08-21 NOTE — Telephone Encounter (Signed)
Spoke with patient and reviewed all medications with him. He has not been getting some of his medications and there are discrepancies in his reports with various medications. He reports the dose on oxycodone is also not correct. Advised I am not able to update that medication but that all cardiac medications have been reviewed. Encouraged him to reach out to provider who prescribed that medication for updated list showing current dose he should be receiving. He verbalized understanding and requested I please fax list over to them. Confirmed fax number and will get that faxed. He was appreciative for the call with no further questions at this time.  ?

## 2021-08-21 NOTE — Telephone Encounter (Signed)
Reviewed patient concerns/questions regarding medications and advised I would fax over medication list. She verbalized understanding with no further questions at this time.  ?

## 2021-08-21 NOTE — Telephone Encounter (Signed)
Patient is requesting a call back to review his medications. He would also like to have his medication list faxed to [facility] at [phone] if possible. Please assist as able. Thank you. ?

## 2021-08-21 NOTE — Telephone Encounter (Signed)
Returned call and spoke with Chartered certified accountant. She states that she is unable to locate patients personal number and that he had questions about his medications. Will try reaching out to him.  ?

## 2021-08-28 NOTE — Telephone Encounter (Signed)
Pt c/o medication issue: ? ?1. Name of Medication: atorvastatin (LIPITOR) 40 MG tablet ?losartan (COZAAR) 50 MG tablet ? ?2. How are you currently taking this medication (dosage and times per day)?  ? ?3. Are you having a reaction (difficulty breathing--STAT)? No  ? ?4. What is your medication issue? Patient is calling stating he is still having issues with his medications. He reports the nurse tried to give him losartan this morning, but not atorvastatin. Patient states he was prescribed atorvastatin at his last appointment and not losartan so he did not take the losartan that was given to him. He is requesting a callback ASAP to discuss this and get it sorted out with the nurse working with him today.  ?

## 2021-08-28 NOTE — Telephone Encounter (Signed)
Spoke with the patient. ?Confirmed that he should be taking Losartan 50 mg daily and Atorvastatin 40 mg daily. ?Pt verbalized understanding and will let his nurse know. Patient voiced appreciation for the call back. ? ?

## 2021-09-11 ENCOUNTER — Telehealth: Payer: Self-pay | Admitting: Cardiovascular Disease

## 2021-09-11 NOTE — Telephone Encounter (Signed)
Patient is calling stating he is currently at [facility] and would like a nurse to call him back to discuss his medications.  ?

## 2021-09-11 NOTE — Telephone Encounter (Signed)
Spoke with patient and he requested we mail him a medication list. He had several complaints regarding facility and his medications. He feels strongly that they are not administering his medications correctly. He continued to review complaints of the care he is receiving. Encouraged him to reach out to the provider that manages his care there and the director of the facility. He verbalized understanding of our conversation with no further questions at this time.  ?

## 2021-11-02 ENCOUNTER — Telehealth: Payer: Self-pay | Admitting: Cardiovascular Disease

## 2021-11-02 NOTE — Telephone Encounter (Signed)
Spoke with Joshua Petersen and reviewed his concerns. He states they has not consistently received his carvedilol stating that they hold it for some limits. Advised that he should check with provider there at the facility to see if they have different instructions because we do not have any limitations, ranges, or other parameters for the carvedilol. He then went on to tell me about his shower day not being done, walking with his walker, sodium limits, and other topics not related to medication. Provided reassurance and instructions to please check with provider there to see if they may have changed it. He verbalized understanding with no further questions at this time.

## 2021-11-02 NOTE — Telephone Encounter (Signed)
Pt c/o medication issue:  1. Name of Medication: carvedilol (COREG) 6.25 MG tablet   2. How are you currently taking this medication (dosage and times per day)? Only getting medication when his BP is high  3. Are you having a reaction (difficulty breathing--STAT)? Occasional nausea, but patient says he is not nauseated at time of call   4. What is your medication issue? Patient was told by the facility that when his BP is at or below a "certain limit" the facility he is at does not give him his medication. He was not given the parameters by the facility so he is not sure what to do
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
## 2022-02-07 ENCOUNTER — Ambulatory Visit: Payer: Medicare Other | Attending: Physician Assistant | Admitting: Physician Assistant

## 2022-02-07 ENCOUNTER — Encounter: Payer: Self-pay | Admitting: Physician Assistant

## 2022-02-07 ENCOUNTER — Other Ambulatory Visit
Admission: RE | Admit: 2022-02-07 | Discharge: 2022-02-07 | Disposition: A | Discharge status: Home / Self Care | Payer: Medicare Other | Attending: Physician Assistant | Admitting: Physician Assistant

## 2022-02-07 VITALS — BP 116/64 | HR 84 | Ht 68.0 in | Wt 285.0 lb

## 2022-02-07 DIAGNOSIS — I5022 Chronic systolic (congestive) heart failure: Secondary | ICD-10-CM | POA: Insufficient documentation

## 2022-02-07 DIAGNOSIS — I10 Essential (primary) hypertension: Secondary | ICD-10-CM | POA: Insufficient documentation

## 2022-02-07 DIAGNOSIS — I428 Other cardiomyopathies: Secondary | ICD-10-CM | POA: Diagnosis not present

## 2022-02-07 DIAGNOSIS — E782 Mixed hyperlipidemia: Secondary | ICD-10-CM | POA: Insufficient documentation

## 2022-02-07 DIAGNOSIS — Z79899 Other long term (current) drug therapy: Secondary | ICD-10-CM | POA: Insufficient documentation

## 2022-02-07 LAB — COMPREHENSIVE METABOLIC PANEL
ALT: 10 U/L (ref 0–44)
AST: 15 U/L (ref 15–41)
Albumin: 3.8 g/dL (ref 3.5–5.0)
Alkaline Phosphatase: 73 U/L (ref 38–126)
Anion gap: 13 (ref 5–15)
BUN: 11 mg/dL (ref 8–23)
CO2: 29 mmol/L (ref 22–32)
Calcium: 9 mg/dL (ref 8.9–10.3)
Chloride: 97 mmol/L — ABNORMAL LOW (ref 98–111)
Creatinine, Ser: 1 mg/dL (ref 0.61–1.24)
GFR, Estimated: >60 mL/min (ref >60)
Glucose, Bld: 90 mg/dL (ref 70–99)
Potassium: 4 mmol/L (ref 3.5–5.1)
Sodium: 139 mmol/L (ref 135–145)
Total Bilirubin: 0.7 mg/dL (ref 0.3–1.2)
Total Protein: 7.6 g/dL (ref 6.5–8.1)

## 2022-02-07 LAB — LDL CHOLESTEROL, DIRECT: Direct LDL: 84 mg/dL (ref 0–99)

## 2022-02-07 LAB — LIPID PANEL
Cholesterol: 132 mg/dL (ref 0–200)
HDL: 38 mg/dL — ABNORMAL LOW (ref >40)
LDL Cholesterol: 81 mg/dL (ref 0–99)
Total CHOL/HDL Ratio: 3.5 RATIO
Triglycerides: 65 mg/dL (ref <150)
VLDL: 13 mg/dL (ref 0–40)

## 2022-02-07 LAB — CBC
HCT: 42.4 % (ref 39.0–52.0)
Hemoglobin: 13.4 g/dL (ref 13.0–17.0)
MCH: 28.5 pg (ref 26.0–34.0)
MCHC: 31.6 g/dL (ref 30.0–36.0)
MCV: 90.2 fL (ref 80.0–100.0)
Platelets: 318 10*3/uL (ref 150–400)
RBC: 4.7 MIL/uL (ref 4.22–5.81)
RDW: 13.2 % (ref 11.5–15.5)
WBC: 11.2 10*3/uL — ABNORMAL HIGH (ref 4.0–10.5)
nRBC: 0 % (ref 0.0–0.2)

## 2022-02-07 NOTE — Patient Instructions (Signed)
Medication Instructions:  No changes at this time.   *If you need a refill on your cardiac medications before your next appointment, please call your pharmacy*   Lab Work: CMP, Lipid, Direct LDL, and CBC to be done today over at the [facility]. Go to registration desk to check in.   If you have labs (blood work) drawn today and your tests are completely normal, you will receive your results only by: [place] (if you have MyChart) OR A paper copy in the mail If you have any lab test that is abnormal or we need to change your treatment, we will call you to review the results.   Testing/Procedures: None   Follow-Up: At [facility], you and your health needs are our priority.  As part of our continuing mission to provide you with exceptional heart care, we have created designated Provider Care Teams.  These Care Teams include your primary Cardiologist (physician) and Advanced Practice Providers (APPs -  Physician Assistants and Nurse Practitioners) who all work together to provide you with the care you need, when you need it.  We recommend signing up for the patient portal called "MyChart".  Sign up information is provided on this After Visit Summary.  MyChart is used to connect with patients for Virtual Visits (Telemedicine).  Patients are able to view lab/test results, encounter notes, upcoming appointments, etc.  Non-urgent messages can be sent to your provider as well.   To learn more about what you can do with MyChart, go to [email or web address].    Your next appointment:   6 month(s)  The format for your next appointment:   In Person  Provider:   Kathlyn Sacramento, MD or Christell Faith, PA-C       Important Information About [place]

## 2022-03-23 ENCOUNTER — Telehealth: Payer: Self-pay | Admitting: Internal Medicine

## 2022-03-23 NOTE — Telephone Encounter (Signed)
Patient is calling stating he is at [facility] and rehab in [place]. He is needing the office to call and let the nurse know it is okay for him to elevate his legs above his heart. He states the number to call is [phone], request to speak with Selena Batten.

## 2022-03-23 NOTE — Telephone Encounter (Signed)
Kim called back and advised her of Ryan's recommendation. She asks if this can be faxed to [phone]. Will attempt to route in EPIC.

## 2022-03-23 NOTE — Telephone Encounter (Signed)
Attempted to contact Kim at # provided, but after being transferred twice, the call was disconnected.  Called back and was sent to vm. Left message w/ Ryan's recommendation. Asked her to call back w/ any questions

## 2022-03-23 NOTE — Telephone Encounter (Signed)
From a cardiac perspective, no contraindication to leg elevation.

## 2022-03-27 ENCOUNTER — Telehealth: Payer: Self-pay | Admitting: Cardiovascular Disease

## 2022-03-27 NOTE — Telephone Encounter (Signed)
Caller stated the can make adjustment to the patient's bed to have his feet elevated.however the patient wants to be placed in a Trendelenburg position.  Caller wants to know if this position is safe for the patient.

## 2022-03-27 NOTE — Telephone Encounter (Signed)
Cala Bradford from [facility] care facility called stating pt has a bed that allows to switch to a trendelenburg position and wanted to know if MD felt pt was ok to have a bed with that self function. Nurse informed Cala Bradford to contact reach out to pcp as they would be able to determine if position is cognitively able to utilize bed safely. Cala Bradford verbalized understanding.

## 2022-03-30 ENCOUNTER — Telehealth: Payer: Self-pay | Admitting: Cardiovascular Disease

## 2022-03-30 NOTE — Telephone Encounter (Signed)
Pt c/o BP issue: STAT if pt c/o blurred vision, one-sided weakness or slurred speech  1. What are your last 5 BP readings?  11/08: 156/85 11/09: 165/103 154/91 11/10: 164/78 159/95 11/11: 157/120  11/12: 145/85 161/91 11//13: 160/95 157/101 11/16: 161/89 135/97   2. Are you having any other symptoms (ex. Dizziness, headache, blurred vision, passed out) Pain in feet, pain in back   3. What is your BP issue?  BP has been elevated

## 2022-03-30 NOTE — Telephone Encounter (Signed)
Spoke w/ pt. 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 He reports that he "was tired of the sermon and threw them out the window" about 2 weeks ago. He reports that percocet does nothing for his pain, "it's like shooting a freight train with a BB gun". He reports his back and leg pain are bad and they are somewhat relieved when he is able to elevate his legs above his heart, but no one has given him permission to put his bed in trendelenburg. He reports his BP has gone up and he would like to know if his meds need to be adjusted to compensate.  He is currently taking coreg 6.25 mg daily and losartan 50 mg daily. Advised him I will make Eula Listen, PA aware of his concerns and call him back w/ his recommendation.

## 2022-04-02 ENCOUNTER — Telehealth: Payer: Self-pay | Admitting: Student

## 2022-04-02 DIAGNOSIS — Z79899 Other long term (current) drug therapy: Secondary | ICD-10-CM

## 2022-04-02 MED ORDER — LOSARTAN POTASSIUM 100 MG PO TABS: 100.0000 mg | ORAL_TABLET | Freq: Every day | ORAL | 3 refills | Status: AC

## 2022-04-02 MED ORDER — CARVEDILOL 12.5 MG PO TABS: 12.5000 mg | ORAL_TABLET | Freq: Two times a day (BID) | ORAL | 3 refills | Status: AC

## 2022-04-02 NOTE — Telephone Encounter (Signed)
Medication list updated and BMP orders placed. Orders faxed to [employer] and message sent to scheduling to arrange appointment.

## 2022-04-02 NOTE — Telephone Encounter (Signed)
See other telephone encounter. Patient called back to follow up on fax that is being sent. Advised that they are working on that and he verbalized understanding.

## 2022-04-02 NOTE — Addendum Note (Signed)
Addended by: Parke Poisson on: 04/02/2022 08:27 AM   Modules accepted: Orders

## 2022-04-02 NOTE — Telephone Encounter (Signed)
See previous phone note, pt states he would like for someone to fax over to nursing home that he needs to see pain specialist.

## 2022-04-02 NOTE — Telephone Encounter (Signed)
Spoke with patient and reviewed provider fax instructions. He inquired about the pain management and reviewed provider notes of the conversation as follows:   Joshua Petersen   04/02/22  7:38 AM  Note Also discussed that he may need adjustments to his pain medications and recommended discussing with provider who manages this.   Advised that he should check with his PCP or provider managing his care. He verbalized understanding with no further questions at this time.

## 2022-04-02 NOTE — Telephone Encounter (Signed)
  Patient called Answering Service with concerns about elevated BP. Called and spoke with patient. He stated he called Friday and gave a list of his BP reading and is waiting to hear back. Per telephone note on 03/30/2022, BP log showed: 11/08: 156/85 11/09: 165/103 154/91 11/10: 164/78 159/95 11/11: 157/120  11/12: 145/85 161/91 11//13: 160/95 157/101 11/16: 161/89 135/97  It sounds like BP has been higher over the weekend with BP consistently in the 170s/90s-100s and systolic BP as high as the 200s. Patient has chronic pain issues and his pain is currently not well controlled which I suspect is contributing. He reports some very vague brief chest discomfort that he had on Friday when he called and then again briefly this morning that only lasted for a couple of minutes. It was very hard to get him to elaborate on this and he just kept saying this happened last time they stopped a medicine. When I asked him to clarify what medicine, he said one of his pain medications. He is not currently having any chest pain. He denies any stroke like symptoms. BP prior to calling was in the 170s/100s. Heart rates in the 90s.  Patient has Coreg 6.25mg  twice daily, Losartan 50mg  daily, and Spironolactone 25mg  daily listed under his medication list but was not sure exactly what he was taking. He lives at [facility] so asked him to ask a staff member what he was taking. I spoke with one of the nurses who confirmed that patient is taking the Coreg, Losartan, and Spironolactone as above. Therefore, recommended increasing his Coreg to 12.5mg  twice daily and Losartan 100mg  daily. Will need a repeat BMET in 1 week which he can have done there. I discussed this plan with both patient and nurse. Nurse asked if we could fax this over. Also discussed that he may need adjustments to his pain medications and recommended discussing with provider who manages this. Advised patient to let know if he is having any  recurrent chest pain.  Triage, can you please help me fax medication changes and BMET order to [facility]? Fax number is [phone] (Attn ). I also recommended a follow-up visit so we can follow-up on his BP. Can you please help arrange this as well?  Thank you so much! Shauntavia Brackin

## 2022-04-02 NOTE — Telephone Encounter (Signed)
Patient called back this morning. Recommended increasing Coreg to 12.5mg  twice daily and Losartan to 100mg  daily given more recent BP readings. Will then repeat BMET in 1 week.  Please see phone note from 04/02/2022 for more details.  04/04/2022, PA-C 04/02/2022 7:39 AM

## 2022-04-09 ENCOUNTER — Telehealth: Payer: Self-pay | Admitting: Cardiovascular Disease

## 2022-04-09 NOTE — Telephone Encounter (Signed)
Returned the call to the patient. He stated that his blood pressures have been elevated even after increasing the Carvedilol to 12.5 mg bid and increasing the Losartan to 100 mg once daily. The only readings he gave were 170/100 and HR 75 and 176/111  He takes: Carvedilol 12.5 mg twice daily Losartan 100 mg once daily Spironolactone 25 mg once daily  Call placed to [employer] at [employer] [facility]. She confirmed the current doses of medication. She stated that the readings they get are not as high. They are typically 140/80's before medications. She will fax the blood pressure reading log to the office. She did state that the patient has his own blood pressure monitor that he wears on his wrist and checks it frequently through out the day.   Appointment 12/12 with Dr. Kirke Corin

## 2022-04-09 NOTE — Telephone Encounter (Signed)
  Pt c/o BP issue: STAT if pt c/o blurred vision, one-sided weakness or slurred speech  1. What are your last 5 BP readings? 170/100 hr 75, 176/111  2. Are you having any other symptoms (ex. Dizziness, headache, blurred vision, passed out)? Blurry vision at times but not right now, had to wear his glasses more often over the last few weeks  3. What is your BP issue? Dr recently changed is medicaton but Bp is still running high

## 2022-04-09 NOTE — Telephone Encounter (Signed)
His last blood pressure in the office was well-controlled.  I suspect that his machine is not accurate.  Will await the results of blood pressure checks by the nurse.  He should bring his blood pressure machine with him to his appointment to compare with our readings.

## 2022-04-24 ENCOUNTER — Encounter: Payer: Self-pay | Admitting: Cardiovascular Disease

## 2022-04-24 ENCOUNTER — Ambulatory Visit: Payer: Medicare Other | Attending: Cardiovascular Disease | Admitting: Cardiovascular Disease

## 2022-04-24 VITALS — BP 118/57 | HR 84 | Ht 68.0 in | Wt 285.0 lb

## 2022-04-24 DIAGNOSIS — G8929 Other chronic pain: Secondary | ICD-10-CM

## 2022-04-24 DIAGNOSIS — E782 Mixed hyperlipidemia: Secondary | ICD-10-CM | POA: Diagnosis present

## 2022-04-24 DIAGNOSIS — I5022 Chronic systolic (congestive) heart failure: Secondary | ICD-10-CM

## 2022-04-24 DIAGNOSIS — I10 Essential (primary) hypertension: Secondary | ICD-10-CM
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
## 2022-04-24 NOTE — Patient Instructions (Signed)
Medication Instructions:  No changes *If you need a refill on your cardiac medications before your next appointment, please call your pharmacy*   Lab Work: None ordered If you have labs (blood work) drawn today and your tests are completely normal, you will receive your results only by: MyChart Message (if you have MyChart) OR A paper copy in the mail If you have any lab test that is abnormal or we need to change your treatment, we will call you to review the results.   Testing/Procedures: None ordered   Follow-Up: At [facility], you and your health needs are our priority.  As part of our continuing mission to provide you with exceptional heart care, we have created designated Provider Care Teams.  These Care Teams include your primary Cardiologist (physician) and Advanced Practice Providers (APPs -  Physician Assistants and Nurse Practitioners) who all work together to provide you with the care you need, when you need it.  We recommend signing up for the patient portal called "MyChart".  Sign up information is provided on this After Visit Summary.  MyChart is used to connect with patients for Virtual Visits (Telemedicine).  Patients are able to view lab/test results, encounter notes, upcoming appointments, etc.  Non-urgent messages can be sent to your provider as well.   To learn more about what you can do with MyChart, go to [email or web address].    Your next appointment:   6 month(s)  The format for your next appointment:   In Person  Provider:   You will see one of the following Advanced Practice Providers on your designated Care Team:    Eula Listen, PA-C   Important Information About Sugar

## 2022-06-07 ENCOUNTER — Encounter: Payer: Self-pay | Admitting: Nurse Practitioner

## 2022-06-07 ENCOUNTER — Non-Acute Institutional Stay: Payer: Medicare Other | Admitting: Nurse Practitioner

## 2022-06-07 DIAGNOSIS — R5381 Other malaise: Secondary | ICD-10-CM

## 2022-06-07 DIAGNOSIS — R0602 Shortness of breath: Secondary | ICD-10-CM

## 2022-06-07 DIAGNOSIS — Z515 Encounter for palliative care: Secondary | ICD-10-CM

## 2022-06-07 DIAGNOSIS — I509 Heart failure, unspecified: Secondary | ICD-10-CM
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
## 2022-06-28 ENCOUNTER — Non-Acute Institutional Stay: Payer: Medicare Other | Admitting: Nurse Practitioner

## 2022-06-28 ENCOUNTER — Encounter: Payer: Self-pay | Admitting: Nurse Practitioner

## 2022-06-28 DIAGNOSIS — R0602 Shortness of breath: Secondary | ICD-10-CM

## 2022-06-28 DIAGNOSIS — R5381 Other malaise: Secondary | ICD-10-CM

## 2022-06-28 DIAGNOSIS — I509 Heart failure, unspecified: Secondary | ICD-10-CM

## 2022-06-28 DIAGNOSIS — Z515 Encounter for palliative care: Secondary | ICD-10-CM
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
## 2022-07-02 ENCOUNTER — Telehealth: Payer: Self-pay | Admitting: Cardiovascular Disease

## 2022-07-02 NOTE — Telephone Encounter (Signed)
Pt c/o medication issue:  1. Name of Medication:   spironolactone (ALDACTONE) 25 MG tablet    2. How are you currently taking this medication (dosage and times per day)?   3. Are you having a reaction (difficulty breathing--STAT)?   4. What is your medication issue? Patient states they would like a call back to get clarification if this is a medication he should be taking currently and the reason as to why.

## 2022-07-02 NOTE — Telephone Encounter (Signed)
Spoke to patient and informed him that the spironolactone (ALDACTONE) 25 MG tablet 25 mg, Daily in conjunction with his other blood pressure medications helps keep his blood pressure controlled. Patient was satisfied and thankful for response. Stated that he will continue taking.

## 2022-07-05 NOTE — Telephone Encounter (Signed)
Returned the call to the patient. We went over the medications and explained what each medication is used for. All questions have been answered.

## 2022-07-05 NOTE — Telephone Encounter (Signed)
Patient is calling back to talk about this medication again and his other medications. He states that he is still confused as how he should be taking this an would like to speak to someone in regards to this.

## 2022-08-10 ENCOUNTER — Ambulatory Visit: Payer: Medicare Other | Admitting: Cardiovascular Disease

## 2022-08-14 ENCOUNTER — Encounter: Payer: Self-pay | Admitting: Nurse Practitioner

## 2022-08-14 ENCOUNTER — Non-Acute Institutional Stay: Payer: Medicare Other | Admitting: Nurse Practitioner

## 2022-08-14 DIAGNOSIS — I509 Heart failure, unspecified: Secondary | ICD-10-CM

## 2022-08-14 DIAGNOSIS — R5381 Other malaise: Secondary | ICD-10-CM

## 2022-08-14 DIAGNOSIS — Z515 Encounter for palliative care: Secondary | ICD-10-CM

## 2022-08-14 DIAGNOSIS — R0602 Shortness of breath: Secondary | ICD-10-CM
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
## 2022-08-29 ENCOUNTER — Ambulatory Visit: Payer: Medicare Other | Admitting: Dietician

## 2022-09-14 ENCOUNTER — Telehealth: Payer: Self-pay | Admitting: Cardiovascular Disease

## 2022-09-14 NOTE — Telephone Encounter (Signed)
Called and spoke with patient. Patient with complaint of right thigh pain for 6-7 weeks. Patient states that he was seen by his PCP for the pain and was prescribed Voltaren Gel. Patient states that initially it did help with the pain. Patient states that he is now having pain radiate down from his thigh to his lower leg. Patient denies any swelling, redness or warmth to the touch. Patient was recommend to contact his PCP for further evaluation of his leg pain.

## 2022-09-14 NOTE — Telephone Encounter (Signed)
Joshua Bible called stating that he had a mini stroke in 2019 and is unsure if he is having on currently. Pt stated that his right tight started hurting and it resembled the pain to having a broken bone. Started hurting about 6 weeks again and now it started working down his lower leg. Yesterday evening, tried to stand up it started like a cramp, but he couldn't stand up on his leg. Patient stated that stopped hurting after about 20 mins. Patient stated that this reminds him of when he had a mini stroke in 2019. Per triage routing phone note. Please advise.

## 2022-10-26 ENCOUNTER — Non-Acute Institutional Stay: Payer: Medicare Other | Admitting: Nurse Practitioner

## 2022-10-26 ENCOUNTER — Encounter: Payer: Self-pay | Admitting: Nurse Practitioner

## 2022-10-26 DIAGNOSIS — I509 Heart failure, unspecified: Secondary | ICD-10-CM

## 2022-10-26 DIAGNOSIS — Z515 Encounter for palliative care: Secondary | ICD-10-CM

## 2022-10-26 DIAGNOSIS — R5381 Other malaise: Secondary | ICD-10-CM

## 2022-10-26 DIAGNOSIS — R0602 Shortness of breath: Secondary | ICD-10-CM
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
## 2022-10-30 ENCOUNTER — Telehealth: Payer: Self-pay | Admitting: Cardiovascular Disease

## 2022-10-30 NOTE — Telephone Encounter (Signed)
Pt c/o medication issue:  1. Name of Medication:   carvedilol (COREG) 12.5 MG tablet  spironolactone (ALDACTONE) 25 MG tablet   2. How are you currently taking this medication (dosage and times per day)?   As prescribed  3. Are you having a reaction (difficulty breathing--STAT)?  No  4. What is your medication issue?   Patient wants to know if he will need to keep taking these medications for the rest of his life or will he be able to stop taking this medication.

## 2022-10-30 NOTE — Telephone Encounter (Signed)
Spoke to patient and informed him that she should continue taking all of his medications cas prescribed by the provider and if there are any changes that are made then they'll be adjusted case by case. Patient stated that he just wanted to make sure and that he would follow-up with this appointment 11/22/22

## 2022-11-22 ENCOUNTER — Encounter: Payer: Self-pay | Admitting: Cardiovascular Disease

## 2022-11-22 ENCOUNTER — Ambulatory Visit: Payer: Medicare Other | Attending: Cardiovascular Disease | Admitting: Cardiovascular Disease

## 2022-11-22 VITALS — BP 104/60 | HR 58 | Ht 68.0 in | Wt 293.1 lb

## 2022-11-22 DIAGNOSIS — I10 Essential (primary) hypertension: Secondary | ICD-10-CM | POA: Insufficient documentation

## 2022-11-22 DIAGNOSIS — E782 Mixed hyperlipidemia: Secondary | ICD-10-CM | POA: Diagnosis present

## 2022-11-22 DIAGNOSIS — I5022 Chronic systolic (congestive) heart failure: Secondary | ICD-10-CM | POA: Diagnosis present

## 2022-11-22 NOTE — Patient Instructions (Signed)
Medication Instructions:  No changes *If you need a refill on your cardiac medications before your next appointment, please call your pharmacy*   Lab Work: None ordered If you have labs (blood work) drawn today and your tests are completely normal, you will receive your results only by: MyChart Message (if you have MyChart) OR A paper copy in the mail If you have any lab test that is abnormal or we need to change your treatment, we will call you to review the results.   Testing/Procedures: None ordered   Follow-Up: At [facility], you and your health needs are our priority.  As part of our continuing mission to provide you with exceptional heart care, we have created designated Provider Care Teams.  These Care Teams include your primary Cardiologist (physician) and Advanced Practice Providers (APPs -  Physician Assistants and Nurse Practitioners) who all work together to provide you with the care you need, when you need it.  We recommend signing up for the patient portal called "MyChart".  Sign up information is provided on this After Visit Summary.  MyChart is used to connect with patients for Virtual Visits (Telemedicine).  Patients are able to view lab/test results, encounter notes, upcoming appointments, etc.  Non-urgent messages can be sent to your provider as well.   To learn more about what you can do with MyChart, go to [email or web address].    Your next appointment:   12 month(s)  Provider:   You may see Muhammad Arida, MD or one of the following Advanced Practice Providers on your designated Care Team:   Christopher Berge, NP Ryan Dunn, PA-C Cadence Furth, PA-C Sheri Hammock, NP    
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
## 2022-12-04 ENCOUNTER — Telehealth: Payer: Self-pay | Admitting: Cardiovascular Disease

## 2022-12-04 NOTE — Telephone Encounter (Signed)
Pt c/o medication issue:  1. Name of Medication:   losartan (COZAAR) 100 MG tablet  atorvastatin (LIPITOR) 80 MG tablet  carvedilol (COREG) 12.5 MG tablet   2. How are you currently taking this medication (dosage and times per day)?   3. Are you having a reaction (difficulty breathing--STAT)?   4. What is your medication issue? Patient states he is confused as to which medications he should be taking. Please advise.

## 2022-12-04 NOTE — Telephone Encounter (Signed)
Called patient, advised that he was confused on his medications- we did go over last visit with Dr.Arida, to continue losartan and carvedilol, and his statin. Patient aware. Verbalized understanding.  He is at a facility- after speaking with their staff- patient is taking 25 mg of carvedilol, and the head RN states his vitals have been fine on this 148/81, most recent blood pressure. Advised just wanted to make Dr.Arida aware to update the medication list here for [place].   Thanks!

## 2022-12-06 ENCOUNTER — Encounter: Payer: Self-pay | Admitting: Neurosurgery

## 2022-12-06 ENCOUNTER — Ambulatory Visit (INDEPENDENT_AMBULATORY_CARE_PROVIDER_SITE_OTHER): Payer: Medicare Other | Admitting: Neurosurgery

## 2022-12-06 VITALS — BP 130/74 | Ht 68.0 in | Wt 293.0 lb

## 2022-12-06 DIAGNOSIS — Z6841 Body Mass Index (BMI) 40.0 and over, adult: Secondary | ICD-10-CM

## 2022-12-06 DIAGNOSIS — M545 Low back pain, unspecified: Secondary | ICD-10-CM | POA: Diagnosis not present

## 2022-12-06 NOTE — Addendum Note (Signed)
Addended by: Adelene Amas on: 12/06/2022 02:08 PM   Modules accepted: Orders

## 2023-01-17 ENCOUNTER — Ambulatory Visit
Payer: Medicare Other | Attending: Student in an Organized Health Care Education/Training Program | Admitting: Student in an Organized Health Care Education/Training Program

## 2023-01-17 ENCOUNTER — Encounter: Payer: Self-pay | Admitting: Student in an Organized Health Care Education/Training Program

## 2023-01-17 VITALS — BP 107/61 | HR 58 | Temp 98.1°F | Ht 68.0 in | Wt 293.0 lb

## 2023-01-17 DIAGNOSIS — M47816 Spondylosis without myelopathy or radiculopathy, lumbar region: Secondary | ICD-10-CM | POA: Diagnosis not present

## 2023-01-17 DIAGNOSIS — F1010 Alcohol abuse, uncomplicated: Secondary | ICD-10-CM | POA: Insufficient documentation

## 2023-01-17 DIAGNOSIS — R531 Weakness: Secondary | ICD-10-CM | POA: Diagnosis not present

## 2023-01-17 DIAGNOSIS — G894 Chronic pain syndrome: Secondary | ICD-10-CM | POA: Insufficient documentation

## 2023-01-17 DIAGNOSIS — G8929 Other chronic pain: Secondary | ICD-10-CM | POA: Insufficient documentation

## 2023-01-17 DIAGNOSIS — M545 Low back pain, unspecified: Secondary | ICD-10-CM | POA: Insufficient documentation

## 2023-01-17 NOTE — Progress Notes (Signed)
Patient: Joshua Petersen  Service Category: E/M  Provider: Edward Jolly, MD  DOB: 30-Dec-1955  DOS: 01/17/2023  Referring Provider: Susanne Borders, PA  MRN: [id number]  Setting: Ambulatory outpatient  PCP: Dione Housekeeper, MD  Type: New Patient  Specialty: Interventional Pain Management    Location: Office  Delivery: Face-to-face     Primary Reason(s) for Visit: Encounter for initial evaluation of one or more chronic problems (new to examiner) potentially causing chronic pain, and posing a threat to normal musculoskeletal function. (Level of risk: High) CC: Foot Pain (Both, left is the worse)  HPI  [name] is a 67 y.o. year old, male patient, who comes for the first time to our practice referred by Susanne Borders, PA for our initial evaluation of his chronic pain. 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; Status post total knee replacement using cement, right; Drug overdose; Alcohol withdrawal syndrome with complication, with unspecified complication (HCC); Acute respiratory failure with hypoxia and hypercapnia (HCC); AKI (acute kidney injury) (HCC); Acute systolic CHF (congestive heart failure) (HCC); Essential hypertension; Opioid abuse (HCC); Acute metabolic encephalopathy; Alcoholic cardiomyopathy (HCC); Hypernatremia; Alcohol abuse; Generalized weakness; Status post reverse arthroplasty of shoulder, left; Chronic pain syndrome; and Primary osteoarthritis of left knee on their problem list. Today he comes in for evaluation of his Foot Pain (Both, left is the worse)  Pain Assessment: Location: Left, Right Foot Radiating: left foot is the worse Onset: More than a month ago Duration: Chronic pain Quality: Discomfort, Aching, Burning, Constant, Tightness, Throbbing, Tender, Stabbing, Shooting, Radiating Severity: 5  /10 (subjective, self-reported pain score)  Effect on ADL: limits my daily activities Timing: Constant Modifying factors: nothing BP: 107/61  HR: (!) 58  Onset and Duration: Present longer than 3 months Cause of pain: Work related accident or event Severity: Getting worse, NAS-11 at its worse: 10/10, NAS-11 at its best: 4/10, NAS-11 now: 5/10, and NAS-11 on the average: 5/10 Timing: Morning, Night, During activity or exercise, After activity or exercise, and After a period of immobility Aggravating Factors: Bending, Climbing, Kneeling, Lifiting, Motion, Prolonged standing, Squatting, Stooping , Surgery made it worse, Twisting, Walking, Walking uphill, Walking downhill, and Working Alleviating Factors: Medications Associated Problems: Day-time cramps, Fatigue, Weakness, Pain that wakes patient up, and Pain that does not allow patient to sleep Quality of Pain: Work related   [name] is being evaluated for possible interventional pain management therapies for the treatment of his chronic pain.   Patient presents today with multiple pain complaints.  Of note he was last seen by me in 17 October 2020 for a virtual visit after left knee genicular nerve block for him which unfortunately was not helpful.  He continues to have persistent bilateral knee pain.  He also endorses low back pain.  He has seen neurosurgery for this.  Surgery was not recommended, he was referred here to consider stimulation based therapies.  When I was talking to the patient about his condition, he was unclear why he was here.  He kept telling me about a MRI from 2014 that showed fractures of his vertebral spine.  I politely indicated to him that I was reviewing his recent MRI from 2022 which primarily shows facet arthropathy without any neural compression.  I did offer the patient additional information about peripheral nerve stimulation of his medial branches  as well as diagnostic lumbar facet medial branch nerve blocks that we  could consider prior to peripheral nerve stimulation.  Patient became somewhat rude and stated that he was not interested.  He left midway during our encounter.  Historic Controlled Substance Pharmacotherapy Review  Fentanyl patch 50 mcg an hour, methadone 10 mg 3 times daily as needed, total MME over 261 Historical Monitoring: The patient  reports no history of drug use. List of prior UDS Testing: Lab Results  Component Value Date   MDMA NONE DETECTED 04/23/2019   COCAINSCRNUR NONE DETECTED 04/23/2019   PCPSCRNUR NONE DETECTED 04/23/2019   THCU NONE DETECTED 04/23/2019   ETH <10 04/23/2019   Historical Background Evaluation: [place] PMP: PDMP reviewed during this encounter. Review of the past 75-months conducted.              [place] Department of public safety, offender search: Engineer, mining Information) Non-contributory Risk Assessment Profile: Aberrant behavior: None observed or detected today Risk factors for fatal opioid overdose: High dose therapy Fatal overdose hazard ratio (HR): 2.04 for doses equal to, or higher than 100 MME/day Non-fatal overdose hazard ratio (HR): 2.88 for doses equal to, or higher than 200 MME/day Risk of opioid abuse or dependence: 0.7-3.0% with doses ? 36 MME/day and 6.1-26% with doses ? 120 MME/day. Substance use disorder (SUD) risk level: See below Pharmacologic Plan: No opioid analgesics.            Initial impression: Patient not interested in interventional pain management Meds   Current Outpatient Medications:    atorvastatin (LIPITOR) 80 MG tablet, Take 80 mg by mouth daily., Disp: , Rfl:    carvedilol (COREG) 12.5 MG tablet, Take 1 tablet (12.5 mg total) by mouth 2 (two) times daily., Disp: 180 tablet, Rfl: 3   cetirizine (ZYRTEC) 10 MG tablet, Take by mouth., Disp: , Rfl:    losartan (COZAAR) 100 MG tablet, Take 1 tablet (100 mg total) by mouth daily., Disp: 90 tablet, Rfl: 3   Melatonin 3 MG CAPS, Take 10 mg by mouth at bedtime., Disp: , Rfl:     pantoprazole (PROTONIX) 40 MG tablet, Take 40 mg by mouth daily., Disp: , Rfl:    spironolactone (ALDACTONE) 25 MG tablet, Take 25 mg by mouth daily., Disp: , Rfl:    tamsulosin (FLOMAX) 0.4 MG CAPS capsule, Take by mouth., Disp: , Rfl:    Lidocaine-Prilocaine &Lido HCl 2.5-2.5 & 3.88 % KIT, Apply 1 application  topically daily as needed (pain). (Patient not taking: Reported on 01/17/2023), Disp: , Rfl:   Imaging Review    Narrative CLINICAL DATA:  Encephalopathy and chronic pain  EXAM: CT HEAD WITHOUT CONTRAST  CT CERVICAL SPINE WITHOUT CONTRAST  TECHNIQUE: Multidetector CT imaging of the head and cervical spine was performed following the standard protocol without intravenous contrast. Multiplanar CT image reconstructions of the cervical spine were also generated.  COMPARISON:  None.  FINDINGS: CT HEAD FINDINGS  Brain: There is no mass, hemorrhage or extra-axial collection. The size and configuration of the ventricles and extra-axial CSF spaces are normal. The brain parenchyma is normal, without evidence of acute or chronic infarction.  Vascular: Atherosclerotic calcification of the internal carotid arteries at the skull base. No abnormal hyperdensity of the major intracranial arteries or dural venous sinuses.  Skull: The visualized skull base, calvarium and extracranial soft tissues are normal.  Sinuses/Orbits: No fluid levels or advanced mucosal thickening of the visualized paranasal sinuses. No mastoid or middle ear effusion. The orbits are normal.  CT CERVICAL SPINE FINDINGS  Alignment: No static subluxation. Facets are aligned. Occipital condyles are normally positioned.  Skull base and vertebrae: No acute fracture.  Soft tissues and spinal canal: No prevertebral fluid or swelling. No visible canal hematoma.  Disc levels: No advanced spinal canal or neural foraminal stenosis.  Upper chest: No pneumothorax, pulmonary nodule or pleural effusion.  Other:  Normal visualized paraspinal cervical soft tissues.  IMPRESSION: 1. No acute intracranial abnormality. 2. No acute fracture or static subluxation of the cervical spine.   Electronically Signed By: Deatra Robinson M.D. On: 04/23/2019 03:28   Narrative CLINICAL DATA:  Chronic left shoulder pain. Limited range of motion.  EXAM: MRI OF THE LEFT SHOULDER WITHOUT CONTRAST  TECHNIQUE: Multiplanar, multisequence MR imaging of the shoulder was performed. No intravenous contrast was administered.  COMPARISON:  None.  FINDINGS: Rotator cuff: Significant rotator cuff tendinopathy/tendinosis with interstitial tears. No full-thickness retracted tear.  Muscles:  Mild fatty atrophy of the shoulder musculature.  Biceps long head: Intact. Moderate tendinopathy involving the intra-articular portion.  Acromioclavicular Joint: Advanced degenerative changes. Type 2 acromion. Mild lateral downsloping without definite undersurface spurring.  Glenohumeral Joint: Severe degenerative changes with areas of full-thickness cartilage loss, joint space narrowing, osteophytic spurring and subchondral cystic change. Small joint effusion.  Labrum:  Degenerated and torn.  Bones:  No acute bony findings.  Other: Mild subacromial/subdeltoid bursitis.  IMPRESSION: 1. Significant rotator cuff tendinopathy/tendinosis with interstitial tears. No full-thickness retracted tear. 2. Intact long head biceps tendon. Moderate tendinopathy involving the intra-articular portion. 3. Severe glenohumeral joint degenerative changes. 4. Degenerated and torn glenoid labrum. 5. Moderate AC joint degenerative changes and mild lateral downsloping of a type 2 acromion. 6. Mild subacromial/subdeltoid bursitis.   Electronically Signed By: Rudie Meyer M.D. On: 10/05/2019 10:58   MR LUMBAR SPINE WO CONTRAST  Narrative CLINICAL DATA:  Chronic low back pain  EXAM: MRI LUMBAR SPINE WITHOUT  CONTRAST  TECHNIQUE: Multiplanar, multisequence MR imaging of the lumbar spine was performed. No intravenous contrast was administered.  COMPARISON:  06/17/2018  FINDINGS: Segmentation:  Standard.  Alignment:  Physiologic.  Vertebrae: No acute fracture or suspicious osseous lesion. Unchanged mild vertebral body height loss at L1.  Conus medullaris and cauda equina: Conus extends to the L1-L2 level. Conus and cauda equina appear normal.  Paraspinal and other soft tissues: Negative.  Disc levels:  T12-L1: No significant disc bulge. No spinal canal stenosis or neural foraminal narrowing.  L1-L2: Minimal disc bulge. No spinal canal stenosis or neural foraminal narrowing.  L2-L3: Mild disc bulge. No spinal canal stenosis or neural foraminal narrowing.  L3-L4: Mild disc bulge. Moderate facet arthropathy. No spinal canal stenosis or neural foraminal narrowing.  L4-L5: Mild disc bulge. Mild facet arthropathy. No spinal canal stenosis or neural foraminal narrowing.  L5-S1: Mild disc bulge. Mild facet arthropathy. No spinal canal stenosis or neural foraminal narrowing.  IMPRESSION: Mild degenerative changes in the lumbar spine with mild-to-moderate facet arthropathy but without spinal canal stenosis or neural foraminal narrowing.   Electronically Signed By: Wiliam Ke M.D. On: 05/11/2021 16:54  DG HIP UNILAT W OR W/O PELVIS 2-3 VIEWS LEFT  Narrative CLINICAL DATA:  Status post left hip arthroplasty.  EXAM: DG HIP (WITH OR WITHOUT PELVIS) 2-3V LEFT  COMPARISON:  03/12/2017  FINDINGS: Postoperative changes from a left total hip arthroplasty identified. The hardware components are in anatomic alignment. No periprosthetic fracture or subluxation.  IMPRESSION: 1. No complications status post left hip arthroplasty.   Electronically Signed By:  Signa Kell M.D. On: 03/12/2017 11:59   Narrative CLINICAL DATA:  Chronic right knee pain.  EXAM: MRI OF  THE RIGHT KNEE WITHOUT CONTRAST  TECHNIQUE: Multiplanar, multisequence MR imaging of the knee was performed. No intravenous contrast was administered.  COMPARISON:  None.  FINDINGS: MENISCI  Medial meniscus: There is an extensive horizontal tear involving the entire posterior horn extending toward the posterior aspect of the midbody.  Lateral meniscus:  Intact.  LIGAMENTS  Cruciates:  Intact.  Collaterals: Thickening of the proximal MCL with fluid deep to the MCL consistent with medial bursitis. The thickening is probably due to a remote sprain. The LCL appears normal.  CARTILAGE  Patellofemoral:  Normal.  Medial: There is extensive denuding of the articular cartilage prominent subcortical edema in the femoral condyle and medial tibial plateau.  Lateral: There are focal areas of partial and full-thickness cartilage loss in posterior aspect of the tibial plateau and in the central portion of the femoral condyle.  Joint: Moderate joint effusion. Slightly thickened medial and suprapatellar plicae.  Popliteal Fossa:  No Baker cyst. Intact popliteus tendon.  Extensor Mechanism:  Intact quadriceps tendon and patellar tendon.  Bones: There is subcortical edema in the periphery of the medial femoral condyle and diffusely in the medial tibial plateau. There is a small focal area of increased subcortical sclerosis the periphery of tibial plateau which may represent an insufficiency fracture, best seen on image 26 of series 12 image 17 of series 11. There is prominent edema in the soft tissues of the medial aspect of the ankle which is felt to be due to the medial compartment disease.  Other: None  IMPRESSION: 1. Extensive horizontal tear of the posterior horn of the medial meniscus. 2. Extensive full-thickness cartilage loss in the medial compartment with subcortical edema in the medial compartment with a probable small insufficiency fracture of the medial tibial  plateau. 3. Focal areas of cartilage loss in the lateral compartment. 4. Medial bursitis and subcutaneous edema at the medial aspect of the knee felt to be secondary to the medial compartment disease.   Electronically Signed By: Francene Boyers M.D. On: 05/13/2018 16:39  Complexity Note: Imaging results reviewed.                         ROS  Cardiovascular: Heart trouble, High blood pressure, and Chest pain Pulmonary or Respiratory: No reported pulmonary signs or symptoms such as wheezing and difficulty taking a deep full breath (Asthma), difficulty blowing air out (Emphysema), coughing up mucus (Bronchitis), persistent dry cough, or temporary stoppage of breathing during sleep Neurological: No reported neurological signs or symptoms such as seizures, abnormal skin sensations, urinary and/or fecal incontinence, being born with an abnormal open spine and/or a tethered spinal cord Psychological-Psychiatric: No reported psychological or psychiatric signs or symptoms such as difficulty sleeping, anxiety, depression, delusions or hallucinations (schizophrenial), mood swings (bipolar disorders) or suicidal ideations or attempts Gastrointestinal: Reflux or heatburn Genitourinary: No reported renal or genitourinary signs or symptoms such as difficulty voiding or producing urine, peeing blood, non-functioning kidney, kidney stones, difficulty emptying the bladder, difficulty controlling the flow of urine, or chronic kidney disease Hematological: No reported hematological signs or symptoms such as prolonged bleeding, low or poor functioning platelets, bruising or bleeding easily, hereditary bleeding problems, low energy levels due to low hemoglobin or being anemic Endocrine: No reported endocrine signs or symptoms such as high or low blood sugar, rapid heart rate due to high thyroid  levels, obesity or weight gain due to slow thyroid or thyroid disease Rheumatologic: Rheumatoid arthritis and Constant  unexplained fatigue (Chronic Fatigue Syndrome) Musculoskeletal: Negative for myasthenia gravis, muscular dystrophy, multiple sclerosis or malignant hyperthermia Work History: Retired and Disabled  Allergies  Mr. Merrick has No Known Allergies.  Laboratory Chemistry Profile   Renal Lab Results  Component Value Date   BUN 11 02/07/2022   CREATININE 1.00 02/07/2022   BCR 12 08/14/2021   GFRAA >60 12/11/2019   GFRNONAA >60 02/07/2022   PROTEINUR NEGATIVE 12/08/2019     Electrolytes Lab Results  Component Value Date   NA 139 02/07/2022   K 4.0 02/07/2022   CL 97 (L) 02/07/2022   CALCIUM 9.0 02/07/2022   MG 1.7 04/28/2019   PHOS 4.6 04/30/2019     Hepatic Lab Results  Component Value Date   AST 15 02/07/2022   ALT 10 02/07/2022   ALBUMIN 3.8 02/07/2022   ALKPHOS 73 02/07/2022   LIPASE 49 04/23/2019   AMMONIA 38 (H) 04/30/2019     ID Lab Results  Component Value Date   HIV NON REACTIVE 04/24/2019   SARSCOV2NAA NEGATIVE 12/08/2019   STAPHAUREUS NEGATIVE 12/08/2019   MRSAPCR NEGATIVE 12/08/2019     Bone No results found for: "VD25OH", "VD125OH2TOT", "ZO1096EA5", "VD2125OH2", "25OHVITD1", "25OHVITD2", "25OHVITD3", "TESTOFREE", "TESTOSTERONE"   Endocrine Lab Results  Component Value Date   GLUCOSE 90 02/07/2022   GLUCOSEU NEGATIVE 12/08/2019   HGBA1C 6.8 (H) 04/29/2019   TSH 1.831 04/28/2019     Neuropathy Lab Results  Component Value Date   HGBA1C 6.8 (H) 04/29/2019   HIV NON REACTIVE 04/24/2019     CNS No results found for: "COLORCSF", "APPEARCSF", "RBCCOUNTCSF", "WBCCSF", "POLYSCSF", "LYMPHSCSF", "EOSCSF", "PROTEINCSF", "GLUCCSF", "JCVIRUS", "CSFOLI", "IGGCSF", "LABACHR", "ACETBL"   Inflammation (CRP: Acute  ESR: Chronic) Lab Results  Component Value Date   ESRSEDRATE 10 12/08/2018   LATICACIDVEN 1.9 04/23/2019     Rheumatology No results found for: "RF", "ANA", "LABURIC", "URICUR", "LYMEIGGIGMAB", "LYMEABIGMQN", "HLAB27"   Coagulation Lab Results   Component Value Date   INR 1.0 04/23/2019   LABPROT 12.6 04/23/2019   APTT 26 04/23/2019   PLT 318 02/07/2022     Cardiovascular Lab Results  Component Value Date   BNP 8.2 03/21/2021   CKTOTAL 105 04/23/2019   TROPONINI <0.03 06/16/2018   HGB 13.4 02/07/2022   HCT 42.4 02/07/2022     Screening Lab Results  Component Value Date   SARSCOV2NAA NEGATIVE 12/08/2019   STAPHAUREUS NEGATIVE 12/08/2019   MRSAPCR NEGATIVE 12/08/2019   HIV NON REACTIVE 04/24/2019     Cancer No results found for: "CEA", "CA125", "LABCA2"   Allergens No results found for: "ALMOND", "APPLE", "ASPARAGUS", "AVOCADO", "BANANA", "BARLEY", "BASIL", "BAYLEAF", "GREENBEAN", "LIMABEAN", "WHITEBEAN", "BEEFIGE", "REDBEET", "BLUEBERRY", "BROCCOLI", "CABBAGE", "MELON", "CARROT", "CASEIN", "CASHEWNUT", "CAULIFLOWER", "CELERY"     Note: Lab results reviewed.  PFSH  Drug: Mr. Ramsier  reports no history of drug use. Alcohol:  reports that he does not currently use alcohol. Tobacco:  reports that he quit smoking about 6 years ago. His smoking use included cigarettes and pipe. His smokeless tobacco use includes snuff. Medical:  has a past medical history of Arthritis, Back pain, CHF (congestive heart failure) (HCC), Chronic kidney disease, GERD (gastroesophageal reflux disease), H/O splenectomy, Hepatitis, High cholesterol, History of kidney stones, Hypertension, Peripheral vascular disease (HCC), and Plantar fasciitis. Family: family history includes Hypertension in his father.  Past Surgical History:  Procedure Laterality Date   HERNIA REPAIR  umbilical   JOINT REPLACEMENT     REVERSE SHOULDER ARTHROPLASTY Left 12/10/2019   Procedure: REVERSE SHOULDER ARTHROPLASTY;  Surgeon: Christena Flake, MD;  Location: [facility];  Service: Orthopedics;  Laterality: Left;   SPLENECTOMY     Age 67   TOTAL HIP ARTHROPLASTY Left 03/12/2017   Procedure: TOTAL HIP ARTHROPLASTY;  Surgeon: Christena Flake, MD;  Location: [facility];   Service: Orthopedics;  Laterality: Left;   TOTAL KNEE ARTHROPLASTY Right 12/11/2018   Procedure: TOTAL KNEE ARTHROPLASTY;  Surgeon: Christena Flake, MD;  Location: [facility];  Service: Orthopedics;  Laterality: Right;   Active Ambulatory Problems    Diagnosis Date Noted   Status post total hip replacement, left 03/12/2017   Primary osteoarthritis of right knee 05/08/2018   Morbid obesity with BMI of 40.0-44.9, adult (HCC) 12/28/2015   Chronic pain of left knee 06/05/2018   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 06/05/2018   Status post total knee replacement using cement, right 12/11/2018   Drug overdose 04/23/2019   Alcohol withdrawal syndrome with complication, with unspecified complication (HCC) 04/24/2019   Acute respiratory failure with hypoxia and hypercapnia (HCC) 04/24/2019   AKI (acute kidney injury) (HCC) 04/24/2019   Acute systolic CHF (congestive heart failure) (HCC) 04/28/2019   Essential hypertension    Opioid abuse (HCC)    Acute metabolic encephalopathy    Alcoholic cardiomyopathy (HCC)    Hypernatremia    Alcohol abuse 05/02/2019   Generalized weakness    Status post reverse arthroplasty of shoulder, left 12/10/2019   Chronic pain syndrome 08/29/2020   Primary osteoarthritis of left knee 08/29/2020   Resolved Ambulatory Problems    Diagnosis Date Noted   No Resolved Ambulatory Problems   Past Medical History:  Diagnosis Date   Arthritis    Back pain    CHF (congestive heart failure) (HCC)    Chronic kidney disease    GERD (gastroesophageal reflux disease)    H/O splenectomy    Hepatitis    High cholesterol    History of kidney stones    Hypertension    Peripheral vascular disease (HCC)    Plantar fasciitis    Constitutional Exam  General appearance: Well nourished, well developed, and well hydrated. In no apparent acute distress Vitals:   01/17/23 1319  BP: 107/61  Pulse: (!) 58  Temp: 98.1 F (36.7 C)  SpO2: 98%  Weight: 293 lb  (132.9 kg)  Height: 5\' 8"  (1.727 m)   BMI Assessment: Estimated body mass index is 44.55 kg/m as calculated from the following:   Height as of this encounter: 5\' 8"  (1.727 m).   Weight as of this encounter: 293 lb (132.9 kg).  BMI interpretation table: BMI level Category Range association with higher incidence of chronic pain  <18 kg/m2 Underweight   18.5-24.9 kg/m2 Ideal body weight   25-29.9 kg/m2 Overweight Increased incidence by 20%  30-34.9 kg/m2 Obese (Class I) Increased incidence by 68%  35-39.9 kg/m2 Severe obesity (Class II) Increased incidence by 136%  >40 kg/m2 Extreme obesity (Class III) Increased incidence by 254%   Patient's current BMI Ideal Body weight  Body mass index is 44.55 kg/m. Ideal body weight: 68.4 kg (150 lb 12.7 oz) Adjusted ideal body weight: 94.2 kg (207 lb 10.8 oz)   BMI Readings from Last 4 Encounters:  01/17/23 44.55 kg/m  12/06/22 44.55 kg/m  11/22/22 44.57 kg/m  04/24/22 43.33 kg/m   Wt Readings from Last 4 Encounters:  01/17/23 293 lb (  132.9 kg)  12/06/22 293 lb (132.9 kg)  11/22/22 293 lb 2 oz (133 kg)  04/24/22 285 lb (129.3 kg)    Psych/Mental status: Alert, oriented x 3 (person, place, & time)       Eyes: PERLA Respiratory: No evidence of acute respiratory distress   Low back pain Patient presented today in wheelchair.   Bilateral knee pain Assessment  Primary Diagnosis & Pertinent Problem List: The primary encounter diagnosis was Lumbar facet arthropathy. Diagnoses of Chronic bilateral low back pain without sciatica, Alcohol abuse, Generalized weakness, and Chronic pain syndrome were also pertinent to this visit.  Visit Diagnosis (New problems to examiner): 1. Lumbar facet arthropathy   2. Chronic bilateral low back pain without sciatica   3. Alcohol abuse   4. Generalized weakness   5. Chronic pain syndrome    Plan of Care   History of chronic pain, multiple pain generators and is on very high dose of opioid therapy  with an MME greater than 260.  Patient's chronic pain is likely related to opioid-induced hyperalgesia.  He complains of low back pain with MRI showing mild facet arthropathy and lumbar degenerative disc disease.  Discussed physical therapy and also peripheral nerve stimulation of the lumbar medial branch nerves.  Patient was somewhat rude during the encounter and midway through stated that he was leaving and wheeled himself out of the clinic.   Provider-requested follow-up: No follow-ups on file.  No future appointments.   Duration of encounter: .  Total time on encounter, as per AMA guidelines included both the face-to-face and non-face-to-face time personally spent by the physician and/or other qualified health care professional(s) on the day of the encounter (includes time in activities that require the physician or other qualified health care professional and does not include time in activities normally performed by clinical staff). Physician's time may include the following activities when performed: Preparing to see the patient (e.g., pre-charting review of records, searching for previously ordered imaging, lab work, and nerve conduction tests) Review of prior analgesic pharmacotherapies. Reviewing PMP Interpreting ordered tests (e.g., lab work, imaging, nerve conduction tests) Performing post-procedure evaluations, including interpretation of diagnostic procedures Obtaining and/or reviewing separately obtained history Performing a medically appropriate examination and/or evaluation Counseling and educating the patient/family/caregiver Ordering medications, tests, or procedures Referring and communicating with other health care professionals (when not separately reported) Documenting clinical information in the electronic or other health record Independently interpreting results (not separately reported) and communicating results to the patient/ family/caregiver Care  coordination (not separately reported)  Note by: Edward Jolly, MD (TTS technology used. I apologize for any typographical errors that were not detected and corrected.) Date: 01/17/2023; Time: 2:14 PM

## 2023-01-17 NOTE — Progress Notes (Signed)
Nursing Pain Medication Assessment:  Safety precautions to be maintained throughout the outpatient stay will include: orient to surroundings, keep bed in low position, maintain call bell within reach at all times, provide assistance with transfer out of bed and ambulation.  

## 2023-02-18 ENCOUNTER — Telehealth: Payer: Self-pay | Admitting: Cardiovascular Disease

## 2023-02-18 NOTE — Telephone Encounter (Signed)
That is fine from a cardiac standpoint. 

## 2023-02-18 NOTE — Telephone Encounter (Signed)
Patient wants to know if it is OK for him to donate blood.

## 2023-02-18 NOTE — Telephone Encounter (Signed)
Spoke to patient and informed him that Dr. Kirke Corin said "That is fine from a cardiac standpoint."   Patient understood with read back

## 2023-08-12 ENCOUNTER — Encounter: Payer: Self-pay | Admitting: Cardiovascular Disease

## 2023-08-12 NOTE — Telephone Encounter (Signed)
error 

## 2023-10-01 ENCOUNTER — Ambulatory Visit: Attending: Cardiovascular Disease | Admitting: Cardiovascular Disease

## 2023-10-01 ENCOUNTER — Encounter: Payer: Self-pay | Admitting: Cardiovascular Disease

## 2023-10-01 VITALS — BP 120/66 | HR 74 | Ht 68.0 in | Wt 281.0 lb

## 2023-10-01 DIAGNOSIS — E785 Hyperlipidemia, unspecified: Secondary | ICD-10-CM

## 2023-10-01 DIAGNOSIS — I10 Essential (primary) hypertension: Secondary | ICD-10-CM

## 2023-10-01 DIAGNOSIS — R072 Precordial pain: Secondary | ICD-10-CM

## 2023-10-01 DIAGNOSIS — I5022 Chronic systolic (congestive) heart failure: Secondary | ICD-10-CM

## 2023-10-01 MED ORDER — METOPROLOL TARTRATE 100 MG PO TABS: ORAL_TABLET | ORAL | 0 refills | Status: AC
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
## 2023-10-01 NOTE — Patient Instructions (Signed)
 Medication Instructions:  No changes *If you need a refill on your cardiac medications before your next appointment, please call your pharmacy*  Lab Work: Your provider would like for you to return in 1-2 weeks to have the following labs drawn: BMET.   Please go to [facility] [address] ([facility] [address], [place] [address] You do not need an appointment.  They are open from 8 am- 4:30 pm.  Lunch from 1:00 pm- 2:00 pm You will not need to be fasting.   You may also go to one of the following [facility]:  [address] [place], [place] [address] Phone: [phone] Lab hours: Mon-Fri 8 am- 5 pm    Lunch 12 pm- 1 pm  [address] [place],  [place]  [address]  [place]  Phone: [phone] Lab hours: 7 am- 4 pm Lunch 12 pm-1 pm   [address] [place],  [place]  [address]  [place]  Phone: [phone] Lab hours: Mon-Fri 8 am- 5 pm    Lunch 12 pm- 1 pm  If you have labs (blood work) drawn today and your tests are completely normal, you will receive your results only by: MyChart Message (if you have MyChart) OR A paper copy in the mail If you have any lab test that is abnormal or we need to change your treatment, we will call you to review the results.   Follow-Up: At [facility], you and your health needs are our priority.  As part of our continuing mission to provide you with exceptional heart care, our providers are all part of one team.  This team includes your primary Cardiologist (physician) and Advanced Practice Providers or APPs (Physician Assistants and Nurse Practitioners) who all work together to provide you with the care you need, when you need it.  Your next appointment:   2 month(s)  Provider:   Laneta Pintos, NP Gildardo Labrador, PA-C Varney Gentleman, PA-C Cadence Gennaro Khat, PA-C Ronald Cockayne, NP Morey Ar, NP    We recommend signing up for the patient portal called "MyChart".  Sign up information is provided on this After Visit  Summary.  MyChart is used to connect with patients for Virtual Visits (Telemedicine).  Patients are able to view lab/test results, encounter notes, upcoming appointments, etc.  Non-urgent messages can be sent to your provider as well.   To learn more about what you can do with MyChart, go to [email or web address].   Other Instructions   Your cardiac CT will be scheduled at one of the below locations:   [facility] [address] [place], [place] [address] [phone]  OR  [facility] [address] [place], [place] [address] [phone]  OR   [facility] [address] [place], [place] [address] [phone]  OR   [facility] [address] [place], [place] [address] [phone]  OR   [facility] and Vascular Tower [address]  [place], [place] [address] Opening September 09, 2023  If scheduled at [facility], please arrive at the [facility] and Children's Entrance (Entrance C2) of [facility] 30 minutes prior to test start time. You can use the FREE valet parking offered at entrance C (encouraged to control the heart rate for the test)  Proceed to the [facility] Radiology Department (first floor) to check-in and test prep.   All radiology patients and guests should use entrance C2 at [facility],  accessed from [facility], even though the hospital's physical address listed is [address]    If scheduled at the Heart and Vascular Tower at [address], please enter the parking lot using the [address] entrance and use the FREE valet service at the patient drop-off area. Enter the buidling and check-in with registration on the main floor.  If scheduled at [facility] or [facility], please arrive 15 mins early for check-in and test prep.  There is spacious  parking and easy access to the radiology department from the [facility] Heart and Vascular entrance. Please enter here and check-in with the desk attendant.   If scheduled at [facility], please arrive 30 minutes early for check-in and test prep.  Please follow these instructions carefully (unless otherwise directed):  An IV will be required for this test and Nitroglycerin  will be given.  Hold all erectile dysfunction medications at least 3 days (72 hrs) prior to test. (Ie viagra, cialis, sildenafil, tadalafil, etc)   On the Night Before the Test: Be sure to Drink plenty of water. Do not consume any caffeinated/decaffeinated beverages or chocolate 12 hours prior to your test. Do not take any antihistamines 12 hours prior to your test.  On the Day of the Test: Drink plenty of water until 1 hour prior to the test. Do not eat any food 1 hour prior to test. You may take your regular medications prior to the test.  Take metoprolol  (Lopressor ) two hours prior to test.   -hold the Carvedilol  that morning If you take Furosemide /Hydrochlorothiazide /Spironolactone /Chlorthalidone, please HOLD on the morning of the test. Patients who wear a continuous glucose monitor MUST remove the device prior to scanning.  After the Test: Drink plenty of water. After receiving IV contrast, you may experience a mild flushed feeling. This is normal. On occasion, you may experience a mild rash up to 24 hours after the test. This is not dangerous. If this occurs, you can take Benadryl  25 mg, Zyrtec, Claritin, or Allegra and increase your fluid intake. (Patients taking Tikosyn should avoid Benadryl , and may take Zyrtec, Claritin, or Allegra) If you experience trouble breathing, this can be serious. If it is severe call 911 IMMEDIATELY. If it is mild, please call our office.  We will call to schedule your test 2-4 weeks out understanding that some insurance companies will need an authorization prior to the service  being performed.   For more information and frequently asked questions, please visit our website : [email or web address]  For non-scheduling related questions, please contact the cardiac imaging nurse navigator should you have any questions/concerns: Cardiac Imaging Nurse Navigators Direct Office Dial: [phone]   For scheduling needs, including cancellations and rescheduling, please call [place], [phone].

## 2023-10-23 ENCOUNTER — Telehealth (HOSPITAL_COMMUNITY): Payer: Self-pay | Admitting: Emergency Medicine

## 2023-10-23 NOTE — Telephone Encounter (Signed)
 Attempted to call patient regarding upcoming cardiac CT appointment. Left message on voicemail with name and callback number Rockwell Alexandria RN Navigator Cardiac Imaging [facility] Heart [facility] Vascular Services [phone] Office [phone] Cell

## 2023-10-24 ENCOUNTER — Ambulatory Visit
Admission: RE | Admit: 2023-10-24 | Discharge: 2023-10-24 | Disposition: A | Discharge status: Home / Self Care | Source: Ambulatory Visit | Attending: Cardiovascular Disease | Admitting: Cardiovascular Disease

## 2023-10-24 DIAGNOSIS — R072 Precordial pain: Secondary | ICD-10-CM | POA: Insufficient documentation

## 2023-10-24 LAB — POCT I-STAT CREATININE: Creatinine, Ser: 0.9 mg/dL (ref 0.61–1.24)

## 2023-10-24 MED ORDER — DILTIAZEM HCL 25 MG/5ML IV SOLN
10.0000 mg | INTRAVENOUS | Status: DC | PRN
  Filled 2023-10-24: qty 5

## 2023-10-24 MED ORDER — NITROGLYCERIN 0.4 MG SL SUBL
0.8000 mg | SUBLINGUAL_TABLET | Freq: Once | SUBLINGUAL | Status: AC
  Administered 2023-10-24: 0.8 mg via SUBLINGUAL
  Filled 2023-10-24: qty 25

## 2023-10-24 MED ORDER — METOPROLOL TARTRATE 5 MG/5ML IV SOLN
INTRAVENOUS | Status: AC
  Filled 2023-10-24: qty 10

## 2023-10-24 MED ORDER — IOHEXOL 350 MG/ML SOLN
80.0000 mL | Freq: Once | INTRAVENOUS | Status: AC | PRN
  Administered 2023-10-24: 80 mL via INTRAVENOUS

## 2023-10-24 MED ORDER — METOPROLOL TARTRATE 5 MG/5ML IV SOLN
10.0000 mg | INTRAVENOUS | Status: DC | PRN
  Administered 2023-10-24: 5 mg via INTRAVENOUS
  Filled 2023-10-24: qty 10

## 2023-10-24 NOTE — Progress Notes (Signed)
 Patient tolerated procedure well. Ambulate w/o difficulty. Denies any lightheadedness or being dizzy. Pt denies any pain at this time. Sitting in chair. Pt is encouraged to drink additional water throughout the day and reason explained to patient. Patient verbalized understanding and all questions answered. ABC intact. No further needs at this time. Discharge from procedure area w/o issues.

## 2023-10-25 ENCOUNTER — Ambulatory Visit: Payer: Self-pay | Admitting: Cardiovascular Disease

## 2023-12-03 ENCOUNTER — Ambulatory Visit: Attending: Physician Assistant | Admitting: Physician Assistant

## 2023-12-03 ENCOUNTER — Encounter: Payer: Self-pay | Admitting: Physician Assistant

## 2023-12-03 VITALS — BP 98/62 | HR 75 | Ht 70.0 in | Wt 277.8 lb

## 2023-12-03 DIAGNOSIS — I428 Other cardiomyopathies: Secondary | ICD-10-CM

## 2023-12-03 DIAGNOSIS — I2510 Atherosclerotic heart disease of native coronary artery without angina pectoris: Secondary | ICD-10-CM | POA: Diagnosis not present

## 2023-12-03 DIAGNOSIS — I5032 Chronic diastolic (congestive) heart failure: Secondary | ICD-10-CM

## 2023-12-03 DIAGNOSIS — E785 Hyperlipidemia, unspecified: Secondary | ICD-10-CM

## 2023-12-03 DIAGNOSIS — I10 Essential (primary) hypertension: Secondary | ICD-10-CM

## 2023-12-03 NOTE — Patient Instructions (Signed)
 Medication Instructions:  Your physician recommends that you continue on your current medications as directed. Please refer to the Current Medication list given to you today.   *If you need a refill on your cardiac medications before your next appointment, please call your pharmacy*  Lab Work: None ordered at this time  If you have labs (blood work) drawn today and your tests are completely normal, you will receive your results only by: MyChart Message (if you have MyChart) OR A paper copy in the mail If you have any lab test that is abnormal or we need to change your treatment, we will call you to review the results.  Testing/Procedures: None ordered at this time   Follow-Up: At [facility], you and your health needs are our priority.  As part of our continuing mission to provide you with exceptional heart care, our providers are all part of one team.  This team includes your primary Cardiologist (physician) and Advanced Practice Providers or APPs (Physician Assistants and Nurse Practitioners) who all work together to provide you with the care you need, when you need it.  Your next appointment:   1 year(s)  Provider:   You may see Deatrice Cage, MD   We recommend signing up for the patient portal called MyChart.  Sign up information is provided on this After Visit Summary.  MyChart is used to connect with patients for Virtual Visits (Telemedicine).  Patients are able to view lab/test results, encounter notes, upcoming appointments, etc.  Non-urgent messages can be sent to your provider as well.   To learn more about what you can do with MyChart, go to [email or web address].
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
